# Patient Record
Sex: Male | Born: 1970 | Race: Black or African American | Hispanic: No | Marital: Single | Smoking: Current every day smoker
Health system: Southern US, Community
[De-identification: ages and names within clinical notes are randomized; demographics above are authoritative.]

## PROBLEM LIST (undated history)

## (undated) DIAGNOSIS — R569 Unspecified convulsions: Secondary | ICD-10-CM

## (undated) DIAGNOSIS — F1010 Alcohol abuse, uncomplicated: Secondary | ICD-10-CM

## (undated) DIAGNOSIS — F10939 Alcohol use, unspecified with withdrawal, unspecified: Secondary | ICD-10-CM

## (undated) DIAGNOSIS — F10239 Alcohol dependence with withdrawal, unspecified: Secondary | ICD-10-CM

## (undated) HISTORY — PX: TONSILLECTOMY: SUR1361

---

## 2001-03-16 ENCOUNTER — Emergency Department (HOSPITAL_COMMUNITY): Admission: EM | Admit: 2001-03-16 | Discharge: 2001-03-16 | Payer: Self-pay | Admitting: Emergency Medicine

## 2001-03-16 ENCOUNTER — Encounter: Payer: Self-pay | Admitting: Emergency Medicine

## 2011-11-29 ENCOUNTER — Encounter (HOSPITAL_COMMUNITY): Payer: Self-pay | Admitting: *Deleted

## 2011-11-29 ENCOUNTER — Emergency Department (HOSPITAL_COMMUNITY)

## 2011-11-29 ENCOUNTER — Emergency Department (HOSPITAL_COMMUNITY)
Admission: EM | Admit: 2011-11-29 | Discharge: 2011-11-29 | Disposition: A | Discharge status: Home / Self Care | Attending: Emergency Medicine | Admitting: Emergency Medicine

## 2011-11-29 DIAGNOSIS — F10239 Alcohol dependence with withdrawal, unspecified: Secondary | ICD-10-CM

## 2011-11-29 DIAGNOSIS — F10939 Alcohol use, unspecified with withdrawal, unspecified: Secondary | ICD-10-CM | POA: Insufficient documentation

## 2011-11-29 DIAGNOSIS — F17200 Nicotine dependence, unspecified, uncomplicated: Secondary | ICD-10-CM | POA: Insufficient documentation

## 2011-11-29 DIAGNOSIS — R569 Unspecified convulsions: Secondary | ICD-10-CM | POA: Insufficient documentation

## 2011-11-29 LAB — BASIC METABOLIC PANEL
BUN: 13 mg/dL (ref 6–23)
CO2: 28 mEq/L (ref 19–32)
Calcium: 9.2 mg/dL (ref 8.4–10.5)
Chloride: 98 mEq/L (ref 96–112)
Creatinine, Ser: 0.93 mg/dL (ref 0.50–1.35)
GFR calc Af Amer: >90 mL/min (ref >90)
GFR calc non Af Amer: >90 mL/min (ref >90)
Glucose, Bld: 106 mg/dL — ABNORMAL HIGH (ref 70–99)
Potassium: 3.6 mEq/L (ref 3.5–5.1)
Sodium: 134 mEq/L — ABNORMAL LOW (ref 135–145)

## 2011-11-29 MED ORDER — LORAZEPAM 1 MG PO TABS: ORAL_TABLET | ORAL | Status: DC

## 2011-11-29 MED ORDER — LORAZEPAM 1 MG PO TABS
ORAL_TABLET | ORAL | Status: AC
  Filled 2011-11-29: qty 1

## 2011-11-29 MED ORDER — LORAZEPAM 1 MG PO TABS
1.0000 mg | ORAL_TABLET | Freq: Once | ORAL | Status: AC
  Administered 2011-11-29: 1 mg via ORAL

## 2011-11-29 MED ORDER — LORAZEPAM 1 MG PO TABS
1.0000 mg | ORAL_TABLET | Freq: Once | ORAL | Status: AC
  Administered 2011-11-29: 1 mg via ORAL
  Filled 2011-11-29: qty 1

## 2011-11-29 NOTE — ED Notes (Signed)
Left in c/o officer for return transport to jail.  Alert, in no distress; verbalizes understanding of instructions given.

## 2011-11-29 NOTE — ED Notes (Signed)
Pt to department via EMS.  Sheriff's deputy accompanied patient.  Pt reports he had a seizure.   Per deputy, seizure was witnessed, and lasted approximately 60- 90 seconds.   Per pt the last time he had a seizure it was due to not having anything to drink.  Pt reports this was in December.  Reports drinking "about a 12 pack" per day.  Last time he drank was 2 days ago.

## 2011-11-29 NOTE — Discharge Instructions (Signed)
Please take the medications exactly as prescribed, return to the hospital for recurrent seizures or any change in mental status including confusion or hallucinations.  You should take the Ativan tablet every 12 hours for 2 days, then half of a tablet every 12 hours for another 2 days, then half of a tablet as needed for anxiety, tremor, sweating or alcohol withdrawal

## 2011-11-29 NOTE — ED Provider Notes (Signed)
History     CSN: [id number]  Arrival date & time 11/29/11  0018   First MD Initiated Contact with Patient 11/29/11 0055      Chief Complaint  Patient presents with  . Seizures    (Consider location/radiation/quality/duration/timing/severity/associated sxs/prior treatment) HPI Comments: Currently the patient is in jail, there was a reported seizure this evening. The patient does not remember having seizure activity but was witnessed by the [facility]. According to the [facility] this was tonic clonic, resolve spontaneously after approximately 60-90 seconds and the patient had some post seizure confusion. He states that he is a heavy drinker, approximately 12+ alcoholic beverages per day usually beers. He has not had anything to drink in the last 2 days because he has been incarcerated. He states that yesterday he was having the shakes and felt like he was in withdrawal, this has improved today but he had a spontaneous seizure this evening. There was no tongue biting, no urinary incontinence, seizure resolved spontaneously. He does admit to having a seizure in the past which was thought to be alcohol withdrawal as well.  Prior to the seizure the patient denies any headache, numbness, weakness, slurred speech, chest pain, abdominal pain, palpitations, swelling, rashes, diarrhea or dysuria.  Patient is a 41 y.o. male presenting with seizures. The history is provided by the patient and the police.  Seizures     Past Medical History  Diagnosis Date  . Seizures     History reviewed. No pertinent past surgical history.  History reviewed. No pertinent family history.  History  Substance Use Topics  . Smoking status: Current Everyday Smoker -- 1.0 packs/day  . Smokeless tobacco: Not on file  . Alcohol Use: 7.2 oz/week    12 Cans of beer per week     reprots 12 pack per day      Review of Systems  Neurological: Positive for seizures.  All other systems reviewed and are  negative.    Allergies  Review of patient's allergies indicates no known allergies.  Home Medications   Current Outpatient Rx  Name Route Sig Dispense Refill  . LORAZEPAM 1 MG PO TABS  Take 1 tablet by mouth every 12 hours for 2 days, take half a tablet by mouth every 12 hours for 2 days, then take a half a tablet as needed for anxiety, tremor or alcohol withdrawal 10 tablet 0    BP 145/87  Pulse 68  Temp(Src) 98.2 F (36.8 C) (Oral)  Resp 16  Ht 5\' 6"  (1.676 m)  Wt 147 lb (66.679 kg)  BMI 23.73 kg/m2  SpO2 98%  Physical Exam  Nursing note and vitals reviewed. Constitutional: He appears well-developed and well-nourished. No distress.  HENT:  Head: Normocephalic and atraumatic.  Mouth/Throat: Oropharynx is clear and moist. No oropharyngeal exudate.  Eyes: Conjunctivae and EOM are normal. Pupils are equal, round, and reactive to light. Right eye exhibits no discharge. Left eye exhibits no discharge. No scleral icterus.  Neck: Normal range of motion. Neck supple. No JVD present. No thyromegaly present.  Cardiovascular: Normal rate, regular rhythm, normal heart sounds and intact distal pulses.  Exam reveals no gallop and no friction rub.   No murmur heard. Pulmonary/Chest: Effort normal and breath sounds normal. No respiratory distress. He has no wheezes. He has no rales.  Abdominal: Soft. Bowel sounds are normal. He exhibits no distension and no mass. There is no tenderness.  Musculoskeletal: Normal range of motion. He exhibits no edema and no tenderness.  Lymphadenopathy:  He has no cervical adenopathy.  Neurological: He is alert. Coordination normal.       Normal speech, sensation, motor, limb movements, no ataxia. Normal pupillary exam, normal extraocular movements.  Skin: Skin is warm and dry. No rash noted. No erythema.  Psychiatric: He has a normal mood and affect. His behavior is normal.    ED Course  Procedures (including critical care time)  Labs Reviewed   BASIC METABOLIC PANEL - Abnormal; Notable for the following:    Sodium 134 (*)    Glucose, Bld 106 (*)    All other components within normal limits   No results found.   1. Alcohol withdrawal seizure       MDM  No signs of trauma, evidently the patient had rolled off of his mattress which is only several inches off the floor to the ground where he had a shaking spell. His mental status is normal, neurologic exam is normal, vital signs are normal. Proceed with CT scan as there is no documented history of the CT scan of the medical record in the last 11 years. Basic metabolic panel pending, Ativan given by mouth. I suspect this was alcohol withdrawal in nature.  CT scan reviewed by myself, no acute findings to suggest another source for his seizure. Basic metabolic panel is normal, medications have been given and no further seizures have been seen. There is no signs of delirium, altered mental status confusion or disorientation to suggest delirium tremens. He no longer has any tremor, sweating or anxiety. Will treat with short course of benzodiazepine taper, followup as needed.  Well-appearing, no further seizures, vital signs normal, prescription for Ativan given.  Discharged in the care of the [facility], MD 11/29/11 [phone]

## 2011-11-29 NOTE — ED Notes (Signed)
Hx of ETOH abuse-states drinks approx 12 beers daily; has been incarcerated x 2 days; reports hx of seizures, last one in December.  States cannot remember if that seizure was r/t ETOH withdrawal. Alert, oriented x 4; answers questions appropriately.

## 2013-09-06 ENCOUNTER — Encounter (HOSPITAL_COMMUNITY): Payer: Self-pay | Admitting: Emergency Medicine

## 2013-09-06 ENCOUNTER — Emergency Department (HOSPITAL_COMMUNITY)

## 2013-09-06 ENCOUNTER — Other Ambulatory Visit: Payer: Self-pay

## 2013-09-06 ENCOUNTER — Emergency Department (HOSPITAL_COMMUNITY)
Admission: EM | Admit: 2013-09-06 | Discharge: 2013-09-06 | Disposition: A | Discharge status: Home / Self Care | Attending: Emergency Medicine | Admitting: Emergency Medicine

## 2013-09-06 DIAGNOSIS — G40909 Epilepsy, unspecified, not intractable, without status epilepticus: Secondary | ICD-10-CM | POA: Insufficient documentation

## 2013-09-06 DIAGNOSIS — Z79899 Other long term (current) drug therapy: Secondary | ICD-10-CM | POA: Insufficient documentation

## 2013-09-06 DIAGNOSIS — R55 Syncope and collapse: Secondary | ICD-10-CM | POA: Insufficient documentation

## 2013-09-06 DIAGNOSIS — R61 Generalized hyperhidrosis: Secondary | ICD-10-CM | POA: Insufficient documentation

## 2013-09-06 DIAGNOSIS — F17200 Nicotine dependence, unspecified, uncomplicated: Secondary | ICD-10-CM | POA: Insufficient documentation

## 2013-09-06 DIAGNOSIS — R079 Chest pain, unspecified: Secondary | ICD-10-CM

## 2013-09-06 DIAGNOSIS — R0789 Other chest pain: Secondary | ICD-10-CM | POA: Insufficient documentation

## 2013-09-06 LAB — CBC WITH DIFFERENTIAL/PLATELET
Eosinophils Relative: 3 % (ref 0–5)
HCT: 41.5 % (ref 39.0–52.0)
Lymphocytes Relative: 37 % (ref 12–46)
Lymphs Abs: 1 10*3/uL (ref 0.7–4.0)
MCH: 32 pg (ref 26.0–34.0)
MCHC: 34 g/dL (ref 30.0–36.0)
MCV: 94.1 fL (ref 78.0–100.0)
Monocytes Absolute: 0.6 10*3/uL (ref 0.1–1.0)
Platelets: 169 10*3/uL (ref 150–400)
RBC: 4.41 MIL/uL (ref 4.22–5.81)
WBC: 2.7 10*3/uL — ABNORMAL LOW (ref 4.0–10.5)

## 2013-09-06 LAB — COMPREHENSIVE METABOLIC PANEL
ALT: 81 U/L — ABNORMAL HIGH (ref 0–53)
AST: 112 U/L — ABNORMAL HIGH (ref 0–37)
Alkaline Phosphatase: 91 U/L (ref 39–117)
CO2: 26 mEq/L (ref 19–32)
Calcium: 9.5 mg/dL (ref 8.4–10.5)
Creatinine, Ser: 0.77 mg/dL (ref 0.50–1.35)
GFR calc Af Amer: >90 mL/min (ref >90)
GFR calc non Af Amer: >90 mL/min (ref >90)
Glucose, Bld: 88 mg/dL (ref 70–99)
Potassium: 3.8 mEq/L (ref 3.5–5.1)
Total Bilirubin: 0.9 mg/dL (ref 0.3–1.2)

## 2013-09-06 NOTE — ED Notes (Signed)
Patient on librium and dilantin in correctional facility. Patient states last thing he remembers is going to cell door and asking for help due to chest pain. Officer states seizure like activity. Patient states is a heavy drinker (daily consumption- liquor and beer) and has not had any ETOH since Christmas Day.

## 2013-09-06 NOTE — ED Notes (Signed)
Patient brought in via EMS. Alert and oriented. EMS called out for seizure. Patient unaware of seizure. Per EMS no signs of seizures and no one from prison gave account of seizure. Patient detox from alcohol-per patient last drink Christmas day. Patient c/o left sided chest pain that started yesterday, per patient occasional radiates into left leg. Denies any cardiac hx.

## 2013-09-06 NOTE — ED Notes (Signed)
Report given to office with patient due to nurse not being at work until Monday. Officer and patient verbalizes understanding. Patient in nad

## 2013-09-06 NOTE — ED Provider Notes (Signed)
CSN: [id number]     Arrival date & time 09/06/13  1512 History  This chart was scribed for Benny Lennert, MD by Clydene Laming, ED Scribe. This patient was seen in room APA07/APA07 and the patient's care was started at 4:10 PM.   Chief Complaint  Patient presents with  . Seizures  . Chest Pain    Patient is a 42 y.o. male presenting with chest pain. The history is provided by the patient. No language interpreter was used.  Chest Pain Pain location:  L chest Pain quality: tightness   Pain radiates to:  Does not radiate Pain radiates to the back: no   Pain severity:  Mild Onset quality:  Sudden Duration:  2 days Timing:  Intermittent Progression:  Worsening Chronicity:  New Associated symptoms: no abdominal pain, no back pain, no cough, no fatigue and no headache    HPI Comments: Terry Clark is a 42 y.o. male brought in by ambulance, who presents to the Emergency Department complaining of intermittent, worsening left sided chest pain onset yesterday. Pt states the episodes last 10-15 seconds occur every 5-10 minutes. The chest tightnesss led to an episode of syncope which pt can net specify the duration of. Pt states he has been "sweating all day". EMS were called out for patients seizure episode. Patient was unaware of seizure. Per EMS no signs of seizures and no one from prison gave account of seizure. Patient detox from alcohol-per patient last drink Christmas day. Pt has medical hx of seizures.     Past Medical History  Diagnosis Date  . Seizures    Past Surgical History  Procedure Laterality Date  . Tonsillectomy     No family history on file. History  Substance Use Topics  . Smoking status: Current Every Day Smoker -- 1.00 packs/day  . Smokeless tobacco: Not on file  . Alcohol Use: 7.2 oz/week    12 Cans of beer per week     Comment: reprots 12 pack per day    Review of Systems  Constitutional: Negative for appetite change and fatigue.  HENT: Negative for  congestion, ear discharge and sinus pressure.   Eyes: Negative for discharge.  Respiratory: Negative for cough.   Cardiovascular: Positive for chest pain.  Gastrointestinal: Negative for abdominal pain and diarrhea.  Genitourinary: Negative for frequency and hematuria.  Musculoskeletal: Negative for back pain.  Skin: Negative for rash.  Neurological: Positive for seizures and syncope. Negative for headaches.  Psychiatric/Behavioral: Negative for hallucinations.    Allergies  Review of patient's allergies indicates no known allergies.  Home Medications   Current Outpatient Rx  Name  Route  Sig  Dispense  Refill  . chlordiazePOXIDE (LIBRIUM) 25 MG capsule   Oral   Take 50 mg by mouth 2 (two) times daily.         . Multiple Vitamin (MULTIVITAMIN WITH MINERALS) TABS tablet   Oral   Take 1 tablet by mouth daily.         . phenytoin (DILANTIN) 300 MG ER capsule   Oral   Take 300 mg by mouth at bedtime.         . thiamine 100 MG tablet   Oral   Take 100 mg by mouth daily.          BP 123/75  Pulse 62  Temp(Src) 98.6 F (37 C) (Oral)  Resp 18  SpO2 99% Physical Exam  Constitutional: He is oriented to person, place, and time. He appears well-developed.  HENT:  Head: Normocephalic.  Eyes: Conjunctivae and EOM are normal. No scleral icterus.  Neck: Neck supple. No thyromegaly present.  Cardiovascular: Normal rate and regular rhythm.  Exam reveals no gallop and no friction rub.   No murmur heard. Pulmonary/Chest: No stridor. He has no wheezes. He has no rales. He exhibits no tenderness.  Abdominal: He exhibits no distension. There is no tenderness. There is no rebound.  Musculoskeletal: Normal range of motion. He exhibits no edema.  Lymphadenopathy:    He has no cervical adenopathy.  Neurological: He is oriented to person, place, and time. He exhibits normal muscle tone. Coordination normal.  Skin: No rash noted. No erythema.  Psychiatric: He has a normal mood and  affect. His behavior is normal.    ED Course  Procedures (including critical care time) DIAGNOSTIC STUDIES: Oxygen Saturation is 99% on RA, normal by my interpretation.    COORDINATION OF CARE: 4:15 PM- Discussed treatment plan with pt at bedside. Pt verbalized understanding and agreement with plan.   Labs Review Labs Reviewed - No data to display Imaging Review No results found.  EKG Interpretation   None       MDM  No diagnosis found. I personally performed the services described in this documentation, which was scribed in my presence. The recorded information has been reviewed and is accurate.     Benny Lennert, MD 09/06/13 [id number]

## 2014-06-13 ENCOUNTER — Encounter (HOSPITAL_COMMUNITY): Payer: Self-pay | Admitting: Emergency Medicine

## 2014-06-13 ENCOUNTER — Inpatient Hospital Stay (HOSPITAL_COMMUNITY)
Admission: EM | Admit: 2014-06-13 | Discharge: 2014-06-14 | DRG: 101 | Disposition: A | Discharge status: Home / Self Care | Attending: Internal Medicine | Admitting: Internal Medicine

## 2014-06-13 DIAGNOSIS — R569 Unspecified convulsions: Principal | ICD-10-CM | POA: Diagnosis present

## 2014-06-13 DIAGNOSIS — K7010 Alcoholic hepatitis without ascites: Secondary | ICD-10-CM | POA: Diagnosis not present

## 2014-06-13 DIAGNOSIS — R112 Nausea with vomiting, unspecified: Secondary | ICD-10-CM | POA: Diagnosis present

## 2014-06-13 DIAGNOSIS — R51 Headache: Secondary | ICD-10-CM | POA: Diagnosis present

## 2014-06-13 DIAGNOSIS — F10230 Alcohol dependence with withdrawal, uncomplicated: Secondary | ICD-10-CM

## 2014-06-13 DIAGNOSIS — F17210 Nicotine dependence, cigarettes, uncomplicated: Secondary | ICD-10-CM | POA: Diagnosis present

## 2014-06-13 DIAGNOSIS — K5900 Constipation, unspecified: Secondary | ICD-10-CM | POA: Diagnosis present

## 2014-06-13 DIAGNOSIS — F1290 Cannabis use, unspecified, uncomplicated: Secondary | ICD-10-CM | POA: Diagnosis present

## 2014-06-13 DIAGNOSIS — F10930 Alcohol use, unspecified with withdrawal, uncomplicated: Secondary | ICD-10-CM

## 2014-06-13 DIAGNOSIS — F10231 Alcohol dependence with withdrawal delirium: Secondary | ICD-10-CM | POA: Diagnosis present

## 2014-06-13 DIAGNOSIS — F10939 Alcohol use, unspecified with withdrawal, unspecified: Secondary | ICD-10-CM | POA: Diagnosis present

## 2014-06-13 DIAGNOSIS — F10239 Alcohol dependence with withdrawal, unspecified: Secondary | ICD-10-CM | POA: Diagnosis present

## 2014-06-13 LAB — CBC WITH DIFFERENTIAL/PLATELET
BASOS ABS: 0 10*3/uL (ref 0.0–0.1)
Basophils Relative: 0 % (ref 0–1)
EOS ABS: 0 10*3/uL (ref 0.0–0.7)
EOS PCT: 0 % (ref 0–5)
HCT: 37.2 % — ABNORMAL LOW (ref 39.0–52.0)
Hemoglobin: 12.9 g/dL — ABNORMAL LOW (ref 13.0–17.0)
Lymphocytes Relative: 9 % — ABNORMAL LOW (ref 12–46)
Lymphs Abs: 0.4 10*3/uL — ABNORMAL LOW (ref 0.7–4.0)
MCH: 30.9 pg (ref 26.0–34.0)
MCHC: 34.7 g/dL (ref 30.0–36.0)
MCV: 89 fL (ref 78.0–100.0)
Monocytes Absolute: 1 10*3/uL (ref 0.1–1.0)
Monocytes Relative: 24 % — ABNORMAL HIGH (ref 3–12)
NEUTROS PCT: 67 % (ref 43–77)
Neutro Abs: 2.7 10*3/uL (ref 1.7–7.7)
PLATELETS: 123 10*3/uL — AB (ref 150–400)
RBC: 4.18 MIL/uL — ABNORMAL LOW (ref 4.22–5.81)
RDW: 14.2 % (ref 11.5–15.5)
WBC: 4.1 10*3/uL (ref 4.0–10.5)

## 2014-06-13 LAB — COMPREHENSIVE METABOLIC PANEL
ALT: 156 U/L — AB (ref 0–53)
AST: 305 U/L — AB (ref 0–37)
Albumin: 3.7 g/dL (ref 3.5–5.2)
Alkaline Phosphatase: 205 U/L — ABNORMAL HIGH (ref 39–117)
Anion gap: 19 — ABNORMAL HIGH (ref 5–15)
BILIRUBIN TOTAL: 0.5 mg/dL (ref 0.3–1.2)
BUN: 12 mg/dL (ref 6–23)
CHLORIDE: 92 meq/L — AB (ref 96–112)
CO2: 25 mEq/L (ref 19–32)
CREATININE: 0.62 mg/dL (ref 0.50–1.35)
Calcium: 9.1 mg/dL (ref 8.4–10.5)
GFR calc non Af Amer: >90 mL/min (ref >90)
GLUCOSE: 128 mg/dL — AB (ref 70–99)
Potassium: 3.7 mEq/L (ref 3.7–5.3)
Sodium: 136 mEq/L — ABNORMAL LOW (ref 137–147)
Total Protein: 8.1 g/dL (ref 6.0–8.3)

## 2014-06-13 MED ORDER — LORAZEPAM 1 MG PO TABS: 0.0000 mg | ORAL_TABLET | Freq: Two times a day (BID) | ORAL | Status: DC

## 2014-06-13 MED ORDER — DOCUSATE SODIUM 100 MG PO CAPS: 100.0000 mg | ORAL_CAPSULE | Freq: Every day | ORAL | Status: DC | PRN

## 2014-06-13 MED ORDER — ONDANSETRON HCL 4 MG/2ML IJ SOLN: 4.0000 mg | Freq: Four times a day (QID) | INTRAMUSCULAR | Status: DC | PRN

## 2014-06-13 MED ORDER — SODIUM CHLORIDE 0.9 % IV SOLN
Freq: Once | INTRAVENOUS | Status: AC
Start: 2014-06-13 — End: 2014-06-13
  Administered 2014-06-13: 14:00:00 via INTRAVENOUS

## 2014-06-13 MED ORDER — LORAZEPAM 2 MG/ML IJ SOLN
1.0000 mg | Freq: Four times a day (QID) | INTRAMUSCULAR | Status: DC | PRN
  Administered 2014-06-13: 1 mg via INTRAVENOUS
  Filled 2014-06-13: qty 1

## 2014-06-13 MED ORDER — ONDANSETRON HCL 4 MG/2ML IJ SOLN
4.0000 mg | Freq: Once | INTRAMUSCULAR | Status: AC
  Administered 2014-06-13: 4 mg via INTRAVENOUS
  Filled 2014-06-13: qty 2

## 2014-06-13 MED ORDER — SODIUM CHLORIDE 0.9 % IJ SOLN
3.0000 mL | Freq: Two times a day (BID) | INTRAMUSCULAR | Status: DC
  Administered 2014-06-13 – 2014-06-14 (×2): 3 mL via INTRAVENOUS

## 2014-06-13 MED ORDER — LORAZEPAM 1 MG PO TABS: 1.0000 mg | ORAL_TABLET | Freq: Four times a day (QID) | ORAL | Status: DC | PRN

## 2014-06-13 MED ORDER — ACETAMINOPHEN 325 MG PO TABS: 650.0000 mg | ORAL_TABLET | Freq: Four times a day (QID) | ORAL | Status: DC | PRN

## 2014-06-13 MED ORDER — ONDANSETRON HCL 4 MG PO TABS: 4.0000 mg | ORAL_TABLET | Freq: Four times a day (QID) | ORAL | Status: DC | PRN

## 2014-06-13 MED ORDER — FOLIC ACID 1 MG PO TABS
1.0000 mg | ORAL_TABLET | Freq: Every day | ORAL | Status: DC
  Administered 2014-06-14: 1 mg via ORAL
  Filled 2014-06-13: qty 1

## 2014-06-13 MED ORDER — ALUM & MAG HYDROXIDE-SIMETH 200-200-20 MG/5ML PO SUSP: 30.0000 mL | Freq: Four times a day (QID) | ORAL | Status: DC | PRN

## 2014-06-13 MED ORDER — ENOXAPARIN SODIUM 40 MG/0.4ML ~~LOC~~ SOLN
40.0000 mg | SUBCUTANEOUS | Status: DC
  Administered 2014-06-13: 40 mg via SUBCUTANEOUS
  Filled 2014-06-13: qty 0.4

## 2014-06-13 MED ORDER — ACETAMINOPHEN 650 MG RE SUPP: 650.0000 mg | Freq: Four times a day (QID) | RECTAL | Status: DC | PRN

## 2014-06-13 MED ORDER — THIAMINE HCL 100 MG/ML IJ SOLN: 100.0000 mg | Freq: Every day | INTRAMUSCULAR | Status: DC

## 2014-06-13 MED ORDER — LORAZEPAM 1 MG PO TABS
0.0000 mg | ORAL_TABLET | Freq: Four times a day (QID) | ORAL | Status: DC
  Administered 2014-06-13 – 2014-06-14 (×3): 1 mg via ORAL
  Filled 2014-06-13 (×3): qty 1

## 2014-06-13 MED ORDER — ADULT MULTIVITAMIN W/MINERALS CH
1.0000 | ORAL_TABLET | Freq: Every day | ORAL | Status: DC
  Administered 2014-06-14: 1 via ORAL
  Filled 2014-06-13: qty 1

## 2014-06-13 MED ORDER — VITAMIN B-1 100 MG PO TABS
100.0000 mg | ORAL_TABLET | Freq: Every day | ORAL | Status: DC
  Administered 2014-06-14: 100 mg via ORAL
  Filled 2014-06-13: qty 1

## 2014-06-13 MED ORDER — LORAZEPAM 1 MG PO TABS
1.0000 mg | ORAL_TABLET | Freq: Once | ORAL | Status: AC
  Administered 2014-06-13: 1 mg via ORAL
  Filled 2014-06-13: qty 1

## 2014-06-13 NOTE — ED Provider Notes (Signed)
CSN: [id number]     Arrival date & time 06/13/14  1332 History  This chart was scribed for Mirian Mo, MD by Leone Payor, ED Scribe. This patient was seen in room APA07/APA07 and the patient's care was started 1:58 PM.    Chief Complaint  Patient presents with  . Seizures  . Delirium Tremens (DTS)    The history is provided by the patient. No language interpreter was used.    HPI Comments: Terry Clark is a 43 y.o. male who presents from jail to the Emergency Department complaining of a seizure that occurred earlier today. Patient states he was laying down on a bed when he began feeling unwell. He got up to call someone and the next thing he recalls is waking up on the floor. He reports having a HA, abdominal pain, chin pain upon waking. He reports current nausea and an episode of vomiting in the ED. Patient states he usually drinks three 12-pack of beer daily with his last drinking being yesterday afternoon. He denies having a history of seizures aside from those related to alcohol consumption. He denies diarrhea.    Past Medical History  Diagnosis Date  . Seizures    Past Surgical History  Procedure Laterality Date  . Tonsillectomy     History reviewed. No pertinent family history. History  Substance Use Topics  . Smoking status: Current Every Day Smoker -- 1.00 packs/day  . Smokeless tobacco: Not on file  . Alcohol Use: 7.2 oz/week    12 Cans of beer per week     Comment: reprots 12 pack per day    Review of Systems  Constitutional: Negative for fever.  HENT:       +chin pain  Gastrointestinal: Positive for nausea, vomiting and constipation. Negative for diarrhea.  Neurological: Positive for headaches.  All other systems reviewed and are negative.     Allergies  Review of patient's allergies indicates no known allergies.  Home Medications   Prior to Admission medications   Not on File   BP 137/86  Pulse 84  Temp(Src) 98.2 F (36.8 C) (Oral)  Resp 19   SpO2 96% Physical Exam  Nursing note and vitals reviewed. Constitutional: He is oriented to person, place, and time. He appears well-developed and well-nourished.  HENT:  Head: Normocephalic and atraumatic.  Very superficial lacerations to the bilateral edges of tongue  Cardiovascular: Normal rate, regular rhythm and normal heart sounds.  Exam reveals no gallop and no friction rub.   No murmur heard. Pulmonary/Chest: Effort normal and breath sounds normal. No respiratory distress. He has no wheezes. He has no rales.  Abdominal: Soft. Bowel sounds are normal. He exhibits no distension and no mass. There is no tenderness. There is no rebound and no guarding.  Neurological: He is alert and oriented to person, place, and time. He has normal reflexes. He displays normal reflexes. No cranial nerve deficit. He exhibits normal muscle tone. Coordination normal.  Skin: Skin is warm and dry.  Psychiatric: He has a normal mood and affect.    ED Course  Procedures (including critical care time)  DIAGNOSTIC STUDIES: Oxygen Saturation is 100% on RA, normal by my interpretation.    COORDINATION OF CARE: 2:03 PM Discussed treatment plan with pt at bedside and pt agreed to plan.   Labs Review Labs Reviewed  COMPREHENSIVE METABOLIC PANEL - Abnormal; Notable for the following:    Sodium 136 (*)    Chloride 92 (*)    Glucose, Bld 128 (*)  AST 305 (*)    ALT 156 (*)    Alkaline Phosphatase 205 (*)    Anion gap 19 (*)    All other components within normal limits  CBC WITH DIFFERENTIAL - Abnormal; Notable for the following:    RBC 4.18 (*)    Hemoglobin 12.9 (*)    HCT 37.2 (*)    Platelets 123 (*)    Lymphocytes Relative 9 (*)    Lymphs Abs 0.4 (*)    Monocytes Relative 24 (*)    All other components within normal limits    Imaging Review No results found.   EKG Interpretation None      MDM   Final diagnoses:  Alcohol withdrawal seizure, uncomplicated    43 y.o. male with  pertinent PMH of prior withdrawal seizures presents with recurrent EtOH withdrawal seizure.  Patient drinks approximately 36 beers per day, has not had any alcohol in 24 hours. He had a witnessed seizure lasting 5-10 minutes per police staff.  This was described as generalized with loss of consciousness and followed by a postictal period.  On arrival today vitals signs and physical exam as above. Patient has mild fasciculations and tremor. No tachycardia. He was given Ativan. I spoke with jail staff who state that they're unable to administer medications until Monday.  Consult hospitalist for admission.  1. Alcohol withdrawal seizure, uncomplicated           Mirian MoMatthew Ilze Roselli, MD 06/13/14 1557

## 2014-06-13 NOTE — ED Notes (Signed)
Drinks 3 12 pack of beer per day.  Seizure this am witness for 15 minutes.  [facility]   Having vomiting at this time.

## 2014-06-13 NOTE — H&P (Signed)
History and Physical  Terry Clark:[id number] DOB: [birth year] DOA: 06/13/2014  Referring physician: Dr Littie Deeds, ED physician PCP: Default, Provider, MD   Chief Complaint: Seizure  HPI: Terry Clark is a 43 y.o. male  With a history of alcohol abuse, reportedly drinking and 36 beers a night with his last drink sometime around noon yesterday. The patient was taken into custody at the jail yesterday afternoon due to violation of parole and has been without alcohol since that time. The patient states that he was starting to feel sick and was trying to get some of the tension when he had a loss of consciousness. She was found by jail staff with generalized seizure activity. He was transported to the hospital for evaluation. The ER conversations with the jail staff related to inability to treat him as an outpatient. The jail status related that they did not have a nurse to administer medications for weekend.   Review of Systems:   Pt complains of nausea, vomiting, tremors, anxiousness.  Pt denies any fevers, chills, shortness of breath, palpitations, chest pain, abdominal pain.  Review of systems are otherwise negative  Past Medical History  Diagnosis Date  . Seizures    Past Surgical History  Procedure Laterality Date  . Tonsillectomy     Social History:  reports that he has been smoking.  He does not have any smokeless tobacco history on file. He reports that he drinks about 151.2 ounces of alcohol per week. He reports that he uses illicit drugs (Marijuana). Patient lives at home & is able to participate in activities of daily living  No Known Allergies  History reviewed. No pertinent family history.    Prior to Admission medications   Not on File    Physical Exam: BP 137/86  Pulse 84  Temp(Src) 98.2 F (36.8 C) (Oral)  Resp 19  SpO2 96%  General: Middle-aged white male. Awake and alert and oriented x3. No acute cardiopulmonary distress.  Eyes: Pupils equal,  round, reactive to light. Extraocular muscles are intact. Sclerae anicteric and noninjected.  ENT:  Moist mucosal membranes. Hemostatic lacerations noted on the sides of tongue.  No mucosal lesions. Teeth in moderate repair  Neck: Neck supple without lymphadenopathy. No carotid bruits. No masses palpated.  Cardiovascular: Regular rate with normal S1-S2 sounds. No murmurs, rubs, gallops auscultated. No JVD.  Respiratory: Good respiratory effort with no wheezes, rales, rhonchi. Lungs clear to auscultation bilaterally.  Abdomen: Soft, nontender, nondistended. Active bowel sounds. No masses or hepatosplenomegaly  Skin: Dry, warm to touch. 2+ dorsalis pedis and radial pulses. Musculoskeletal: No calf or leg pain. All major joints not erythematous nontender.  Psychiatric: Intact judgment and insight.  Neurologic: Intention tremor, as well as resting tremor. Some fasciculations noted in the arms. Cranial nerves II through XII are grossly intact.           Labs on Admission:  Basic Metabolic Panel:  Recent Labs Lab 06/13/14 1340  NA 136*  K 3.7  CL 92*  CO2 25  GLUCOSE 128*  BUN 12  CREATININE 0.62  CALCIUM 9.1   Liver Function Tests:  Recent Labs Lab 06/13/14 1340  AST 305*  ALT 156*  ALKPHOS 205*  BILITOT 0.5  PROT 8.1  ALBUMIN 3.7   No results found for this basename: LIPASE, AMYLASE,  in the last 168 hours No results found for this basename: AMMONIA,  in the last 168 hours CBC:  Recent Labs Lab 06/13/14 1340  WBC 4.1  NEUTROABS 2.7  HGB 12.9*  HCT 37.2*  MCV 89.0  PLT 123*   Cardiac Enzymes: No results found for this basename: CKTOTAL, CKMB, CKMBINDEX, TROPONINI,  in the last 168 hours  BNP (last 3 results) No results found for this basename: PROBNP,  in the last 8760 hours CBG: No results found for this basename: GLUCAP,  in the last 168 hours  Radiological Exams on Admission: No results found.    Assessment/Plan Present on Admission:  . Alcohol  withdrawal seizure . Alcoholic hepatitis  #1 alcohol withdrawal seizure Admit with telemetry. We'll give the patient scheduled Ativan due to acute withdrawal, as well as when necessary medications. We'll continue to give the patient symptomatic relief with Zofran. We'll give the patient thiamine and folic acid. I did discuss with the patient's regarding the need to quit, particularly as the patient has alcoholic hepatitis.  #2 alcohol hepatitis  DVT prophylaxis: Lovenox  Consultants: None  Code Status: Full  Family Communication: None   Disposition Plan: Return to jail after treatment  Time spent: 50 minutes  Candelaria CelesteJacob Stinson, DO [facility] Pager [phone]  **Disclaimer: This note may have been dictated with voice recognition software. Similar sounding words can inadvertently be transcribed and this note may contain transcription errors which may not have been corrected upon publication of note.**

## 2014-06-13 NOTE — ED Notes (Signed)
MD at bedside. 

## 2014-06-14 DIAGNOSIS — F10230 Alcohol dependence with withdrawal, uncomplicated: Secondary | ICD-10-CM | POA: Diagnosis not present

## 2014-06-14 DIAGNOSIS — K7010 Alcoholic hepatitis without ascites: Secondary | ICD-10-CM | POA: Diagnosis not present

## 2014-06-14 LAB — HEPATIC FUNCTION PANEL
ALBUMIN: 3.1 g/dL — AB (ref 3.5–5.2)
ALK PHOS: 192 U/L — AB (ref 39–117)
ALT: 127 U/L — ABNORMAL HIGH (ref 0–53)
AST: 221 U/L — AB (ref 0–37)
BILIRUBIN TOTAL: 0.5 mg/dL (ref 0.3–1.2)
Total Protein: 7.3 g/dL (ref 6.0–8.3)

## 2014-06-14 LAB — CBC
HCT: 37.3 % — ABNORMAL LOW (ref 39.0–52.0)
HEMOGLOBIN: 12.8 g/dL — AB (ref 13.0–17.0)
MCH: 30.9 pg (ref 26.0–34.0)
MCHC: 34.3 g/dL (ref 30.0–36.0)
MCV: 90.1 fL (ref 78.0–100.0)
Platelets: 103 10*3/uL — ABNORMAL LOW (ref 150–400)
RBC: 4.14 MIL/uL — AB (ref 4.22–5.81)
RDW: 14.1 % (ref 11.5–15.5)
WBC: 2.2 10*3/uL — ABNORMAL LOW (ref 4.0–10.5)

## 2014-06-14 LAB — BASIC METABOLIC PANEL
Anion gap: 14 (ref 5–15)
BUN: 9 mg/dL (ref 6–23)
CHLORIDE: 97 meq/L (ref 96–112)
CO2: 29 mEq/L (ref 19–32)
Calcium: 8.9 mg/dL (ref 8.4–10.5)
Creatinine, Ser: 0.73 mg/dL (ref 0.50–1.35)
GFR calc Af Amer: >90 mL/min (ref >90)
GLUCOSE: 83 mg/dL (ref 70–99)
Potassium: 3.3 mEq/L — ABNORMAL LOW (ref 3.7–5.3)
Sodium: 140 mEq/L (ref 137–147)

## 2014-06-14 LAB — PROTIME-INR
INR: 0.86 (ref 0.00–1.49)
Prothrombin Time: 11.7 seconds (ref 11.6–15.2)

## 2014-06-14 MED ORDER — POTASSIUM CHLORIDE CRYS ER 20 MEQ PO TBCR
40.0000 meq | EXTENDED_RELEASE_TABLET | Freq: Two times a day (BID) | ORAL | Status: DC
  Administered 2014-06-14: 40 meq via ORAL
  Filled 2014-06-14: qty 2

## 2014-06-14 NOTE — Progress Notes (Signed)
Patient discharge teaching given, including activity, diet, follow-up appoints, and medications. Patient verbalized understanding of all discharge instructions. IV access was d/c'd. Vitals are stable. Skin is intact except as charted in most recent assessments. Pt to be escorted out by NT, to be driven home by family. Bathsheba Durrett D, RN 

## 2014-06-14 NOTE — Discharge Summary (Signed)
Physician Discharge Summary  Gardiner RhymeJonathan XXXSwann WGN:[id number]:[id number] DOB: 02-25-1971 DOA: 06/13/2014  PCP: Default, Provider, MD  Admit date: 06/13/2014 Discharge date: 06/14/2014  Time spent: 35 minutes  Recommendations for Outpatient Follow-up:  1. Follow up with PCP in 1-2 weeks  Discharge Diagnoses:  Active Problems:   Alcohol withdrawal seizure   Alcoholic hepatitis  Discharge Condition: Stable  Diet recommendation: Regular  Filed Weights   06/13/14 1750  Weight: 62.823 kg (138 lb 8 oz)    History of present illness:  See admit h and p from 10/3 for details. Briefly, pt with a heavy ETOH abuse history who presents with seizures after being taken into custody for violation of parole. The patient was brought to the ED and ultimately admitted for continued observation.  Hospital Course:  The patient was admitted to the floor. Given strong hx of ETOH abuse, pt's seizures are strongly believed to be ETOH withdrawal related. He remained seizure free and stable and not in DT's. The patient was noted to have elevated LFT's on admit, thought secondary to ETOH abuse. LFT's trended down. The patient will be discharged in stable condition.  Discharge Exam: Filed Vitals:   06/13/14 1750 06/13/14 2258 06/14/14 0547 06/14/14 1314  BP:  131/74 131/94 147/91  Pulse:  87 77 74  Temp:  99.6 F (37.6 C) 98.7 F (37.1 C) 98.8 F (37.1 C)  TempSrc:  Oral Oral Oral  Resp:  19 19 20   Height: 5\' 7"  (1.702 m)     Weight: 62.823 kg (138 lb 8 oz)     SpO2:  100% 98% 99%    General: Awake, in nad Cardiovascular: regular, s1, s2 Respiratory: normal resp effort, no wheezing  Discharge Instructions     Medication List    Notice   You have not been prescribed any medications.     No Known Allergies Follow-up Information   Follow up with Follow up with your PCP in 1-2 weeks.      Schedule an appointment as soon as possible for a visit in 1 week to follow up.       The results of  significant diagnostics from this hospitalization (including imaging, microbiology, ancillary and laboratory) are listed below for reference.    Significant Diagnostic Studies: No results found.  Microbiology: No results found for this or any previous visit (from the past 240 hour(s)).   Labs: Basic Metabolic Panel:  Recent Labs Lab 06/13/14 1340 06/14/14 0539  NA 136* 140  K 3.7 3.3*  CL 92* 97  CO2 25 29  GLUCOSE 128* 83  BUN 12 9  CREATININE 0.62 0.73  CALCIUM 9.1 8.9   Liver Function Tests:  Recent Labs Lab 06/13/14 1340 06/14/14 0539  AST 305* 221*  ALT 156* 127*  ALKPHOS 205* 192*  BILITOT 0.5 0.5  PROT 8.1 7.3  ALBUMIN 3.7 3.1*   No results found for this basename: LIPASE, AMYLASE,  in the last 168 hours No results found for this basename: AMMONIA,  in the last 168 hours CBC:  Recent Labs Lab 06/13/14 1340 06/14/14 0539  WBC 4.1 2.2*  NEUTROABS 2.7  --   HGB 12.9* 12.8*  HCT 37.2* 37.3*  MCV 89.0 90.1  PLT 123* 103*   Cardiac Enzymes: No results found for this basename: CKTOTAL, CKMB, CKMBINDEX, TROPONINI,  in the last 168 hours BNP: BNP (last 3 results) No results found for this basename: PROBNP,  in the last 8760 hours CBG: No results found for this basename: GLUCAP,  in the last 168 hours  Signed:  CHIU, STEPHEN K  [facility] 06/14/2014, 2:13 PM

## 2014-06-14 NOTE — Discharge Instructions (Signed)
Finding Treatment for Alcohol and Drug Addiction It can be hard to find the right place to get professional treatment. Here are some important things to consider:  There are different types of treatment to choose from.  Some programs are live-in (residential) while others are not (outpatient). Sometimes a combination is offered.  No single type of program is right for everyone.  Most treatment programs involve a combination of education, counseling, and a 12-step, spiritually-based approach.  There are non-spiritually based programs (not 12-step).  Some treatment programs are government sponsored. They are geared for patients without private insurance.  Treatment programs can vary in many respects such as:  Cost and types of insurance accepted.  Types of on-site medical services offered.  Length of stay, setting, and size.  Overall philosophy of treatment. A person may need specialized treatment or have needs not addressed by all programs. For example, adolescents need treatment appropriate for their age. Other people have secondary disorders that must be managed as well. Secondary conditions can include mental illness, such as depression or diabetes. Often, a period of detoxification from alcohol or drugs is needed. This requires medical supervision and not all programs offer this. THINGS TO CONSIDER WHEN SELECTING A TREATMENT PROGRAM   Is the program certified by the appropriate government agency? Even private programs must be certified and employ certified professionals.  Does the program accept your insurance? If not, can a payment plan be set up?  Is the facility clean, organized, and well run? Do they allow you to speak with graduates who can share their treatment experience with you? Can you tour the facility? Can you meet with staff?  Does the program meet the full range of individual needs?  Does the treatment program address sexual orientation and physical disabilities?  Do they provide age, gender, and culturally appropriate treatment services?  Is treatment available in languages other than English?  Is long-term aftercare support or guidance encouraged and provided?  Is assessment of an individual's treatment plan ongoing to ensure it meets changing needs?  Does the program use strategies to encourage reluctant patients to remain in treatment long enough to increase the likelihood of success?  Does the program offer counseling (individual or group) and other behavioral therapies?  Does the program offer medicine as part of the treatment regimen, if needed?  Is there ongoing monitoring of possible relapse? Is there a defined relapse prevention program? Are services or referrals offered to family members to ensure they understand addiction and the recovery process? This would help them support the recovering individual.  Are 12-step meetings held at the center or is transport available for patients to attend outside meetings? In countries outside of the [place], see local directories for contact information for services in your area. Document Released: 07/27/2005 Document Revised: 11/20/2011 Document Reviewed: 02/06/2008 Exit[employer] Patient Information 2015 [employer]. This information is not intended to replace advice given to you by your health care provider. Make sure you discuss any questions you have with your health care provider.  

## 2014-06-17 NOTE — Progress Notes (Signed)
Utilization review completed.  

## 2014-10-10 ENCOUNTER — Emergency Department: Payer: Self-pay | Admitting: Emergency Medicine

## 2014-10-10 LAB — ETHANOL: Ethanol: 402 mg/dL

## 2014-10-11 ENCOUNTER — Emergency Department (HOSPITAL_COMMUNITY)

## 2014-10-11 ENCOUNTER — Emergency Department (HOSPITAL_COMMUNITY)
Admission: EM | Admit: 2014-10-11 | Discharge: 2014-10-11 | Disposition: A | Discharge status: Home / Self Care | Attending: Emergency Medicine | Admitting: Emergency Medicine

## 2014-10-11 ENCOUNTER — Encounter (HOSPITAL_COMMUNITY): Payer: Self-pay | Admitting: Emergency Medicine

## 2014-10-11 DIAGNOSIS — R079 Chest pain, unspecified: Secondary | ICD-10-CM | POA: Diagnosis present

## 2014-10-11 DIAGNOSIS — R0789 Other chest pain: Secondary | ICD-10-CM | POA: Diagnosis not present

## 2014-10-11 DIAGNOSIS — Z720 Tobacco use: Secondary | ICD-10-CM | POA: Insufficient documentation

## 2014-10-11 HISTORY — DX: Alcohol use, unspecified with withdrawal, unspecified: F10.939

## 2014-10-11 HISTORY — DX: Alcohol dependence with withdrawal, unspecified: F10.239

## 2014-10-11 HISTORY — DX: Unspecified convulsions: R56.9

## 2014-10-11 HISTORY — DX: Alcohol abuse, uncomplicated: F10.10

## 2014-10-11 LAB — CBC WITH DIFFERENTIAL/PLATELET
Basophils Absolute: 0.1 10*3/uL (ref 0.0–0.1)
Basophils Relative: 3 % — ABNORMAL HIGH (ref 0–1)
EOS PCT: 1 % (ref 0–5)
Eosinophils Absolute: 0.1 10*3/uL (ref 0.0–0.7)
HCT: 38.3 % — ABNORMAL LOW (ref 39.0–52.0)
Hemoglobin: 12.6 g/dL — ABNORMAL LOW (ref 13.0–17.0)
LYMPHS PCT: 32 % (ref 12–46)
Lymphs Abs: 1.2 10*3/uL (ref 0.7–4.0)
MCH: 30.3 pg (ref 26.0–34.0)
MCHC: 32.9 g/dL (ref 30.0–36.0)
MCV: 92.1 fL (ref 78.0–100.0)
MONO ABS: 0.6 10*3/uL (ref 0.1–1.0)
MONOS PCT: 17 % — AB (ref 3–12)
Neutro Abs: 1.7 10*3/uL (ref 1.7–7.7)
Neutrophils Relative %: 47 % (ref 43–77)
Platelets: 351 10*3/uL (ref 150–400)
RBC: 4.16 MIL/uL — ABNORMAL LOW (ref 4.22–5.81)
RDW: 15.3 % (ref 11.5–15.5)
WBC: 3.7 10*3/uL — ABNORMAL LOW (ref 4.0–10.5)

## 2014-10-11 LAB — BASIC METABOLIC PANEL
ANION GAP: 8 (ref 5–15)
BUN: 9 mg/dL (ref 6–23)
CALCIUM: 9.2 mg/dL (ref 8.4–10.5)
CO2: 27 mmol/L (ref 19–32)
Chloride: 103 mmol/L (ref 96–112)
Creatinine, Ser: 0.75 mg/dL (ref 0.50–1.35)
Glucose, Bld: 72 mg/dL (ref 70–99)
Potassium: 4.3 mmol/L (ref 3.5–5.1)
Sodium: 138 mmol/L (ref 135–145)

## 2014-10-11 LAB — ETHANOL: ETHANOL LVL: 285 mg/dL

## 2014-10-11 LAB — TROPONIN I: Troponin I: 0.03 ng/mL (ref <0.031)

## 2014-10-11 MED ORDER — LORAZEPAM 1 MG PO TABS
1.0000 mg | ORAL_TABLET | Freq: Once | ORAL | Status: AC
  Administered 2014-10-11: 1 mg via ORAL
  Filled 2014-10-11: qty 1

## 2014-10-11 NOTE — ED Provider Notes (Signed)
CSN: [id number]     Arrival date & time 10/11/14  1436 History   First MD Initiated Contact with Patient 10/11/14 1518     Chief Complaint  Patient presents with  . Chest Pain     HPI Pt was seen at 1530. Per pt, c/o gradual onset and persistence of constant chest "pain" since he "had 2 seizures last night" approximately 2200. Pt states he was evaluated at [facility] ED for same last night and discharged. Pt states the pain continued today, so he came to this ED for re-evaluation. Denies palpitations, no SOB/cough, no back pain, no abd pain, no N/V/D, no fevers, no rash, no reported injury.    Past Medical History  Diagnosis Date  . Alcohol withdrawal seizure   . Alcohol abuse    Past Surgical History  Procedure Laterality Date  . Tonsillectomy     Family History  Problem Relation Age of Onset  . Kidney failure Mother   . Cancer Other    History  Substance Use Topics  . Smoking status: Current Every Day Smoker -- 1.00 packs/day  . Smokeless tobacco: Never Used  . Alcohol Use: 151.2 oz/week    252 Cans of beer per week     Comment:  12 pack per day    Review of Systems ROS: Statement: All systems negative except as marked or noted in the HPI; Constitutional: Negative for fever and chills. ; ; Eyes: Negative for eye pain, redness and discharge. ; ; ENMT: Negative for ear pain, hoarseness, nasal congestion, sinus pressure and sore throat. ; ; Cardiovascular: Negative for palpitations, diaphoresis, dyspnea and peripheral edema. ; ; Respiratory: Negative for cough, wheezing and stridor. ; ; Gastrointestinal: Negative for nausea, vomiting, diarrhea, abdominal pain, blood in stool, hematemesis, jaundice and rectal bleeding. . ; ; Genitourinary: Negative for dysuria, flank pain and hematuria. ; ; Musculoskeletal: +CP. Negative for back pain and neck pain. Negative for swelling and trauma.; ; Skin: Negative for pruritus, rash, abrasions, blisters, bruising and skin lesion.; ; Neuro:  Negative for headache, lightheadedness and neck stiffness. Negative for weakness, altered level of consciousness , altered mental status, extremity weakness, paresthesias, involuntary movement, seizure and syncope.      Allergies  Review of patient's allergies indicates no known allergies.  Home Medications   Prior to Admission medications   Not on File   BP 136/88 mmHg  Pulse 62  Temp(Src) 98.4 F (36.9 C) (Oral)  Resp 13  Ht 5\' 6"  (1.676 m)  Wt 145 lb (65.772 kg)  BMI 23.41 kg/m2  SpO2 97% Physical Exam 1535: Physical examination:  Nursing notes reviewed; Vital signs and O2 SAT reviewed;  Constitutional: Well developed, Well nourished, Well hydrated, In no acute distress; Head:  Normocephalic, atraumatic; Eyes: EOMI, PERRL, No scleral icterus; ENMT: Mouth and pharynx normal, Mucous membranes moist; Neck: Supple, Full range of motion, No lymphadenopathy; Cardiovascular: Regular rate and rhythm, No murmur, rub, or gallop; Respiratory: Breath sounds clear & equal bilaterally, No rales, rhonchi, wheezes.  Speaking full sentences with ease, Normal respiratory effort/excursion; Chest: Nontender, Movement normal; Abdomen: Soft, Nontender, Nondistended, Normal bowel sounds; Genitourinary: No CVA tenderness; Extremities: Pulses normal, No tenderness, No edema, No calf edema or asymmetry.; Neuro: AA&Ox3, Major CN grossly intact.  Speech clear. No gross focal motor or sensory deficits in extremities.; Skin: Color normal, Warm, Dry.; Psych:  Affect flat, poor eye contact.   ED Course  Procedures     EKG Interpretation   Date/Time:  Sunday October 11 2014 14:47:51 EST Ventricular Rate:  59 PR Interval:  143 QRS Duration: 91 QT Interval:  424 QTC Calculation: 420 R Axis:   87 Text Interpretation:  Sinus rhythm Consider left ventricular hypertrophy T  wave abnormality V2 and aVL Early repolarization pattern When compared  with ECG of 09/06/2013 No significant change was found Confirmed  by  [facility]  MD, Nicholos Johns [phone]) on 10/11/2014 3:40:55 PM      MDM  MDM Reviewed: previous chart, nursing note and vitals Reviewed previous: labs and ECG Interpretation: labs, ECG and x-ray     Results for orders placed or performed during the hospital encounter of 10/11/14  Basic metabolic panel  Result Value Ref Range   Sodium 138 135 - 145 mmol/L   Potassium 4.3 3.5 - 5.1 mmol/L   Chloride 103 96 - 112 mmol/L   CO2 27 19 - 32 mmol/L   Glucose, Bld 72 70 - 99 mg/dL   BUN 9 6 - 23 mg/dL   Creatinine, Ser 6.04 0.50 - 1.35 mg/dL   Calcium 9.2 8.4 - 54.0 mg/dL   GFR calc non Af Amer >90 >90 mL/min   GFR calc Af Amer >90 >90 mL/min   Anion gap 8 5 - 15  CBC with Differential  Result Value Ref Range   WBC 3.7 (L) 4.0 - 10.5 K/uL   RBC 4.16 (L) 4.22 - 5.81 MIL/uL   Hemoglobin 12.6 (L) 13.0 - 17.0 g/dL   HCT 98.1 (L) 19.1 - 47.8 %   MCV 92.1 78.0 - 100.0 fL   MCH 30.3 26.0 - 34.0 pg   MCHC 32.9 30.0 - 36.0 g/dL   RDW 29.5 62.1 - 30.8 %   Platelets 351 150 - 400 K/uL   Neutrophils Relative % 47 43 - 77 %   Neutro Abs 1.7 1.7 - 7.7 K/uL   Lymphocytes Relative 32 12 - 46 %   Lymphs Abs 1.2 0.7 - 4.0 K/uL   Monocytes Relative 17 (H) 3 - 12 %   Monocytes Absolute 0.6 0.1 - 1.0 K/uL   Eosinophils Relative 1 0 - 5 %   Eosinophils Absolute 0.1 0.0 - 0.7 K/uL   Basophils Relative 3 (H) 0 - 1 %   Basophils Absolute 0.1 0.0 - 0.1 K/uL  Troponin I  Result Value Ref Range   Troponin I 0.03 <0.031 ng/mL   Dg Chest 2 View 10/11/2014   CLINICAL DATA:  44 year old male with chest pain cough. Headache. Seizures yesterday evening.  EXAM: CHEST  2 VIEW  COMPARISON:  Chest x-ray 09/06/2013.  FINDINGS: Lung volumes are normal. No consolidative airspace disease. No pleural effusions. No pneumothorax. No pulmonary nodule or mass noted. Pulmonary vasculature and the cardiomediastinal silhouette are within normal limits.  IMPRESSION: No radiographic evidence of acute cardiopulmonary disease.    Electronically Signed   By: Trudie Reed M.D.   On: 10/11/2014 15:38    1820:  [facility] ED records received: pt was brought to their ED intoxicated and unable to go to jail until [facility] was under 290; ED note states pt c/o "seizure" but when staff walked in the pt's room he was "twitching his feet with his eyes closed," staff then told him to stop shaking and he did. No "seizure" while in the ED today. VS remain stable.  Doubt PE as cause for symptoms with low risk [place].  Doubt ACS as cause for symptoms with normal troponin and unchanged EKG from previous after 1.5 days of constant symptoms.  Dx and testing d/w pt.  Questions answered.  Verb understanding, agreeable to d/c back to jail with their medical staff f/u.   Samuel Jester, DO 10/13/14 1650

## 2014-10-11 NOTE — ED Notes (Signed)
[facility] reported has received consent fax. Still have not received pt records from [facility]. Pt and EDP aware of delay.

## 2014-10-11 NOTE — Discharge Instructions (Signed)
°Emergency Department Resource Guide °1) Find a Doctor and Pay Out of Pocket °Although you won't have to find out who is covered by your insurance plan, it is a good idea to ask around and get recommendations. You will then need to call the office and see if the doctor you have chosen will accept you as a new patient and what types of options they offer for patients who are self-pay. Some doctors offer discounts or will set up payment plans for their patients who do not have insurance, but you will need to ask so you aren't surprised when you get to your appointment. ° °2) Contact Your Local Health Department °Not all health departments have doctors that can see patients for sick visits, but many do, so it is worth a call to see if yours does. If you don't know where your local health department is, you can check in your phone book. The CDC also has a tool to help you locate your state's health department, and many state websites also have listings of all of their local health departments. ° °3) Find a Walk-in Clinic °If your illness is not likely to be very severe or complicated, you may want to try a walk in clinic. These are popping up all over the country in pharmacies, drugstores, and shopping centers. They're usually staffed by nurse practitioners or physician assistants that have been trained to treat common illnesses and complaints. They're usually fairly quick and inexpensive. However, if you have serious medical issues or chronic medical problems, these are probably not your best option. ° °No Primary Care Doctor: °- Call [facility] at  832-8000 - they can help you locate a primary care doctor that  accepts your insurance, provides certain services, etc. °- Physician Referral Service- 1-800-533-3463 ° °Chronic Pain Problems: °Organization         Address  Phone   Notes  °[facility]  (336) 297-2271 Patients need to be referred by their primary care doctor.  ° °Medication  Assistance: °Organization         Address  Phone   Notes  °[place] [facility] [address] °[place], [place] [address] (336) 641-8030 --Must be a resident of [place] °-- Must have NO insurance coverage whatsoever (no Medicaid/ Medicare, etc.) °-- The pt. MUST have a primary care doctor that directs their care regularly and follows them in the community °  °[facility]  (866) 331-1348   °[employer]  (888) 892-1162   ° °Agencies that provide inexpensive medical care: °Organization         Address  Phone   Notes  °[facility]  (336) 832-8035   °[facility]    (336) 832-7272   °[facility] [address] °[place], [place] [address] (336) 832-4777   °[facility] [address], °[place] (336) 271-4999   °[facility]    (336) 373-0678   °[facility]    (336) 272-1050   °[facility] ° [address], [place] Phone:  (336) 832-4444, Fax:  (336) 832-4440 Hours of Operation:  9 am - 6 pm, M-F.  Also accepts Medicaid/Medicare and self-pay.  °[facility] ° [address], [place] Phone: (336) 832-3150, Fax: (336) 832-3151. Hours of Operation:  8:30 am - 5:30 pm, M-F.  Also accepts Medicaid and self-pay.  °[facility] [place] [address], [place] Phone: (336) 878-6027   °[facility] [address], [place], [place] (336)723-1848, Ext. 123 Mondays & Thursdays: 7-9 AM.  First 15 patients are seen on a first come, first serve basis. °  ° °Medicaid-accepting [place] Providers: ° °Organization         Address  Phone   Notes  °[facility] [address], [place] (336) 641-2100 Also accepts self-pay patients.  °[facility] [address], [place] ° (336) 856-9996   °[facility] [address], [place]  (336) 288-8857   °[facility] [address] [place] [address], [place] (336) 299-7000   °Veita Bland [address], [place]  ° (336) 373-1557 Only accepts [place] Access Medicaid patients after they have their name applied to their card.  ° °Self-Pay (no insurance) in [place]: ° °Organization         Address  Phone   Notes  °Sickle Cell Patients, [facility] [address], [place] (336) 832-1970   °[facility] [address], [place] (336) 832-4400   °[facility] ° [address], [place] (336) 992-4800   °[facility]/Dr. Osei-Bonsu ° [address] [place] [address], [place] or [address], [place] (336) 841-8500 Phone number for both [place] and [place] locations is the same.  °[facility] [address], [place] (336) 299-0000   °[facility] [address] [place] [address], [place] or [address] (336) 852-7530 °(336) 878-2260   °[facility] [address], [place] (336) 350-1642, phone; (336) 294-5005, fax Sees patients 1st and 3rd Saturday of every month.  Must not qualify for public or private insurance (i.e. Medicaid, Medicare, [place] Health Choice, Veterans' Benefits) • Household income should be no more than 200% of the poverty level •The clinic cannot treat you if you are pregnant or think you are pregnant • Sexually transmitted diseases are not treated at the clinic.  ° ° °Dental Care: °Organization         Address  Phone  Notes  °[place] [facility] [address], [place] (336) 641-6152 Accepts children up to age 21 who are enrolled in Medicaid or [place] Health Choice; pregnant women with a Medicaid card; and children who have applied for Medicaid or [place] Health Choice, but were declined, whose parents can pay a reduced fee at time of service.  °[place]  [facility] [place]  [address], [place] (336) 641-7733 Accepts children up to age 21 who are enrolled in Medicaid or [place] Health Choice; pregnant women with a Medicaid card; and children who have applied for Medicaid or [place] Health Choice, but were declined, whose parents can pay a reduced fee at time of service.  °[facility] ° [address], [place] (336) 641-4533 Patients are seen by appointment only. Walk-ins are not accepted. [facility] will see patients 18 years of age and older. °Monday - Tuesday (8am-5pm) °Most Wednesdays (8:30-5pm) °$30 per visit, cash only  °[facility] ° [address], [place] (336) 641-4533 Patients are seen by appointment only. Walk-ins are not accepted. [facility] will see patients 18 years of age and older. °One   Wednesday Evening (Monthly: Volunteer Based).  $30 per visit, cash only  °[facility]  (919) 537-3737 for adults; Children under age 4, call Graduate Pediatric Dentistry at ([phone]) [phone]. Children aged 4-14, please call (919) [phone] to request a pediatric application. ° Dental services are provided in all areas of dental care including fillings, crowns and bridges, complete and partial dentures, implants, gum treatment, root canals, and extractions. Preventive care is also provided. Treatment is provided to both adults and children. °Patients are selected via a lottery and there is often a waiting list. °  °[facility] [address], °[place] ° (336) 763-8833 [email or web address] °  °[facility] [address], [place], [place] (336)723-1848, Ext. 123 Second and Fourth Thursday of each month, opens at 6:30 AM; Clinic ends at 9 AM.  Patients are seen on a first-come first-served basis, and a limited number are seen during each clinic.  ° °[facility] ° [address], [place], [place] (336) 723-7904    Eligibility Requirements °You must have lived in [place], [place], or [place] counties for at least the last three months. °  You cannot be eligible for state or federal sponsored healthcare insurance, including Veterans Administration, Medicaid, or Medicare. °  You generally cannot be eligible for healthcare insurance through your employer.  °  How to apply: °Eligibility screenings are held every Tuesday and Wednesday afternoon from 1:00 pm until 4:00 pm. You do not need an appointment for the interview!  °[facility] [address], [place], [place] 336-631-2330   °[facility]  336-342-8273   °[facility]  336-703-3100   °[facility]  336-570-6415   ° °Behavioral Health Resources in the Community: °Intensive Outpatient Programs °Organization         Address  Phone  Notes  °[facility] [address], [place], [place] 336-878-6098   °[facility] [address], [place], [place] 336-832-9800   °[facility] [address], [place], [place] ° 336-882-2125   °[facility] [address],  °[place], [place] 1-800-853-5163 or 336-641-4981   °Substance Abuse Resources °Organization         Address  Phone  Notes  °[facility]  336-882-2125   °[facility]  336-784-9470   °[facility]  336-285-9073   °[facility]  336-845-3988   °Residential & Outpatient Substance Abuse Program  1-800-659-3381   °Psychological Services °Organization         Address  Phone  Notes  °[facility]  336- 832-9600   °[facility]  336- 378-7881   °[facility] [address], [place] 1-800-853-5163 or 336-641-4981   ° °Mobile Crisis Teams °Organization         Address  Phone  Notes  °[facility], Mobile Crisis Care Unit  1-877-626-1772   °Assertive °[facility] ° [address]  [place], [place] 336-834-9664   °Sharon DeEsch [address] °[place] 336-554-5454   ° °Self-Help/Support Groups °Organization         Address  Phone             Notes  °[facility]  - variety of support groups  336- 373-1402 Call for more information  °Narcotics Anonymous (NA), [facility] [address], °[place]  2 meetings at this location  ° °  Residential Treatment Programs Organization         Address  Phone  Notes  [facility] [address],    [place]  [phone]   [facility]  [address], [place] [phone], [place], [place] [phone]   [facility] [address], [place] [phone] Admissions: 8am-3pm M-F  [facility] [address]    [place], [place] [phone]   [facility] [address], [place], [place] 329-518-8416(929)[phone]   [facility] [address]  [place], [place] [phone]([phone]   [facility] Intensive Outpatient [address], [place], [place] [phone]   [facility] ([facility] [address]  [place], [place] [phone] or [phone]   [facility] ([facility]) [address] [place] [address] [place], [place] [phone] Accepts Medicaid  [facility] [address]  [place] [phone] Substance Abuse/Addiction Treatment   [facility]         Address  Phone  Notes  [facility]  (281)650-2354(888) [phone]   Angie FavaJulie Brannon, PhD [address], [address] [place], [place]   [phone](336) [phone] or (780) [phone](336) [phone]   [facility]   [address] [place], [place] [phone](336) ([phone]   [facility] [address], [place], [place] [phone](336) [phone] Insurance/Medicaid/sponsorship through [facility] [address]                                    [place], [place] [phone]) [phone] Therapy/tele-psych/case    [facility] [address]   [place], [place] [phone](336) [phone]    Dr. Lolly MustacheArfeen  [phone](336) [phone]   [facility] [place]  [employer] [facility] 1) [address], [place] 2) [address], [place] 3)  [address], [place] ([phone](336) ([phone]) [phone](336) [phone]  [phone](336) [phone]   [facility] Child [facility] Hot[facility] 650-471-8107(336) 314-679-9163 or [phone](336) [phone] (After Hours)      Apply moist heat or ice to the area(s) of discomfort, for 15 minutes at a time, several times per day for the next few days.  Do not fall asleep on a heating or ice pack.  Call your regular medical doctor tomorrow to schedule a follow up appointment in the next 2 days.  Return to the Emergency Department immediately if worsening.

## 2014-10-11 NOTE — ED Notes (Addendum)
Pt signed consent for information release. [facility] by ED secretary.
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
## 2018-07-26 ENCOUNTER — Emergency Department
Admission: EM | Admit: 2018-07-26 | Discharge: 2018-07-27 | Disposition: A | Discharge status: Home / Self Care | Payer: No Typology Code available for payment source | Attending: Emergency Medicine | Admitting: Emergency Medicine

## 2018-07-26 ENCOUNTER — Other Ambulatory Visit: Payer: Self-pay

## 2018-07-26 ENCOUNTER — Encounter: Payer: Self-pay | Admitting: Emergency Medicine

## 2018-07-26 DIAGNOSIS — Y999 Unspecified external cause status: Secondary | ICD-10-CM | POA: Diagnosis not present

## 2018-07-26 DIAGNOSIS — F10920 Alcohol use, unspecified with intoxication, uncomplicated: Secondary | ICD-10-CM | POA: Diagnosis not present

## 2018-07-26 DIAGNOSIS — S0990XA Unspecified injury of head, initial encounter: Secondary | ICD-10-CM | POA: Diagnosis present

## 2018-07-26 DIAGNOSIS — F17200 Nicotine dependence, unspecified, uncomplicated: Secondary | ICD-10-CM | POA: Insufficient documentation

## 2018-07-26 DIAGNOSIS — S0181XA Laceration without foreign body of other part of head, initial encounter: Secondary | ICD-10-CM | POA: Insufficient documentation

## 2018-07-26 DIAGNOSIS — Y9389 Activity, other specified: Secondary | ICD-10-CM | POA: Insufficient documentation

## 2018-07-26 DIAGNOSIS — Y92410 Unspecified street and highway as the place of occurrence of the external cause: Secondary | ICD-10-CM | POA: Insufficient documentation

## 2018-07-26 DIAGNOSIS — S27321A Contusion of lung, unilateral, initial encounter: Secondary | ICD-10-CM | POA: Insufficient documentation

## 2018-07-26 DIAGNOSIS — R109 Unspecified abdominal pain: Secondary | ICD-10-CM | POA: Insufficient documentation

## 2018-07-26 MED ORDER — SODIUM CHLORIDE 0.9 % IV BOLUS
1000.0000 mL | Freq: Once | INTRAVENOUS | Status: AC
  Administered 2018-07-27: 1000 mL via INTRAVENOUS
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
## 2018-07-27 ENCOUNTER — Emergency Department: Payer: No Typology Code available for payment source

## 2018-07-27 ENCOUNTER — Encounter: Payer: Self-pay | Admitting: Radiology

## 2018-07-27 LAB — CBC WITH DIFFERENTIAL/PLATELET
Abs Immature Granulocytes: 0.01 10*3/uL (ref 0.00–0.07)
Basophils Absolute: 0 10*3/uL (ref 0.0–0.1)
Basophils Relative: 0 %
EOS PCT: 4 %
Eosinophils Absolute: 0.2 10*3/uL (ref 0.0–0.5)
HEMATOCRIT: 40.6 % (ref 39.0–52.0)
Hemoglobin: 13.7 g/dL (ref 13.0–17.0)
Immature Granulocytes: 0 %
LYMPHS ABS: 1.4 10*3/uL (ref 0.7–4.0)
Lymphocytes Relative: 24 %
MCH: 32.2 pg (ref 26.0–34.0)
MCHC: 33.7 g/dL (ref 30.0–36.0)
MCV: 95.5 fL (ref 80.0–100.0)
MONO ABS: 0.7 10*3/uL (ref 0.1–1.0)
Monocytes Relative: 12 %
Neutro Abs: 3.4 10*3/uL (ref 1.7–7.7)
Neutrophils Relative %: 60 %
Platelets: 101 10*3/uL — ABNORMAL LOW (ref 150–400)
RBC: 4.25 MIL/uL (ref 4.22–5.81)
RDW: 14.4 % (ref 11.5–15.5)
WBC: 5.6 10*3/uL (ref 4.0–10.5)
nRBC: 0 % (ref 0.0–0.2)

## 2018-07-27 LAB — COMPREHENSIVE METABOLIC PANEL
ALT: 52 U/L — ABNORMAL HIGH (ref 0–44)
AST: 111 U/L — ABNORMAL HIGH (ref 15–41)
Albumin: 4.3 g/dL (ref 3.5–5.0)
Alkaline Phosphatase: 59 U/L (ref 38–126)
Anion gap: 14 (ref 5–15)
BUN: 9 mg/dL (ref 6–20)
CALCIUM: 9 mg/dL (ref 8.9–10.3)
CO2: 26 mmol/L (ref 22–32)
Chloride: 100 mmol/L (ref 98–111)
Creatinine, Ser: 0.72 mg/dL (ref 0.61–1.24)
GFR calc non Af Amer: >60 mL/min (ref >60)
Glucose, Bld: 129 mg/dL — ABNORMAL HIGH (ref 70–99)
Potassium: 4.1 mmol/L (ref 3.5–5.1)
Sodium: 140 mmol/L (ref 135–145)
Total Bilirubin: 0.5 mg/dL (ref 0.3–1.2)
Total Protein: 8.5 g/dL — ABNORMAL HIGH (ref 6.5–8.1)

## 2018-07-27 LAB — URINE DRUG SCREEN, QUALITATIVE (ARMC ONLY)
Amphetamines, Ur Screen: NOT DETECTED
BARBITURATES, UR SCREEN: NOT DETECTED
Benzodiazepine, Ur Scrn: NOT DETECTED
CANNABINOID 50 NG, UR ~~LOC~~: NOT DETECTED
COCAINE METABOLITE, UR ~~LOC~~: NOT DETECTED
MDMA (Ecstasy)Ur Screen: NOT DETECTED
Methadone Scn, Ur: NOT DETECTED
Opiate, Ur Screen: NOT DETECTED
Phencyclidine (PCP) Ur S: NOT DETECTED
Tricyclic, Ur Screen: NOT DETECTED

## 2018-07-27 LAB — LIPASE, BLOOD: Lipase: 36 U/L (ref 11–51)

## 2018-07-27 LAB — ETHANOL: ALCOHOL ETHYL (B): 502 mg/dL — AB (ref <10)

## 2018-07-27 MED ORDER — THIAMINE HCL 100 MG/ML IJ SOLN
Freq: Once | INTRAVENOUS | Status: AC
  Administered 2018-07-27: 02:00:00 via INTRAVENOUS
  Filled 2018-07-27: qty 1000

## 2018-07-27 MED ORDER — IBUPROFEN 600 MG PO TABS
ORAL_TABLET | ORAL | Status: AC
  Administered 2018-07-27: 600 mg via ORAL
  Filled 2018-07-27: qty 1

## 2018-07-27 MED ORDER — VITAMIN B-1 100 MG PO TABS
100.0000 mg | ORAL_TABLET | Freq: Every day | ORAL | Status: DC
  Administered 2018-07-27: 100 mg via ORAL
  Filled 2018-07-27: qty 1

## 2018-07-27 MED ORDER — ONDANSETRON HCL 4 MG/2ML IJ SOLN
4.0000 mg | Freq: Once | INTRAMUSCULAR | Status: AC
  Administered 2018-07-27: 4 mg via INTRAVENOUS
  Filled 2018-07-27: qty 2

## 2018-07-27 MED ORDER — FENTANYL CITRATE (PF) 100 MCG/2ML IJ SOLN
50.0000 ug | Freq: Once | INTRAMUSCULAR | Status: AC
  Administered 2018-07-27: 50 ug via INTRAVENOUS
  Filled 2018-07-27: qty 2

## 2018-07-27 MED ORDER — IOPAMIDOL (ISOVUE-300) INJECTION 61%
100.0000 mL | Freq: Once | INTRAVENOUS | Status: AC | PRN
  Administered 2018-07-27: 100 mL via INTRAVENOUS

## 2018-07-27 MED ORDER — IBUPROFEN 600 MG PO TABS
600.0000 mg | ORAL_TABLET | Freq: Once | ORAL | Status: AC
  Administered 2018-07-27: 600 mg via ORAL

## 2018-07-27 MED ORDER — LORAZEPAM 2 MG PO TABS
2.0000 mg | ORAL_TABLET | Freq: Once | ORAL | Status: AC
  Administered 2018-07-27: 2 mg via ORAL
  Filled 2018-07-27: qty 1

## 2018-07-27 MED ORDER — LORAZEPAM 2 MG PO TABS
0.0000 mg | ORAL_TABLET | Freq: Four times a day (QID) | ORAL | Status: DC
  Administered 2018-07-27: 2 mg via ORAL
  Filled 2018-07-27: qty 1

## 2018-07-27 NOTE — ED Notes (Signed)
Phone provided for pt to call family per request.

## 2018-07-27 NOTE — Discharge Instructions (Addendum)
Drink alcohol only in moderation.  Return to the ER for worsening symptoms, persistent vomiting, difficulty breathing or other concerns. 

## 2018-07-27 NOTE — ED Notes (Signed)
Pt picked up by Warren LacyStephanie Sanders (fiance)

## 2018-07-27 NOTE — ED Notes (Signed)
Mauri PoleFiance Stephanie called to inquire about pt and asked that pt give her a call at [phone] when he wakes up.

## 2018-07-27 NOTE — ED Notes (Signed)
Pt is sleeping at this time. Side rails up. Snoring noted, rise and fall of chest noted.

## 2018-07-27 NOTE — ED Notes (Signed)
Pt talking to fiance on the phone. Informed pt to let her know that he is being DC and to come pick him up. She stated she would get a car to come pick him up. Pt stated "I can just walk." pt informed by this RN and fiance that he can't walk home. Awaiting for fiance before DCing pt.

## 2018-07-27 NOTE — ED Notes (Signed)
Pt given meal tray and ginger ale at this time 

## 2018-07-27 NOTE — ED Notes (Signed)
MD Dolores FrameSung made aware of pt's critical ETOH result.
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
## 2018-07-30 ENCOUNTER — Emergency Department
Admission: EM | Admit: 2018-07-30 | Discharge: 2018-07-31 | Disposition: A | Discharge status: Home / Self Care | Payer: Self-pay | Attending: Student in an Organized Health Care Education/Training Program | Admitting: Student in an Organized Health Care Education/Training Program

## 2018-07-30 ENCOUNTER — Emergency Department: Payer: Self-pay

## 2018-07-30 ENCOUNTER — Other Ambulatory Visit: Payer: Self-pay

## 2018-07-30 DIAGNOSIS — F10929 Alcohol use, unspecified with intoxication, unspecified: Secondary | ICD-10-CM

## 2018-07-30 DIAGNOSIS — F17200 Nicotine dependence, unspecified, uncomplicated: Secondary | ICD-10-CM | POA: Insufficient documentation

## 2018-07-30 DIAGNOSIS — F10129 Alcohol abuse with intoxication, unspecified: Secondary | ICD-10-CM | POA: Insufficient documentation

## 2018-07-30 DIAGNOSIS — R4182 Altered mental status, unspecified: Secondary | ICD-10-CM | POA: Insufficient documentation

## 2018-07-30 LAB — CBC WITH DIFFERENTIAL/PLATELET
ABS IMMATURE GRANULOCYTES: 0 10*3/uL (ref 0.00–0.07)
BASOS PCT: 1 %
Basophils Absolute: 0 10*3/uL (ref 0.0–0.1)
Eosinophils Absolute: 0.1 10*3/uL (ref 0.0–0.5)
Eosinophils Relative: 2 %
HEMATOCRIT: 38.7 % — AB (ref 39.0–52.0)
Hemoglobin: 12.8 g/dL — ABNORMAL LOW (ref 13.0–17.0)
Immature Granulocytes: 0 %
LYMPHS ABS: 1.6 10*3/uL (ref 0.7–4.0)
Lymphocytes Relative: 39 %
MCH: 32.5 pg (ref 26.0–34.0)
MCHC: 33.1 g/dL (ref 30.0–36.0)
MCV: 98.2 fL (ref 80.0–100.0)
MONO ABS: 0.7 10*3/uL (ref 0.1–1.0)
MONOS PCT: 17 %
NEUTROS ABS: 1.7 10*3/uL (ref 1.7–7.7)
Neutrophils Relative %: 41 %
PLATELETS: 127 10*3/uL — AB (ref 150–400)
RBC: 3.94 MIL/uL — ABNORMAL LOW (ref 4.22–5.81)
RDW: 14.5 % (ref 11.5–15.5)
WBC: 4.1 10*3/uL (ref 4.0–10.5)
nRBC: 0 % (ref 0.0–0.2)

## 2018-07-30 LAB — COMPREHENSIVE METABOLIC PANEL
ALT: 38 U/L (ref 0–44)
AST: 81 U/L — ABNORMAL HIGH (ref 15–41)
Albumin: 4.1 g/dL (ref 3.5–5.0)
Alkaline Phosphatase: 59 U/L (ref 38–126)
Anion gap: 9 (ref 5–15)
BILIRUBIN TOTAL: 0.5 mg/dL (ref 0.3–1.2)
BUN: 10 mg/dL (ref 6–20)
CALCIUM: 8.8 mg/dL — AB (ref 8.9–10.3)
CHLORIDE: 103 mmol/L (ref 98–111)
CO2: 27 mmol/L (ref 22–32)
CREATININE: 0.68 mg/dL (ref 0.61–1.24)
Glucose, Bld: 97 mg/dL (ref 70–99)
Potassium: 4 mmol/L (ref 3.5–5.1)
Sodium: 139 mmol/L (ref 135–145)
TOTAL PROTEIN: 8.3 g/dL — AB (ref 6.5–8.1)

## 2018-07-30 LAB — TROPONIN I

## 2018-07-30 LAB — ETHANOL: ALCOHOL ETHYL (B): 537 mg/dL — AB (ref <10)

## 2018-07-30 MED ORDER — SODIUM CHLORIDE 0.9 % IV BOLUS
500.0000 mL | Freq: Once | INTRAVENOUS | Status: AC
  Administered 2018-07-30: 500 mL via INTRAVENOUS

## 2018-07-30 NOTE — ED Notes (Signed)
Pt noted holding penis in hand and urinating on stretcher and floor again. Reinforced use of urinal with pt. Pt refused to allow staff to remove wet pants and underwear. Notified charge who will find a Recruitment consultantsafety sitter for pt d/t high fall risk.

## 2018-07-30 NOTE — ED Notes (Signed)
Date and time results received: 07/30/18  9:15 pm  Test: etoh Critical Value: 537  Name of Provider Notified: robinson   Orders Received? Or Actions Taken? No new orders at this time

## 2018-07-30 NOTE — ED Notes (Signed)
PT becoming more aggressive. Yelling at RN about his baby and using profanity. Calmed after [employer] entered room

## 2018-07-30 NOTE — ED Notes (Signed)
Pt in room yelling and swearing. No one else is in room with patient. Entered room, pt educated on need for quiet in ED. Pt remains quiet for a few minutes at a time and then returns to yelling and swearing.

## 2018-07-30 NOTE — ED Notes (Addendum)
Pt attempted to climb out of bed. Urinated all over himself and the floor. PT refused to get out of dirty pants.   No CIWAA implemented at this time-RN believes that due to the level of intoxication, it would not be a clear indicator of withdrawal at this time-pt is very agitated, yelling, and hallucinating but d/t intoxication and not withdrawal
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
## 2018-07-30 NOTE — ED Provider Notes (Signed)
[facility] Emergency Department Provider Note    First MD Initiated Contact with Patient 07/30/18 2039     (approximate)  I have reviewed the triage vital signs and the nursing notes.   HISTORY  Chief Complaint Alcohol Intoxication and Fall  Level V Caveat:  AMS  HPI Terry Clark is a 47 y.o. male presents the ER for evaluation of intoxication.  Patient reportedly found altered laying on the ground at [employer].  Does not member being jumped or any assault.  Patient smells heavily of alcohol.  Denies any other substance abuse.  Patient was just seen here in the ER I will days ago with alcohol intoxication.  Unable to provide any additional history 2/2 profound intoxication.    Past Medical History:  Diagnosis Date  . Alcohol abuse   . Alcohol withdrawal seizure (HCC)    Family History  Problem Relation Age of Onset  . Kidney failure Mother   . Cancer Other    Past Surgical History:  Procedure Laterality Date  . TONSILLECTOMY     Patient Active Problem List   Diagnosis Date Noted  . Alcohol withdrawal seizure (HCC) 06/13/2014  . Alcoholic hepatitis 06/13/2014      Prior to Admission medications   Not on File    Allergies Patient has no known allergies.    Social History Social History   Tobacco Use  . Smoking status: Current Every Day Smoker    Packs/day: 1.00  . Smokeless tobacco: Never Used  Substance Use Topics  . Alcohol use: Yes    Alcohol/week: 252.0 standard drinks    Types: 252 Cans of beer per week    Comment:  12 pack per day  . Drug use: Yes    Types: Marijuana    Review of Systems Patient denies headaches, rhinorrhea, blurry vision, numbness, shortness of breath, chest pain, edema, cough, abdominal pain, nausea, vomiting, diarrhea, dysuria, fevers, rashes or hallucinations unless otherwise stated above in HPI. ____________________________________________   PHYSICAL EXAM:  VITAL SIGNS: Vitals:   07/30/18 2137 07/30/18 2200  BP: (!) 162/114 (!) 167/106  Pulse:  76  Resp: (!) 25 (!) 21  Temp:    SpO2:  100%    Constitutional: Alert, heavily intoxicated, protecting  Eyes: Conjunctivae are normal.  Head: Atraumatic. Nose: No congestion/rhinnorhea. Mouth/Throat: Mucous membranes are moist.   Neck: No stridor. Painless ROM.  Cardiovascular: Normal rate, regular rhythm. Grossly normal heart sounds.  Good peripheral circulation. Respiratory: Normal respiratory effort.  No retractions. Lungs CTAB. Gastrointestinal: Soft and nontender. No distention. No abdominal bruits. No CVA tenderness. Genitourinary:  Musculoskeletal: No lower extremity tenderness nor edema.  No joint effusions. Neurologic:  Normal speech and language. No gross focal neurologic deficits are appreciated. No facial droop Skin:  Skin is warm, dry and intact. No rash noted. Psychiatric: intoxicated, agitated ____________________________________________   LABS (all labs ordered are listed, but only abnormal results are displayed)  Results for orders placed or performed during the hospital encounter of 07/30/18 (from the past 24 hour(s))  Ethanol     Status: Abnormal   Collection Time: 07/30/18  8:43 PM  Result Value Ref Range   Alcohol, Ethyl (B) 537 (HH) <10 mg/dL  CBC with Differential/Platelet     Status: Abnormal   Collection Time: 07/30/18  8:43 PM  Result Value Ref Range   WBC 4.1 4.0 - 10.5 K/uL   RBC 3.94 (L) 4.22 - 5.81 MIL/uL   Hemoglobin 12.8 (L) 13.0 -  17.0 g/dL   HCT 40.138.7 (L) 02.739.0 - 25.352.0 %   MCV 98.2 80.0 - 100.0 fL   MCH 32.5 26.0 - 34.0 pg   MCHC 33.1 30.0 - 36.0 g/dL   RDW 66.414.5 40.311.5 - 47.415.5 %   Platelets 127 (L) 150 - 400 K/uL   nRBC 0.0 0.0 - 0.2 %   Neutrophils Relative % 41 %   Neutro Abs 1.7 1.7 - 7.7 K/uL   Lymphocytes Relative 39 %   Lymphs Abs 1.6 0.7 - 4.0 K/uL   Monocytes Relative 17 %   Monocytes Absolute 0.7 0.1 - 1.0 K/uL   Eosinophils Relative 2 %   Eosinophils Absolute  0.1 0.0 - 0.5 K/uL   Basophils Relative 1 %   Basophils Absolute 0.0 0.0 - 0.1 K/uL   Immature Granulocytes 0 %   Abs Immature Granulocytes 0.00 0.00 - 0.07 K/uL  Comprehensive metabolic panel     Status: Abnormal   Collection Time: 07/30/18  8:43 PM  Result Value Ref Range   Sodium 139 135 - 145 mmol/L   Potassium 4.0 3.5 - 5.1 mmol/L   Chloride 103 98 - 111 mmol/L   CO2 27 22 - 32 mmol/L   Glucose, Bld 97 70 - 99 mg/dL   BUN 10 6 - 20 mg/dL   Creatinine, Ser 2.590.68 0.61 - 1.24 mg/dL   Calcium 8.8 (L) 8.9 - 10.3 mg/dL   Total Protein 8.3 (H) 6.5 - 8.1 g/dL   Albumin 4.1 3.5 - 5.0 g/dL   AST 81 (H) 15 - 41 U/L   ALT 38 0 - 44 U/L   Alkaline Phosphatase 59 38 - 126 U/L   Total Bilirubin 0.5 0.3 - 1.2 mg/dL   GFR calc non Af Amer >60 >60 mL/min   GFR calc Af Amer >60 >60 mL/min   Anion gap 9 5 - 15  Troponin I - ONCE - STAT     Status: None   Collection Time: 07/30/18  8:43 PM  Result Value Ref Range   Troponin I <0.03 <0.03 ng/mL   ____________________________________________  EKG My review and personal interpretation at Time: 20;41   Indication: intoxication  Rate: 70  Rhythm: sinus Axis: normal Other: normal intervals, concave upwars st elevations in inferior leads without reciprocal changes, similar morphology to 07/27/18 and 10/12/14.  likely BER or LVH____________________________________________  RADIOLOGY  I personally reviewed all radiographic images ordered to evaluate for the above acute complaints and reviewed radiology reports and findings.  These findings were personally discussed with the patient.  Please see medical record for radiology report.  ____________________________________________   PROCEDURES  Procedure(s) performed:  Procedures    Critical Care performed: no ____________________________________________   INITIAL IMPRESSION / ASSESSMENT AND PLAN / ED COURSE  Pertinent labs & imaging results that were available during my care of the patient  were reviewed by me and considered in my medical decision making (see chart for details).   DDX: Dehydration, sepsis, pna, uti, hypoglycemia, sdh, sah, drug effect  Terry Clark is a 47 y.o. who presents to the ED with symptoms as described above.  Patient clinically quite intoxicated.  Due to abrasion contusion left forehead and unable to obtain reliable history or exam will order CT imaging of the head and neck to exclude traumatic injury.  The patient will be placed on continuous pulse oximetry and telemetry for monitoring.  Laboratory evaluation will be sent to evaluate for the above complaints.     Clinical Course  as of Jul 30 2233  Tue Jul 30, 2018  2156 CT imaging is reassuring.  Patient with profound alcohol intoxication.  Still protecting his airway.  Patient will be observed in the ER until achieving clinical sobriety.  Will be signed out to oncoming physician.   [PR]  2233 Remains hemodynamically stable.  Intermittently yelling but will quickly doze off.  Will remain on monitor while he metabolizes his ingestion.   [PR]    Clinical Course User Index [PR] Willy Eddy, MD     As part of my medical decision making, I reviewed the following data within the electronic MEDICAL RECORD NUMBER Nursing notes reviewed and incorporated, Labs reviewed, notes from prior ED visits. ____________________________________________   FINAL CLINICAL IMPRESSION(S) / ED DIAGNOSES  Final diagnoses:  Alcoholic intoxication with complication (HCC)      NEW MEDICATIONS STARTED DURING THIS VISIT:  New Prescriptions   No medications on file     Note:  This document was prepared using Dragon voice recognition software and may include unintentional dictation errors.    Willy Eddy, MD 07/30/18 2234

## 2018-07-31 MED ORDER — ACETAMINOPHEN 500 MG PO TABS
1000.0000 mg | ORAL_TABLET | Freq: Once | ORAL | Status: AC
  Administered 2018-07-31: 1000 mg via ORAL
  Filled 2018-07-31: qty 2

## 2018-07-31 NOTE — ED Notes (Signed)
Pt resting on stretcher with eyes closed and even respirations. No distress noted. Sitter present for pt safety. No distress noted at this time.

## 2018-07-31 NOTE — Discharge Instructions (Signed)
Please cut down on your drinking.  Your alcohol level was approaching the legal limit.  If you drink much more at one time you could make your breathing stop and die.  If you need help cutting down on your alcohol or stopping to drink please call Alcoholics Anonymous and begin attending meetings.  We can also get you into [facility] for detox i if you want that.

## 2018-07-31 NOTE — ED Notes (Signed)
Pt resting on stretcher with lights out to enhance rest. Eyes closed and even unlabored respirations noted at this time. Pt easily awoken with verbal stimuli. Provided for safety and comfort and will continue to assess.

## 2018-07-31 NOTE — ED Notes (Signed)
Pt calling ride for discharge at this time.

## 2018-07-31 NOTE — ED Notes (Signed)
Pt loudly calling out telling staff he wants to have food and a telephone. Appears argumentative. Pt informed of policies and rules for phone use and meal times. Verbalized understanding. Pt alert and talkative at this time. MD aware.

## 2018-07-31 NOTE — ED Notes (Signed)
This RN to bedside due to patient calling out. Pt requesting water at this time. Pt sates, "I'm in pain, I need something for pain". Pt resting comfortably in bed at this time. Will continue to monitor for further patient needs.

## 2018-07-31 NOTE — ED Notes (Signed)
Pt alert and oriented x 4 at this time, ambulatory to the lobby with steady gait. Pt denies comments/concerns regarding D/C instructions at this time. Pt ambulatory to the lobby.

## 2018-08-20 ENCOUNTER — Emergency Department
Admission: EM | Admit: 2018-08-20 | Discharge: 2018-08-21 | Disposition: A | Discharge status: Home / Self Care | Payer: Self-pay | Attending: Emergency Medicine | Admitting: Emergency Medicine

## 2018-08-20 ENCOUNTER — Emergency Department: Payer: Self-pay

## 2018-08-20 ENCOUNTER — Encounter: Payer: Self-pay | Admitting: Emergency Medicine

## 2018-08-20 ENCOUNTER — Other Ambulatory Visit: Payer: Self-pay

## 2018-08-20 DIAGNOSIS — F10920 Alcohol use, unspecified with intoxication, uncomplicated: Secondary | ICD-10-CM | POA: Insufficient documentation

## 2018-08-20 DIAGNOSIS — K7010 Alcoholic hepatitis without ascites: Secondary | ICD-10-CM | POA: Insufficient documentation

## 2018-08-20 DIAGNOSIS — R4182 Altered mental status, unspecified: Secondary | ICD-10-CM | POA: Insufficient documentation

## 2018-08-20 DIAGNOSIS — K2920 Alcoholic gastritis without bleeding: Secondary | ICD-10-CM | POA: Insufficient documentation

## 2018-08-20 DIAGNOSIS — F17210 Nicotine dependence, cigarettes, uncomplicated: Secondary | ICD-10-CM | POA: Insufficient documentation

## 2018-08-20 LAB — URINE DRUG SCREEN, QUALITATIVE (ARMC ONLY)
Amphetamines, Ur Screen: NOT DETECTED
BARBITURATES, UR SCREEN: NOT DETECTED
BENZODIAZEPINE, UR SCRN: NOT DETECTED
CANNABINOID 50 NG, UR ~~LOC~~: NOT DETECTED
Cocaine Metabolite,Ur ~~LOC~~: NOT DETECTED
MDMA (Ecstasy)Ur Screen: NOT DETECTED
Methadone Scn, Ur: NOT DETECTED
Opiate, Ur Screen: NOT DETECTED
Phencyclidine (PCP) Ur S: NOT DETECTED
TRICYCLIC, UR SCREEN: NOT DETECTED

## 2018-08-20 LAB — COMPREHENSIVE METABOLIC PANEL
ALBUMIN: 4.4 g/dL (ref 3.5–5.0)
ALT: 152 U/L — ABNORMAL HIGH (ref 0–44)
ALT: 147 U/L — ABNORMAL HIGH (ref 0–44)
AST: 233 U/L — AB (ref 15–41)
AST: 217 U/L — ABNORMAL HIGH (ref 15–41)
Albumin: 4.4 g/dL (ref 3.5–5.0)
Alkaline Phosphatase: 77 U/L (ref 38–126)
Alkaline Phosphatase: 77 U/L (ref 38–126)
Anion gap: 10 (ref 5–15)
Anion gap: 9 (ref 5–15)
BILIRUBIN TOTAL: 1.1 mg/dL (ref 0.3–1.2)
BUN: 12 mg/dL (ref 6–20)
BUN: 11 mg/dL (ref 6–20)
CALCIUM: 8.2 mg/dL — AB (ref 8.9–10.3)
CHLORIDE: 107 mmol/L (ref 98–111)
CO2: 24 mmol/L (ref 22–32)
CO2: 28 mmol/L (ref 22–32)
CREATININE: 0.63 mg/dL (ref 0.61–1.24)
Calcium: 8.2 mg/dL — ABNORMAL LOW (ref 8.9–10.3)
Chloride: 105 mmol/L (ref 98–111)
Creatinine, Ser: 0.81 mg/dL (ref 0.61–1.24)
GFR calc Af Amer: >60 mL/min (ref >60)
GFR calc Af Amer: >60 mL/min (ref >60)
GFR calc non Af Amer: >60 mL/min (ref >60)
GFR calc non Af Amer: >60 mL/min (ref >60)
GLUCOSE: 105 mg/dL — AB (ref 70–99)
Glucose, Bld: 110 mg/dL — ABNORMAL HIGH (ref 70–99)
POTASSIUM: 5 mmol/L (ref 3.5–5.1)
POTASSIUM: 3.6 mmol/L (ref 3.5–5.1)
SODIUM: 142 mmol/L (ref 135–145)
Sodium: 141 mmol/L (ref 135–145)
TOTAL PROTEIN: 8.4 g/dL — AB (ref 6.5–8.1)
Total Bilirubin: 0.5 mg/dL (ref 0.3–1.2)
Total Protein: 8.4 g/dL — ABNORMAL HIGH (ref 6.5–8.1)

## 2018-08-20 LAB — CBC WITH DIFFERENTIAL/PLATELET
ABS IMMATURE GRANULOCYTES: 0.01 10*3/uL (ref 0.00–0.07)
BASOS PCT: 0 %
Basophils Absolute: 0 10*3/uL (ref 0.0–0.1)
EOS ABS: 0 10*3/uL (ref 0.0–0.5)
EOS PCT: 1 %
HCT: 40.6 % (ref 39.0–52.0)
Hemoglobin: 13.6 g/dL (ref 13.0–17.0)
Immature Granulocytes: 0 %
Lymphocytes Relative: 44 %
Lymphs Abs: 1.1 10*3/uL (ref 0.7–4.0)
MCH: 32.8 pg (ref 26.0–34.0)
MCHC: 33.5 g/dL (ref 30.0–36.0)
MCV: 97.8 fL (ref 80.0–100.0)
MONOS PCT: 15 %
Monocytes Absolute: 0.4 10*3/uL (ref 0.1–1.0)
Neutro Abs: 1 10*3/uL — ABNORMAL LOW (ref 1.7–7.7)
Neutrophils Relative %: 40 %
PLATELETS: 126 10*3/uL — AB (ref 150–400)
RBC: 4.15 MIL/uL — ABNORMAL LOW (ref 4.22–5.81)
RDW: 15.2 % (ref 11.5–15.5)
WBC: 2.6 10*3/uL — ABNORMAL LOW (ref 4.0–10.5)
nRBC: 0 % (ref 0.0–0.2)

## 2018-08-20 LAB — URINALYSIS, COMPLETE (UACMP) WITH MICROSCOPIC
BILIRUBIN URINE: NEGATIVE
Glucose, UA: NEGATIVE mg/dL
Ketones, ur: NEGATIVE mg/dL
LEUKOCYTES UA: NEGATIVE
Nitrite: NEGATIVE
PROTEIN: 30 mg/dL — AB
Specific Gravity, Urine: 1.012 (ref 1.005–1.030)
pH: 5 (ref 5.0–8.0)

## 2018-08-20 LAB — ETHANOL: ALCOHOL ETHYL (B): 558 mg/dL — AB (ref <10)

## 2018-08-20 LAB — ACETAMINOPHEN LEVEL

## 2018-08-20 LAB — LIPASE, BLOOD
Lipase: 37 U/L (ref 11–51)
Lipase: 36 U/L (ref 11–51)

## 2018-08-20 LAB — SALICYLATE LEVEL: Salicylate Lvl: <7 mg/dL (ref 2.8–30.0)

## 2018-08-20 MED ORDER — LORAZEPAM 2 MG/ML IJ SOLN
0.0000 mg | Freq: Four times a day (QID) | INTRAMUSCULAR | Status: DC
  Administered 2018-08-20: 1 mg via INTRAVENOUS
  Filled 2018-08-20: qty 1

## 2018-08-20 MED ORDER — LORAZEPAM 2 MG/ML IJ SOLN: 0.0000 mg | Freq: Two times a day (BID) | INTRAMUSCULAR | Status: DC

## 2018-08-20 MED ORDER — SODIUM CHLORIDE 0.9 % IV BOLUS
1000.0000 mL | Freq: Once | INTRAVENOUS | Status: AC
  Administered 2018-08-20: 1000 mL via INTRAVENOUS

## 2018-08-20 MED ORDER — THIAMINE HCL 100 MG/ML IJ SOLN: 100.0000 mg | Freq: Every day | INTRAMUSCULAR | Status: DC

## 2018-08-20 MED ORDER — LORAZEPAM 2 MG PO TABS: 0.0000 mg | ORAL_TABLET | Freq: Two times a day (BID) | ORAL | Status: DC

## 2018-08-20 MED ORDER — HALOPERIDOL LACTATE 5 MG/ML IJ SOLN
INTRAMUSCULAR | Status: AC
  Administered 2018-08-20: 5 mg via INTRAVENOUS
  Filled 2018-08-20: qty 1

## 2018-08-20 MED ORDER — LORAZEPAM 2 MG PO TABS: 0.0000 mg | ORAL_TABLET | Freq: Four times a day (QID) | ORAL | Status: DC

## 2018-08-20 MED ORDER — HALOPERIDOL LACTATE 5 MG/ML IJ SOLN
5.0000 mg | Freq: Once | INTRAMUSCULAR | Status: AC
  Administered 2018-08-20: 5 mg via INTRAVENOUS

## 2018-08-20 MED ORDER — VITAMIN B-1 100 MG PO TABS
100.0000 mg | ORAL_TABLET | Freq: Every day | ORAL | Status: DC
  Administered 2018-08-21: 100 mg via ORAL
  Filled 2018-08-20: qty 1

## 2018-08-20 NOTE — ED Triage Notes (Signed)
Pt in via [facility], pt found passed out, lying on left side in his own emesis and feces, found with two empty liquor bottles.  Pt alert upon arrival, disoriented, and non cooperative.  EDP called to bedside.

## 2018-08-20 NOTE — ED Provider Notes (Signed)
[facility] Emergency Department Provider Note  ____________________________________________  Time seen: Approximately 9:56 PM  I have reviewed the triage vital signs and the nursing notes.   HISTORY  Chief Complaint Alcohol Intoxication  Level 5 Caveat: Portions of the History and Physical including HPI and review of systems are unable to be completely obtained due to patient being a poor historian due to intoxication   HPI Terry Clark is a 47 y.o. male with a history of alcohol abuse and alcohol withdrawal seizures who is brought to the ED today  by EMS after being found passed out on the ground lying in a pool of his own vomit.  Patient reports drinking today.  Denies drug use.  Denies any pain or shortness of breath.  He drinks at least a 12 pack a day.  Today he had to empty liquor bottles with him.  Past Medical History:  Diagnosis Date  . Alcohol abuse   . Alcohol withdrawal seizure [facility])      Patient Active Problem List   Diagnosis Date Noted  . Alcohol withdrawal seizure (HCC) 06/13/2014  . Alcoholic hepatitis 06/13/2014     Past Surgical History:  Procedure Laterality Date  . TONSILLECTOMY       Prior to Admission medications   Not on File     Allergies Patient has no known allergies.   Family History  Problem Relation Age of Onset  . Kidney failure Mother   . Cancer Other     Social History Social History   Tobacco Use  . Smoking status: Current Every Day Smoker    Packs/day: 1.00  . Smokeless tobacco: Never Used  Substance Use Topics  . Alcohol use: Yes    Alcohol/week: 252.0 standard drinks    Types: 252 Cans of beer per week    Comment:  12 pack per day  . Drug use: Yes    Types: Marijuana, Cocaine    Review of Systems  Constitutional:   No fever or chills.   Cardiovascular:   No chest pain or syncope. Respiratory:   No dyspnea or cough. Gastrointestinal:   Negative for abdominal pain, positive  vomiting Musculoskeletal:   Negative for focal pain or swelling All other systems reviewed and are negative except as documented above in ROS and HPI.  ____________________________________________   PHYSICAL EXAM:  VITAL SIGNS: ED Triage Vitals  Enc Vitals Group     BP 08/20/18 1900 (!) 161/109     Pulse Rate 08/20/18 1902 86     Resp 08/20/18 1900 11     Temp --      Temp src --      SpO2 08/20/18 1902 97 %     Weight 08/20/18 1906 154 lb 5.2 oz (70 kg)     Height 08/20/18 1906 5\' 6"  (1.676 m)     Head Circumference --      Peak Flow --      Pain Score 08/20/18 1906 0     Pain Loc --      Pain Edu? --      Excl. in GC? --     Vital signs reviewed, nursing assessments reviewed.   Constitutional:   Awake and alert.  Not oriented.  Nontoxic. Eyes:   Conjunctivae are normal. EOMI. PERRL. ENT      Head:   Normocephalic and atraumatic.      Nose:   No congestion/rhinnorhea.       Mouth/Throat:   Dry mucous  membranes, no pharyngeal erythema. No peritonsillar mass.       Neck:   No meningismus. Full ROM. Hematological/Lymphatic/Immunilogical:   No cervical lymphadenopathy. Cardiovascular:   RRR. Symmetric bilateral radial and DP pulses.  No murmurs. Cap refill less than 2 seconds. Respiratory:   Normal respiratory effort without tachypnea/retractions. Breath sounds are clear and equal bilaterally. No wheezes/rales/rhonchi. Gastrointestinal:   Soft with epigastric and left upper quadrant tenderness. Non distended. There is no CVA tenderness.  No rebound, rigidity, or guarding. Genitourinary:   Normal Musculoskeletal:   Normal range of motion in all extremities. No joint effusions.  No lower extremity tenderness.  No edema. Neurologic:   Slurred speech, limited language.  Motor grossly intact. Skin:    Skin is warm, dry and intact. No rash noted.  No petechiae, purpura, or bullae.  ____________________________________________    LABS (pertinent positives/negatives) (all  labs ordered are listed, but only abnormal results are displayed) Labs Reviewed  COMPREHENSIVE METABOLIC PANEL - Abnormal; Notable for the following components:      Result Value   Glucose, Bld 105 (*)    Calcium 8.2 (*)    Total Protein 8.4 (*)    AST 233 (*)    ALT 152 (*)    All other components within normal limits  ACETAMINOPHEN LEVEL - Abnormal; Notable for the following components:   Acetaminophen (Tylenol), Serum <10 (*)    All other components within normal limits  ETHANOL - Abnormal; Notable for the following components:   Alcohol, Ethyl (B) 558 (*)    All other components within normal limits  CBC WITH DIFFERENTIAL/PLATELET - Abnormal; Notable for the following components:   WBC 2.6 (*)    RBC 4.15 (*)    Platelets 126 (*)    Neutro Abs 1.0 (*)    All other components within normal limits  COMPREHENSIVE METABOLIC PANEL - Abnormal; Notable for the following components:   Glucose, Bld 110 (*)    Calcium 8.2 (*)    Total Protein 8.4 (*)    AST 217 (*)    ALT 147 (*)    All other components within normal limits  LIPASE, BLOOD  LIPASE, BLOOD  SALICYLATE LEVEL  CBC WITH DIFFERENTIAL/PLATELET  URINALYSIS, COMPLETE (UACMP) WITH MICROSCOPIC  URINE DRUG SCREEN, QUALITATIVE ([facility] ONLY)   ____________________________________________   EKG  Interpreted by me Sinus rhythm rate of 80.  Normal axis and intervals.  Normal QRS ST segments and T waves.  No acute ischemic changes.  ____________________________________________    RADIOLOGY  Dg Chest Portable 1 View  Result Date: 08/20/2018 CLINICAL DATA:  Found down, unresponsive.  History of alcohol abuse. EXAM: PORTABLE CHEST 1 VIEW COMPARISON:  CT chest, abdomen and pelvis July 27, 2018 FINDINGS: Cardiomediastinal silhouette is normal. No pleural effusions or focal consolidations. Trachea projects midline and there is no pneumothorax. Soft tissue planes and included osseous structures are non-suspicious. Multiple EKG  lines overlie the patient and may obscure subtle underlying pathology. Linear radiopaque foreign body projecting RIGHT axilla. IMPRESSION: 1. No acute cardiopulmonary process. 2. Linear radiopaque foreign body projecting RIGHT axilla may be external to the patient, recommend direct inspection. Electronically Signed   By: Awilda Metroourtnay  Bloomer M.D.   On: 08/20/2018 19:23    ____________________________________________   PROCEDURES .Critical Care Performed by: Sharman CheekStafford, Edwen Mclester, MD Authorized by: Sharman CheekStafford, Loron Weimer, MD   Critical care provider statement:    Critical care time (minutes):  35   Critical care time was exclusive of:  Separately billable procedures and  treating other patients   Critical care was necessary to treat or prevent imminent or life-threatening deterioration of the following conditions:  Toxidrome   Critical care was time spent personally by me on the following activities:  Development of treatment plan with patient or surrogate, discussions with consultants, evaluation of patient's response to treatment, examination of patient, obtaining history from patient or surrogate, ordering and performing treatments and interventions, ordering and review of laboratory studies, ordering and review of radiographic studies, pulse oximetry, re-evaluation of patient's condition and review of old charts    ____________________________________________    CLINICAL IMPRESSION / ASSESSMENT AND PLAN / ED COURSE  Pertinent labs & imaging results that were available during my care of the patient were reviewed by me and considered in my medical decision making (see chart for details).      Clinical Course as of Aug 21 2155  Tue Aug 20, 2018  4098 Patient clearly intoxicated and agitated.  Somewhat combative and resistant to evaluation.  Patient does not have capacity at this time.  He was given 5 mg of IV Haldol to help calm him and facilitate a safer environment for the patient to proceed  with work-up.  I am worried that he may have aspirated and have pneumonia or pneumonitis and at risk for respiratory failure.  Exam also is worrisome for possible pancreatitis related to alcohol use.   [PS]  2154 Ethanol severely elevated.  Chest x-ray image viewed by me, there is a radiopaque linear density projecting over the right axilla as mentioned by radiology report.  There is a matching object on the left side, most likely due to clothing.  Alcohol, Ethyl (B)(!!): 558 [PS]    Clinical Course User Index [PS] Sharman Cheek, MD    ----------------------------------------- 10:02 PM on 08/20/2018 -----------------------------------------  Patient will need to continue being observed in the emergency department until he is clinically sober.  Will need to watch for signs of withdrawal.  Labs show alcoholic gastritis and alcoholic hepatitis, but no imminent life threats.  IV fluids for hydration.   ____________________________________________   FINAL CLINICAL IMPRESSION(S) / ED DIAGNOSES    Final diagnoses:  Alcoholic intoxication without complication (HCC)  Acute alcoholic gastritis without hemorrhage  Alcoholic hepatitis without ascites     ED Discharge Orders    None      Portions of this note were generated with dragon dictation software. Dictation errors may occur despite best attempts at proofreading.    Sharman Cheek, MD 08/20/18 2203

## 2018-08-20 NOTE — ED Notes (Signed)
pts clothing removed due to soiled material. Pt cleaned and linen changed. Pt resting in bed with head elevated.

## 2018-08-21 NOTE — ED Provider Notes (Signed)
Patient is awake and alert, no distress.  He will be discharged as planned.   Emily FilbertWilliams, Jonathan E, MD 08/21/18 [id number]

## 2018-08-21 NOTE — ED Notes (Signed)
NAD noted at time of D/C. Pt is alert and oriented x 4. Pt denies any questions/comments/concerns regarding D/C instructions. Pt ambulatory with steady gait at this time.

## 2018-08-21 NOTE — ED Notes (Signed)
This RN to bedside, pt resting in bed with eyes closed, respirations even and unlabored. VSS and WNL.

## 2018-08-21 NOTE — ED Notes (Signed)
Pt removed all leads from himself, pt also removed condom cath from himself prior to this RN coming to room.

## 2018-09-17 ENCOUNTER — Other Ambulatory Visit: Payer: Self-pay

## 2018-09-17 ENCOUNTER — Inpatient Hospital Stay
Admission: EM | Admit: 2018-09-17 | Discharge: 2018-09-18 | DRG: 378 | Disposition: A | Discharge status: Home / Self Care | Payer: Self-pay | Attending: Internal Medicine | Admitting: Internal Medicine

## 2018-09-17 ENCOUNTER — Encounter: Payer: Self-pay | Admitting: Emergency Medicine

## 2018-09-17 ENCOUNTER — Emergency Department: Payer: Self-pay

## 2018-09-17 DIAGNOSIS — K922 Gastrointestinal hemorrhage, unspecified: Secondary | ICD-10-CM | POA: Diagnosis present

## 2018-09-17 DIAGNOSIS — Z841 Family history of disorders of kidney and ureter: Secondary | ICD-10-CM

## 2018-09-17 DIAGNOSIS — F10929 Alcohol use, unspecified with intoxication, unspecified: Secondary | ICD-10-CM | POA: Diagnosis present

## 2018-09-17 DIAGNOSIS — E871 Hypo-osmolality and hyponatremia: Secondary | ICD-10-CM | POA: Diagnosis present

## 2018-09-17 DIAGNOSIS — E860 Dehydration: Secondary | ICD-10-CM | POA: Diagnosis present

## 2018-09-17 DIAGNOSIS — F10129 Alcohol abuse with intoxication, unspecified: Secondary | ICD-10-CM | POA: Diagnosis present

## 2018-09-17 DIAGNOSIS — R454 Irritability and anger: Secondary | ICD-10-CM | POA: Insufficient documentation

## 2018-09-17 DIAGNOSIS — H1133 Conjunctival hemorrhage, bilateral: Secondary | ICD-10-CM

## 2018-09-17 DIAGNOSIS — F17200 Nicotine dependence, unspecified, uncomplicated: Secondary | ICD-10-CM | POA: Diagnosis present

## 2018-09-17 DIAGNOSIS — E876 Hypokalemia: Secondary | ICD-10-CM | POA: Diagnosis present

## 2018-09-17 DIAGNOSIS — Y908 Blood alcohol level of 240 mg/100 ml or more: Secondary | ICD-10-CM | POA: Diagnosis present

## 2018-09-17 DIAGNOSIS — K7010 Alcoholic hepatitis without ascites: Secondary | ICD-10-CM | POA: Diagnosis present

## 2018-09-17 DIAGNOSIS — K920 Hematemesis: Principal | ICD-10-CM | POA: Diagnosis present

## 2018-09-17 LAB — COMPREHENSIVE METABOLIC PANEL
ALT: 119 U/L — ABNORMAL HIGH (ref 0–44)
AST: 145 U/L — ABNORMAL HIGH (ref 15–41)
Albumin: 4.2 g/dL (ref 3.5–5.0)
Alkaline Phosphatase: 91 U/L (ref 38–126)
Anion gap: 11 (ref 5–15)
BUN: 10 mg/dL (ref 6–20)
CO2: 20 mmol/L — AB (ref 22–32)
Calcium: 9.1 mg/dL (ref 8.9–10.3)
Chloride: 102 mmol/L (ref 98–111)
Creatinine, Ser: 0.8 mg/dL (ref 0.61–1.24)
GFR calc Af Amer: >60 mL/min (ref >60)
GFR calc non Af Amer: >60 mL/min (ref >60)
Glucose, Bld: 85 mg/dL (ref 70–99)
Potassium: 3.6 mmol/L (ref 3.5–5.1)
Sodium: 133 mmol/L — ABNORMAL LOW (ref 135–145)
Total Bilirubin: 1 mg/dL (ref 0.3–1.2)
Total Protein: 8.7 g/dL — ABNORMAL HIGH (ref 6.5–8.1)

## 2018-09-17 LAB — CBC
HCT: 45.9 % (ref 39.0–52.0)
HCT: 42.9 % (ref 39.0–52.0)
Hemoglobin: 15.3 g/dL (ref 13.0–17.0)
Hemoglobin: 14.4 g/dL (ref 13.0–17.0)
MCH: 32.8 pg (ref 26.0–34.0)
MCH: 32.6 pg (ref 26.0–34.0)
MCHC: 33.3 g/dL (ref 30.0–36.0)
MCHC: 33.6 g/dL (ref 30.0–36.0)
MCV: 98.3 fL (ref 80.0–100.0)
MCV: 97.1 fL (ref 80.0–100.0)
NRBC: 0 % (ref 0.0–0.2)
Platelets: 198 10*3/uL (ref 150–400)
Platelets: 179 10*3/uL (ref 150–400)
RBC: 4.67 MIL/uL (ref 4.22–5.81)
RBC: 4.42 MIL/uL (ref 4.22–5.81)
RDW: 13.8 % (ref 11.5–15.5)
RDW: 13.7 % (ref 11.5–15.5)
WBC: 5.3 10*3/uL (ref 4.0–10.5)
WBC: 3.5 10*3/uL — ABNORMAL LOW (ref 4.0–10.5)
nRBC: 0 % (ref 0.0–0.2)

## 2018-09-17 LAB — HEMOGLOBIN AND HEMATOCRIT, BLOOD
HCT: 39.7 % (ref 39.0–52.0)
Hemoglobin: 13.1 g/dL (ref 13.0–17.0)

## 2018-09-17 LAB — ETHANOL: Alcohol, Ethyl (B): 388 mg/dL (ref <10)

## 2018-09-17 LAB — TYPE AND SCREEN
ABO/RH(D): O POS
Antibody Screen: NEGATIVE

## 2018-09-17 MED ORDER — LORAZEPAM 2 MG/ML IJ SOLN
1.0000 mg | Freq: Four times a day (QID) | INTRAMUSCULAR | Status: DC | PRN
  Administered 2018-09-18: 1 mg via INTRAVENOUS
  Filled 2018-09-17: qty 1

## 2018-09-17 MED ORDER — THIAMINE HCL 100 MG/ML IJ SOLN: 100.0000 mg | Freq: Every day | INTRAMUSCULAR | Status: DC

## 2018-09-17 MED ORDER — LORAZEPAM 1 MG PO TABS: 1.0000 mg | ORAL_TABLET | Freq: Four times a day (QID) | ORAL | Status: DC | PRN

## 2018-09-17 MED ORDER — ONDANSETRON HCL 4 MG/2ML IJ SOLN: 4.0000 mg | Freq: Four times a day (QID) | INTRAMUSCULAR | Status: DC | PRN

## 2018-09-17 MED ORDER — LORAZEPAM 2 MG/ML IJ SOLN
0.0000 mg | Freq: Four times a day (QID) | INTRAMUSCULAR | Status: DC
  Administered 2018-09-18: 2 mg via INTRAVENOUS
  Filled 2018-09-17: qty 1

## 2018-09-17 MED ORDER — THIAMINE HCL 100 MG/ML IJ SOLN
Freq: Once | INTRAVENOUS | Status: AC
  Administered 2018-09-17: 19:00:00 via INTRAVENOUS
  Filled 2018-09-17: qty 1000

## 2018-09-17 MED ORDER — FOLIC ACID 1 MG PO TABS
1.0000 mg | ORAL_TABLET | Freq: Every day | ORAL | Status: DC
  Administered 2018-09-18: 1 mg via ORAL
  Filled 2018-09-17: qty 1

## 2018-09-17 MED ORDER — ONDANSETRON HCL 4 MG PO TABS: 4.0000 mg | ORAL_TABLET | Freq: Four times a day (QID) | ORAL | Status: DC | PRN

## 2018-09-17 MED ORDER — LORAZEPAM 2 MG/ML IJ SOLN: 0.0000 mg | Freq: Two times a day (BID) | INTRAMUSCULAR | Status: DC

## 2018-09-17 MED ORDER — VITAMIN B-1 100 MG PO TABS
100.0000 mg | ORAL_TABLET | Freq: Every day | ORAL | Status: DC
  Administered 2018-09-18: 100 mg via ORAL
  Filled 2018-09-17: qty 1

## 2018-09-17 MED ORDER — NICOTINE 21 MG/24HR TD PT24
21.0000 mg | MEDICATED_PATCH | Freq: Every day | TRANSDERMAL | Status: DC
  Administered 2018-09-17 – 2018-09-18 (×2): 21 mg via TRANSDERMAL
  Filled 2018-09-17 (×2): qty 1

## 2018-09-17 MED ORDER — ACETAMINOPHEN 325 MG PO TABS: 650.0000 mg | ORAL_TABLET | Freq: Four times a day (QID) | ORAL | Status: DC | PRN

## 2018-09-17 MED ORDER — SODIUM CHLORIDE 0.9 % IV BOLUS
1000.0000 mL | Freq: Once | INTRAVENOUS | Status: AC
  Administered 2018-09-17: 1000 mL via INTRAVENOUS

## 2018-09-17 MED ORDER — ACETAMINOPHEN 650 MG RE SUPP: 650.0000 mg | Freq: Four times a day (QID) | RECTAL | Status: DC | PRN

## 2018-09-17 MED ORDER — ADULT MULTIVITAMIN W/MINERALS CH: 1.0000 | ORAL_TABLET | Freq: Every day | ORAL | Status: DC

## 2018-09-17 MED ORDER — PANTOPRAZOLE SODIUM 40 MG IV SOLR
40.0000 mg | Freq: Once | INTRAVENOUS | Status: DC
  Filled 2018-09-17: qty 40

## 2018-09-17 MED ORDER — SODIUM CHLORIDE 0.9 % IV SOLN
INTRAVENOUS | Status: DC
  Administered 2018-09-18 (×2): via INTRAVENOUS

## 2018-09-17 MED ORDER — ONDANSETRON HCL 4 MG/2ML IJ SOLN
4.0000 mg | Freq: Once | INTRAMUSCULAR | Status: AC
  Administered 2018-09-17: 4 mg via INTRAVENOUS
  Filled 2018-09-17: qty 2

## 2018-09-17 MED ORDER — LORAZEPAM 1 MG PO TABS
1.0000 mg | ORAL_TABLET | Freq: Once | ORAL | Status: AC
  Administered 2018-09-17: 1 mg via ORAL
  Filled 2018-09-17: qty 1

## 2018-09-17 MED ORDER — SODIUM CHLORIDE 0.9 % IV SOLN
8.0000 mg/h | INTRAVENOUS | Status: DC
  Administered 2018-09-17 – 2018-09-18 (×2): 8 mg/h via INTRAVENOUS
  Filled 2018-09-17 (×3): qty 80

## 2018-09-17 NOTE — H&P (Addendum)
[facility] [place] [facility]   PATIENT NAME: Terry Clark    MR#:  [id number]  DATE OF BIRTH:  [birth year]  DATE OF ADMISSION:  09/17/2018  PRIMARY CARE PHYSICIAN: Patient, [name]   REQUESTING/REFERRING PHYSICIAN:   CHIEF COMPLAINT:   Chief Complaint  Patient presents with  . Altered Mental Status    HISTORY OF PRESENT ILLNESS: Terry Clark  is a 48 y.o. male with a known history of alcohol abuse [place] highway with confusion.  Patient was brought to the emergency room.  He has a history of alcohol abuse and drinks alcohol on a regular basis.  Initially was involuntarily committed.  Psychiatry evaluated the patient in the emergency room and involuntary commitment was rescinded.  Patient vomited blood in the emergency room.  Alcohol level was very high.  He is agitated.  Patient's was given IV Protonix bolus.  PAST MEDICAL HISTORY:   Past Medical History:  Diagnosis Date  . Alcohol abuse   . Alcohol withdrawal seizure (HCC)     PAST SURGICAL HISTORY:  Past Surgical History:  Procedure Laterality Date  . TONSILLECTOMY      SOCIAL HISTORY:  Social History   Tobacco Use  . Smoking status: Current Every Day Smoker    Packs/day: 1.00  . Smokeless tobacco: Never Used  Substance Use Topics  . Alcohol use: Yes    Alcohol/week: 252.0 standard drinks    Types: 252 Cans of beer per week    Comment:  12 pack per day    FAMILY HISTORY:  Family History  Problem Relation Age of Onset  . Kidney failure Mother   . Cancer Other     DRUG ALLERGIES: No Known Allergies  REVIEW OF SYSTEMS:   CONSTITUTIONAL: No fever, fatigue or weakness.  EYES: No blurred or double vision.  EARS, NOSE, AND THROAT: No tinnitus or ear pain.  RESPIRATORY: No cough, shortness of breath, wheezing or hemoptysis.  CARDIOVASCULAR: No chest pain, orthopnea, edema.  GASTROINTESTINAL: No nausea, diarrhea Has mild abdominal pain  Vomited  blood GENITOURINARY: No dysuria, hematuria.  ENDOCRINE: No polyuria, nocturia,  HEMATOLOGY: No anemia, easy bruising or bleeding SKIN: No rash or lesion. MUSCULOSKELETAL: No joint pain or arthritis.   NEUROLOGIC: No tingling, numbness, weakness.  PSYCHIATRY: No anxiety or depression.   MEDICATIONS AT HOME:  Prior to Admission medications   Not on File      PHYSICAL EXAMINATION:   VITAL SIGNS: Blood pressure (!) 150/104, pulse 79, temperature (!) 97.5 F (36.4 C), temperature source Oral, resp. rate 18, weight 59 kg, SpO2 97 %.  GENERAL:  48 y.o.-year-old patient lying in the bed with no acute distress.  EYES: Pupils equal, round, reactive to light and accommodation. No scleral icterus. Extraocular muscles intact.  HEENT: Head atraumatic, normocephalic. Oropharynx and nasopharynx clear.  NECK:  Supple, no jugular venous distention. No thyroid enlargement, no tenderness.  LUNGS: Normal breath sounds bilaterally, no wheezing, rales,rhonchi or crepitation. No use of accessory muscles of respiration.  CARDIOVASCULAR: S1, S2 normal. No murmurs, rubs, or gallops.  ABDOMEN: Soft, mild tenderness in epigastrium, nondistended. Bowel sounds present. No organomegaly or mass.  EXTREMITIES: No pedal edema, cyanosis, or clubbing.  NEUROLOGIC: Cranial nerves II through XII are intact. Muscle strength 5/5 in all extremities. Sensation intact. Gait not checked.  PSYCHIATRIC: The patient is alert and oriented x 3.  SKIN: No obvious rash, lesion, or ulcer.   LABORATORY PANEL:   CBC Recent Labs  Lab 09/17/18 1420  WBC 5.3  HGB 15.3  HCT 45.9  PLT 198  MCV 98.3  MCH 32.8  MCHC 33.3  RDW 13.8   ------------------------------------------------------------------------------------------------------------------  Chemistries  Recent Labs  Lab 09/17/18 1420  NA 133*  K 3.6  CL 102  CO2 20*  GLUCOSE 85  BUN 10  CREATININE 0.80  CALCIUM 9.1  AST 145*  ALT 119*  ALKPHOS 91  BILITOT  1.0   ------------------------------------------------------------------------------------------------------------------ estimated creatinine clearance is 95.3 mL/min (by C-G formula based on SCr of 0.8 mg/dL). ------------------------------------------------------------------------------------------------------------------ No results for input(s): TSH, T4TOTAL, T3FREE, THYROIDAB in the last 72 hours.  Invalid input(s): FREET3   Coagulation profile No results for input(s): INR, PROTIME in the last 168 hours. ------------------------------------------------------------------------------------------------------------------- No results for input(s): DDIMER in the last 72 hours. -------------------------------------------------------------------------------------------------------------------  Cardiac Enzymes No results for input(s): CKMB, TROPONINI, MYOGLOBIN in the last 168 hours.  Invalid input(s): CK ------------------------------------------------------------------------------------------------------------------ Invalid input(s): POCBNP  ---------------------------------------------------------------------------------------------------------------  Urinalysis    Component Value Date/Time   COLORURINE YELLOW (A) 08/20/2018 2026   APPEARANCEUR CLEAR (A) 08/20/2018 2026   LABSPEC 1.012 08/20/2018 2026   PHURINE 5.0 08/20/2018 2026   GLUCOSEU NEGATIVE 08/20/2018 2026   HGBUR SMALL (A) 08/20/2018 2026   BILIRUBINUR NEGATIVE 08/20/2018 2026   KETONESUR NEGATIVE 08/20/2018 2026   PROTEINUR 30 (A) 08/20/2018 2026   NITRITE NEGATIVE 08/20/2018 2026   LEUKOCYTESUR NEGATIVE 08/20/2018 2026     RADIOLOGY: Ct Head Wo Contrast  Result Date: 09/17/2018 CLINICAL DATA:  Altered mental status, ETOH EXAM: CT HEAD WITHOUT CONTRAST TECHNIQUE: Contiguous axial images were obtained from the base of the skull through the vertex without intravenous contrast. COMPARISON:  07/30/2018 FINDINGS:  Brain: No evidence of acute infarction, hemorrhage, hydrocephalus, extra-axial collection or mass lesion/mass effect. Mild subcortical white matter and periventricular small vessel ischemic changes. Vascular: No hyperdense vessel or unexpected calcification. Skull: Normal. Negative for fracture or focal lesion. Sinuses/Orbits: The visualized paranasal sinuses are essentially clear. The mastoid air cells are unopacified. Other: None. IMPRESSION: No evidence of acute intracranial abnormality. Mild small vessel ischemic changes. Electronically Signed   By: Charline BillsSriyesh  Krishnan M.D.   On: 09/17/2018 15:05    EKG: Orders placed or performed during the hospital encounter of 07/30/18  . ED EKG  . ED EKG  . EKG 12-Lead  . EKG 12-Lead    IMPRESSION AND PLAN:  48 year old male patient with history of alcohol abuse presented to the emergency room for confusion and alcohol intoxication  -Acute gastrointestinal bleeding Admit patient to medical floor IV fluid hydration Start patient on Protonix drip intravenously Hemoglobin hematocrit monitoring closely Gastroenterology evaluation Monitor electrolytes Gastroenterology consultation placed via epic haiku  -Acute alcohol intoxication CIWA protocol Thiamine folic acid supplementation Banana bag IV  -DVT prophylaxis sequential compression device to lower extremities  -Tobacco abuse Tobacco cessation counseled to the patient for 6 minutes Nicotine patch offered  -Acute hyponatremia IV fluids All the records are reviewed and case discussed with ED provider. Management plans discussed with the patient, family and they are in agreement.  CODE STATUS:Full code Code Status History    Date Active Date Inactive Code Status Order ID Comments User Context   06/13/2014 1643 06/14/2014 1839 Full Code [id number]  Levie HeritageStinson, Jacob J, DO Inpatient       TOTAL TIME TAKING CARE OF THIS PATIENT: 52 minutes.    Ihor AustinPavan Lubna Stegeman M.D on 09/17/2018 at 5:43  PM  Between 7am to 6pm - Pager - [phone]  After 6pm go to  [email or web address] - password [email or web address]  [facility]  Office  [phone]  CC: Primary care physician; Patient, [name]

## 2018-09-17 NOTE — ED Notes (Signed)
Pt dressed out and occupying 19H in front of nurses station.

## 2018-09-17 NOTE — ED Notes (Signed)
Patient transported to CT with security 

## 2018-09-17 NOTE — Consult Note (Signed)
[facility] Face-to-Face Psychiatry Consult   Reason for Consult: Alcohol intoxication Referring Physician: Dr. Darnelle Catalan  Patient Identification: Terry Clark MRN:  [id number] Principal Diagnosis: Alcoholic intoxication with complication [facility]) Diagnosis:  Principal Problem:   Alcoholic intoxication with complication (HCC) Active Problems:   GI bleed   Total Time spent with patient: 45 minutes  Subjective: "I drink a lot just to have fun"  HPI: Terry Clark is a 48 y.o. male patient admitted with acute alcohol intoxication.  [place] and brought to the emergency department under involuntary commitment.  Blood alcohol level was elevated at 388.  Patient was sedated majority of shift.  Upon arousal psychiatry consult was requested to determine need for ongoing involuntary commitment.  Upon evaluation, patient is sitting awake and alert.  He is oriented to person place time and situation.  Patient endorses that he drinks up to a case of beer a day, and if he drinks less beer he adds 1/5 of liquor or 2.  Patient states that the last time he did a rehabilitation program was at [facility] in 2017 for 14 days.  He discharged to [facility] where he remained sober for another 22 days.  Once left to his own accord, he began drinking alcohol again.  Patient states he lives with his significant other and his 38 year old son.  He denies any problems with his living situation.  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  Patient is interested in resources for outpatient rehabilitation.  He does not feel like he is ready to quit drinking at this time.  Patient does have capacity and is aware of risks to his health from chronic alcohol use.  Patient denies suicidal ideation, he denies homicidal ideation, he denies auditory and visual hallucinations.  Past Psychiatric History: Alcohol use disorder, severe  Risk to Self:  No intent Risk to Others:  No intent Prior  Inpatient Therapy:  Denies psychiatric admission Prior Outpatient Therapy:  None  Past Medical History:  Past Medical History:  Diagnosis Date  . Alcohol abuse   . Alcohol withdrawal seizure [facility])     Past Surgical History:  Procedure Laterality Date  . TONSILLECTOMY     Family History:  Family History  Problem Relation Age of Onset  . Kidney failure Mother   . Cancer Other    Family Psychiatric  History: Denies  Social History:  Social History   Substance and Sexual Activity  Alcohol Use Yes  . Alcohol/week: 252.0 standard drinks  . Types: 252 Cans of beer per week   Comment:  12 pack per day     Social History   Substance and Sexual Activity  Drug Use Yes  . Types: Marijuana, Cocaine    Social History   Socioeconomic History  . Marital status: Divorced    Spouse name: Not on file  . Number of children: Not on file  . Years of education: Not on file  . Highest education level: Not on file  Occupational History  . Not on file  Social Needs  . Financial resource strain: Not on file  . Food insecurity:    Worry: Not on file    Inability: Not on file  . Transportation needs:    Medical: Not on file    Non-medical: Not on file  Tobacco Use  . Smoking status: Current Every Day Smoker    Packs/day: 1.00  . Smokeless tobacco: Never Used  Substance and  Sexual Activity  . Alcohol use: Yes    Alcohol/week: 252.0 standard drinks    Types: 252 Cans of beer per week    Comment:  12 pack per day  . Drug use: Yes    Types: Marijuana, Cocaine  . Sexual activity: Not on file  Lifestyle  . Physical activity:    Days per week: Not on file    Minutes per session: Not on file  . Stress: Not on file  Relationships  . Social connections:    Talks on phone: Not on file    Gets together: Not on file    Attends religious service: Not on file    Active member of club or organization: Not on file    Attends meetings of clubs or organizations: Not on file     Relationship status: Not on file  Other Topics Concern  . Not on file  Social History Narrative  . Not on file   Additional Social History:  Lives with significant other who drinks sparingly, and 76 year old son who abstains.  Allergies:  No Known Allergies  Labs:  Results for orders placed or performed during the hospital encounter of 09/17/18 (from the past 48 hour(s))  CBC     Status: None   Collection Time: 09/17/18  2:20 PM  Result Value Ref Range   WBC 5.3 4.0 - 10.5 K/uL   RBC 4.67 4.22 - 5.81 MIL/uL   Hemoglobin 15.3 13.0 - 17.0 g/dL   HCT 02.1 11.5 - 52.0 %   MCV 98.3 80.0 - 100.0 fL   MCH 32.8 26.0 - 34.0 pg   MCHC 33.3 30.0 - 36.0 g/dL   RDW 80.2 23.3 - 61.2 %   Platelets 198 150 - 400 K/uL   nRBC 0.0 0.0 - 0.2 %    Comment: Performed at [facility], [address] [place], [place] [address]  Comprehensive metabolic panel     Status: Abnormal   Collection Time: 09/17/18  2:20 PM  Result Value Ref Range   Sodium 133 (L) 135 - 145 mmol/L   Potassium 3.6 3.5 - 5.1 mmol/L   Chloride 102 98 - 111 mmol/L   CO2 20 (L) 22 - 32 mmol/L   Glucose, Bld 85 70 - 99 mg/dL   BUN 10 6 - 20 mg/dL   Creatinine, Ser 5.30 0.61 - 1.24 mg/dL   Calcium 9.1 8.9 - 05.1 mg/dL   Total Protein 8.7 (H) 6.5 - 8.1 g/dL   Albumin 4.2 3.5 - 5.0 g/dL   AST 102 (H) 15 - 41 U/L   ALT 119 (H) 0 - 44 U/L   Alkaline Phosphatase 91 38 - 126 U/L   Total Bilirubin 1.0 0.3 - 1.2 mg/dL   GFR calc non Af Amer >60 >60 mL/min   GFR calc Af Amer >60 >60 mL/min   Anion gap 11 5 - 15    Comment: Performed at [facility], [address], [place], [place] [address]  Ethanol     Status: Abnormal   Collection Time: 09/17/18  2:20 PM  Result Value Ref Range   Alcohol, Ethyl (B) 388 (HH) <10 mg/dL    Comment: CRITICAL RESULT CALLED TO, READ BACK BY AND VERIFIED WITH EMMA HUNTER 09/17/18  @ 1449  MLK (NOTE) Lowest detectable limit for serum alcohol is 10 mg/dL. For medical purposes  only. Performed at [facility], [address], [place], [place] [address]     No current facility-administered medications  for this encounter.    No current outpatient medications on file.    Musculoskeletal: Strength & Muscle Tone: within normal limits Gait & Station: unsteady Patient leans: N/A  Psychiatric Specialty Exam: Physical Exam  Nursing note and vitals reviewed. Constitutional: He is oriented to person, place, and time. He appears well-developed. No distress.  HENT:  Head: Normocephalic and atraumatic.  Eyes: Pupils are equal, round, and reactive to light. Right eye exhibits no discharge. Left eye exhibits no discharge.  Bilateral conjunctival injection   Neck: Normal range of motion.  Cardiovascular: Normal rate.  Respiratory: Effort normal.  Musculoskeletal: Normal range of motion.  Neurological: He is alert and oriented to person, place, and time.  Skin: Skin is warm and dry.    Review of Systems  Constitutional: Negative.   HENT: Negative.   Respiratory: Negative.   Cardiovascular: Negative.   Gastrointestinal: Negative.   Musculoskeletal: Negative.   Neurological: Negative.   Psychiatric/Behavioral: Positive for substance abuse (alcohol). Negative for depression, hallucinations, memory loss and suicidal ideas. The patient is not nervous/anxious and does not have insomnia.     Blood pressure (!) 150/104, pulse 79, temperature (!) 97.5 F (36.4 C), temperature source Oral, resp. rate 18, weight 59 kg, SpO2 97 %.Body mass index is 20.98 kg/m.  General Appearance: Fairly Groomed  Eye Contact:  Good  Speech:  Garbled and Normal Rate  Volume:  Normal  Mood:  Euthymic  Affect:  Appropriate  Thought Process:  Linear  Orientation:  Full (Time, Place, and Person)  Thought Content:  Logical and Hallucinations: None  Suicidal Thoughts:  No  Homicidal Thoughts:  No  Memory:  Immediate;   Limited Recent;   Fair Remote;   Fair  Judgement:   Impaired  Insight:  Shallow  Psychomotor Activity:  Normal  Concentration:  Concentration: Fair and Attention Span: Fair  Recall:  [employer]Fair  Fund of Knowledge:  Fair  Language:  Good  Akathisia:  No  Handed:  Right  AIMS (if indicated):   Not indicated  Assets:  Housing Intimacy Social Support  ADL's:  Intact  Cognition:  WNL  Sleep:   Adequate     Treatment Plan Summary: Plan Provide resources for outpatient alcohol rehabilitation programs  Disposition: No evidence of imminent risk to self or others at present.   Patient does not meet criteria for psychiatric inpatient admission. Supportive therapy provided about ongoing stressors. Discussed crisis plan, support from social network, calling 911, coming to the Emergency Department, and calling Suicide Hotline.  Mariel CraftSHEILA M Jilliam Bellmore, MD 09/17/2018 5:20 PM

## 2018-09-17 NOTE — ED Notes (Signed)
Vol going to be admitted medically  Rescinded by  Dr. Rebecca EatonMauer

## 2018-09-17 NOTE — Discharge Instructions (Signed)
Please follow-up with [facility] if you want help with getting off alcohol or you can follow-up with AA.  That is probably the best long-term help we have to get off alcohol.  Please return here for any further problems.  Try to refrain from drinking as much as possible.  [facility] and [facility] contact information is on the other sheets of paper that we included with your discharge instructions

## 2018-09-17 NOTE — ED Notes (Signed)
Pt started vomiting in floor of hallway.  Pt began vomiting blood. Dr Darnelle Catalan aware and at bedside. New orders received.

## 2018-09-17 NOTE — ED Notes (Signed)
During initial assessment, Pt states that he drinks every day and all he wants is to stop drinking and get well. He wants help to get started with his recovery.

## 2018-09-17 NOTE — ED Provider Notes (Signed)
Patient begins vomiting some material he ate earlier.  This rapidly becomes bloody vomitus.  We will get a type and cross start him on some Protonix get him in the hospital.  We had planned on discharging him with follow-up with [facility] and [facility] but this will not be possible now.   Arnaldo Natal, MD 09/17/18 1729

## 2018-09-17 NOTE — ED Provider Notes (Addendum)
[facility] Emergency Department Provider Note  ____________________________________________  Time seen: Approximately 2:32 PM  I have reviewed the triage vital signs and the nursing notes.   HISTORY  Chief Complaint Altered Mental Status    HPI Terry Clark is a 48 y.o. male with a history of alcohol dependence and prior alcohol withdrawal seizures brought by EMS after having been found on the highway with nonsensical thinking and anger issues.  EMS was unable to perform any vital signs or get a blood sugar due to the patient inability to comply.  The patient states that he is angry but will not give more for details.  He does state that he has been drinking alcohol.  He denies any SI, HI or hallucinations.  Past Medical History:  Diagnosis Date  . Alcohol abuse   . Alcohol withdrawal seizure [facility])     Patient Active Problem List   Diagnosis Date Noted  . Alcohol withdrawal seizure (HCC) 06/13/2014  . 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 TONSILLECTOMY        Allergies Patient has no known allergies.  Family History  Problem Relation Age of Onset  . Kidney failure Mother   . Cancer Other     Social History Social History   Tobacco Use  . Smoking status: Current Every Day Smoker    Packs/day: 1.00  . Smokeless tobacco: Never Used  Substance Use Topics  . Alcohol use: Yes    Alcohol/week: 252.0 standard drinks    Types: 252 Cans of beer per week    Comment:  12 pack per day  . Drug use: Yes    Types: Marijuana, Cocaine    Review of Systems Review of systems is limited as the patient is not able to or not willing to answer most of my questions.  He denies any medical complaints.  He reports alcohol use.   ____________________________________________   PHYSICAL EXAM:  VITAL SIGNS: ED Triage Vitals  Enc Vitals Group     BP 09/17/18 1414 (!) 150/104     Pulse Rate  09/17/18 1414 79     Resp 09/17/18 1414 18     Temp 09/17/18 1414 (!) 97.5 F (36.4 C)     Temp Source 09/17/18 1414 Oral     SpO2 09/17/18 1414 97 %     Weight 09/17/18 1413 130 lb (59 kg)     Height --      Head Circumference --      Peak Flow --      Pain Score 09/17/18 1413 10     Pain Loc --      Pain Edu? --      Excl. in GC? --     Constitutional: The patient is alert, able to answer questions appropriately.  He is stating that he is angry but is able to follow directions. Eyes: Bilateral subconjunctival erythema.  EOMI. No scleral icterus.  No raccoon eyes. Head: Atraumatic.  No battle sign. Nose: No congestion/rhinnorhea.  No swelling of the nose or septal hematoma. Mouth/Throat: Mucous membranes are moist.  No malocclusion or dental injury. Neck: No stridor.  Supple.  No midline C-spine tenderness to palpation, step-offs or deformities. Cardiovascular: Normal rate, regular rhythm. No murmurs, rubs or gallops.  Respiratory: Normal respiratory effort.  No accessory muscle use or retractions. Lungs CTAB.  No wheezes, rales or ronchi. Musculoskeletal: Moves all extremities well.. Neurologic:  A&Ox3.  Speech is  clear.  Face and smile are symmetric.  EOMI.  Moves all extremities well. Skin:  Skin is warm, dry and intact. No rash noted. Psychiatric: The patient does appear angry but denies any SI, HI or hallucinations.  He does endorse alcohol intoxication.  ____________________________________________   LABS (all labs ordered are listed, but only abnormal results are displayed)  Labs Reviewed  COMPREHENSIVE METABOLIC PANEL - Abnormal; Notable for the following components:      Result Value   Sodium 133 (*)    CO2 20 (*)    Total Protein 8.7 (*)    AST 145 (*)    ALT 119 (*)    All other components within normal limits  CBC  DRUG SCREEN 10 W/CONF, SERUM  ETHANOL  CBG MONITORING, ED   ____________________________________________  EKG  Not  indicated ____________________________________________  RADIOLOGY  No results found.  ____________________________________________   PROCEDURES  Procedure(s) performed: None  Procedures  Critical Care performed: No ____________________________________________   INITIAL IMPRESSION / ASSESSMENT AND PLAN / ED COURSE  Pertinent labs & imaging results that were available during my care of the patient were reviewed by me and considered in my medical decision making (see chart for details).  48 y.o. male with a history of alcohol abuse and prior alcohol seizures presenting after he was found on the highway, intoxicated.  Overall, the patient is angry and has a blood pressure of 150/104; this will be rechecked once he is feeling more calm.  We are waiting drug screens.  I do not see evidence of any injury today, except for bilateral subconjunctival hemorrhages.  We will get a CT of the head as he is an unreliable patient.  CBG is pending.  Plan reevaluation for final disposition.  The patient has been placed under involuntary commitment and a psychiatry consultation has been placed.  ----------------------------------------- 3:08 PM on 09/17/2018 -----------------------------------------  The patient CT head does not show any acute intracranial process.  His alcohol level is 388 today, which is lower than his usual blood alcohol level when he presents to the emergency department.  I will place him on CIWA protocol.  The patient did have a brief fall, which I did not witness without any loss of consciousness.  He has a minimal abrasion to the elbow but otherwise has no evidence of injury and was able to stand back up immediately and has ambulated safely since then.  At this time, the patient is medically cleared for psychiatric disposition.  ____________________________________________  FINAL CLINICAL IMPRESSION(S) / ED DIAGNOSES  Final diagnoses:  Alcoholic intoxication with  complication (HCC)  Anger         NEW MEDICATIONS STARTED DURING THIS VISIT:  New Prescriptions   No medications on file      Rockne Menghini, MD 09/17/18 1447    Rockne Menghini, MD 09/17/18 [phone]

## 2018-09-17 NOTE — BH Assessment (Addendum)
1st TTS Attempt Unsuccessful: TTS attempted to assess pt; pt would not open his eyes nor would he respond to his name being called 5x.

## 2018-09-17 NOTE — ED Notes (Signed)
Psychiatry at bedside.

## 2018-09-17 NOTE — ED Notes (Addendum)
Pt attempting to get out of bed to use bathroom.  Assisted to bathroom with Engineer, materialssecurity officer.  Per officer colozzi pt fell on way to bathroom. He reports pt did not hit head and landed on buttocks. Pt able to get up without issue and use restroom.  Consulting civil engineerCharge RN and doctor Sharma Covertorman notified. This Rn did not witness fall.

## 2018-09-17 NOTE — ED Notes (Signed)
Pt ate lunch tray  

## 2018-09-17 NOTE — ED Triage Notes (Signed)
Pt found on side ramp to high way and someone called 911.  Pt admits to heavy ETOH. Denies drug use. Denies SI. Unlabored. NAD. VSS.

## 2018-09-17 NOTE — ED Notes (Signed)
ED TO INPATIENT HANDOFF REPORT  Name/Age/Gender Terry Clark 48 y.o. male  Code Status Code Status History    Date Active Date Inactive Code Status Order ID Comments User Context   06/13/2014 1643 06/14/2014 1839 Full Code [id number]  Levie Heritage, DO Inpatient      Home/SNF/Other Home  Chief Complaint altered mental status ems  Level of Care/Admitting Diagnosis ED Disposition    ED Disposition Condition Comment   Admit  Hospital Area: [facility] [[id number]]  Level of Care: Med-Surg [16]  Diagnosis: GI bleed [767341]  Admitting Physician: Ihor Austin [[id number]]  Attending Physician: Ihor Austin [[id number]]  Estimated length of stay: past midnight tomorrow  Certification:: I certify this patient will need inpatient services for at least 2 midnights  PT Class (Do Not Modify): Inpatient [101]  PT Acc Code (Do Not Modify): Private [1]       Medical History Past Medical History:  Diagnosis Date  . Alcohol abuse   . Alcohol withdrawal seizure (HCC)     Allergies No Known Allergies  IV Location/Drains/Wounds Patient Lines/Drains/Airways Status   Active Line/Drains/Airways    Name:   Placement date:   Placement time:   Site:   Days:   Peripheral IV 08/20/18 Left Forearm   08/20/18    1900    Forearm   28   Peripheral IV 09/17/18 Right Forearm   09/17/18    1640    Forearm   less than 1   Peripheral IV 09/17/18 Left Forearm   09/17/18    1804    Forearm   less than 1          Labs/Imaging Results for orders placed or performed during the hospital encounter of 09/17/18 (from the past 48 hour(s))  CBC     Status: None   Collection Time: 09/17/18  2:20 PM  Result Value Ref Range   WBC 5.3 4.0 - 10.5 K/uL   RBC 4.67 4.22 - 5.81 MIL/uL   Hemoglobin 15.3 13.0 - 17.0 g/dL   HCT 32.9 92.4 - 26.8 %   MCV 98.3 80.0 - 100.0 fL   MCH 32.8 26.0 - 34.0 pg   MCHC 33.3 30.0 - 36.0 g/dL   RDW 34.1 96.2 - 22.9 %   Platelets 198 150 - 400 K/uL   nRBC 0.0 0.0 - 0.2 %    Comment: Performed at [facility], [address] [place], [place] [address]  Comprehensive metabolic panel     Status: Abnormal   Collection Time: 09/17/18  2:20 PM  Result Value Ref Range   Sodium 133 (L) 135 - 145 mmol/L   Potassium 3.6 3.5 - 5.1 mmol/L   Chloride 102 98 - 111 mmol/L   CO2 20 (L) 22 - 32 mmol/L   Glucose, Bld 85 70 - 99 mg/dL   BUN 10 6 - 20 mg/dL   Creatinine, Ser 1.19 0.61 - 1.24 mg/dL   Calcium 9.1 8.9 - 41.7 mg/dL   Total Protein 8.7 (H) 6.5 - 8.1 g/dL   Albumin 4.2 3.5 - 5.0 g/dL   AST 408 (H) 15 - 41 U/L   ALT 119 (H) 0 - 44 U/L   Alkaline Phosphatase 91 38 - 126 U/L   Total Bilirubin 1.0 0.3 - 1.2 mg/dL   GFR calc non Af Amer >60 >60 mL/min   GFR calc Af Amer >60 >60 mL/min   Anion gap 11 5 - 15    Comment: Performed at  [facility], [address] [place], [place] [address]  Ethanol     Status: Abnormal   Collection Time: 09/17/18  2:20 PM  Result Value Ref Range   Alcohol, Ethyl (B) 388 (HH) <10 mg/dL    Comment: CRITICAL RESULT CALLED TO, READ BACK BY AND VERIFIED WITH EMMA HUNTER 09/17/18  @ 1449  MLK (NOTE) Lowest detectable limit for serum alcohol is 10 mg/dL. For medical purposes only. Performed at [facility], [address] [place], [place] [address]   Type and screen [facility]     Status: None   Collection Time: 09/17/18  5:57 PM  Result Value Ref Range   ABO/RH(D) O POS    Antibody Screen NEG    Sample Expiration      09/20/2018 Performed at [facility], [address] [place], [place] [address]   CBC     Status: Abnormal   Collection Time: 09/17/18  5:57 PM  Result Value Ref Range   WBC 3.5 (L) 4.0 - 10.5 K/uL   RBC 4.42 4.22 - 5.81 MIL/uL   Hemoglobin 14.4 13.0 - 17.0 g/dL   HCT 21.342.9 08.639.0 - 57.852.0 %   MCV 97.1 80.0 - 100.0 fL   MCH 32.6 26.0 - 34.0 pg   MCHC 33.6 30.0 - 36.0 g/dL   RDW 46.913.7 62.911.5 - 52.815.5 %   Platelets 179 150 - 400 K/uL    nRBC 0.0 0.0 - 0.2 %    Comment: Performed at [facility], [address] [place], [place] [address]   Ct Head Wo Contrast  Result Date: 09/17/2018 CLINICAL DATA:  Altered mental status, ETOH EXAM: CT HEAD WITHOUT CONTRAST TECHNIQUE: Contiguous axial images were obtained from the base of the skull through the vertex without intravenous contrast. COMPARISON:  07/30/2018 FINDINGS: Brain: No evidence of acute infarction, hemorrhage, hydrocephalus, extra-axial collection or mass lesion/mass effect. Mild subcortical white matter and periventricular small vessel ischemic changes. Vascular: No hyperdense vessel or unexpected calcification. Skull: Normal. Negative for fracture or focal lesion. Sinuses/Orbits: The visualized paranasal sinuses are essentially clear. The mastoid air cells are unopacified. Other: None. IMPRESSION: No evidence of acute intracranial abnormality. Mild small vessel ischemic changes. Electronically Signed   By: Charline BillsSriyesh  Krishnan M.D.   On: 09/17/2018 15:05    Pending Labs Unresulted Labs (From admission, onward)    Start     Ordered   09/17/18 2100  Hemoglobin and hematocrit, blood  Now then every 6 hours,   STAT     09/17/18 1732   09/17/18 1413  Drug Screen 10 W/Conf, Serum  Once,   STAT     09/17/18 1412   Signed and Held  HIV antibody (Routine Testing)  Once,   R     Signed and Held   Signed and Held  Basic metabolic panel  Tomorrow morning,   R     Signed and Held          Vitals/Pain Today's Vitals   09/17/18 1413 09/17/18 1414 09/17/18 1800 09/17/18 1942  BP:  (!) 150/104 104/67 106/69  Pulse:  79 79 75  Resp:  18 15 18   Temp:  (!) 97.5 F (36.4 C)  97.8 F (36.6 C)  TempSrc:  Oral  Oral  SpO2:  97% 97% 96%  Weight: 59 kg     PainSc: 10-Worst pain ever       Isolation Precautions No active isolations  Medications Medications  pantoprazole (PROTONIX) injection 40  mg (40 mg Intravenous Not Given 09/17/18 1735)  pantoprazole (PROTONIX) 80  mg in sodium chloride 0.9 % 250 mL (0.32 mg/mL) infusion (8 mg/hr Intravenous New Bag/Given 09/17/18 1854)  LORazepam (ATIVAN) tablet 1 mg (has no administration in time range)    Or  LORazepam (ATIVAN) injection 1 mg (has no administration in time range)  thiamine (VITAMIN B-1) tablet 100 mg ( Oral See Alternative 09/17/18 1821)    Or  thiamine (B-1) injection 100 mg (100 mg Intravenous Not Given 09/17/18 1821)  folic acid (FOLVITE) tablet 1 mg (1 mg Oral Not Given 09/17/18 1823)  multivitamin with minerals tablet 1 tablet (1 tablet Oral Not Given 09/17/18 1823)  LORazepam (ATIVAN) injection 0-4 mg (0 mg Intravenous Not Given 09/17/18 1827)    Followed by  LORazepam (ATIVAN) injection 0-4 mg (has no administration in time range)  nicotine (NICODERM CQ - dosed in mg/24 hours) patch 21 mg (21 mg Transdermal Patch Applied 09/17/18 1837)  LORazepam (ATIVAN) tablet 1 mg (1 mg Oral Given 09/17/18 1447)  ondansetron (ZOFRAN) injection 4 mg (4 mg Intravenous Given 09/17/18 1740)  sodium chloride 0.9 % 1,000 mL with thiamine 100 mg, folic acid 1 mg, multivitamins adult 10 mL infusion ( Intravenous New Bag/Given 09/17/18 1847)  sodium chloride 0.9 % bolus 1,000 mL (0 mLs Intravenous Stopped 09/17/18 1845)    Mobility unstable

## 2018-09-17 NOTE — ED Notes (Signed)
Called pharmacy x2 about 80mg  protonix bolus and it has not yet arrived.

## 2018-09-18 ENCOUNTER — Inpatient Hospital Stay: Payer: Self-pay | Admitting: Anesthesiology

## 2018-09-18 ENCOUNTER — Encounter
Admission: EM | Disposition: A | Discharge status: Home / Self Care | Payer: Self-pay | Source: Home / Self Care | Attending: Internal Medicine

## 2018-09-18 DIAGNOSIS — K208 Other esophagitis: Secondary | ICD-10-CM

## 2018-09-18 DIAGNOSIS — F10929 Alcohol use, unspecified with intoxication, unspecified: Secondary | ICD-10-CM

## 2018-09-18 DIAGNOSIS — K449 Diaphragmatic hernia without obstruction or gangrene: Secondary | ICD-10-CM

## 2018-09-18 DIAGNOSIS — K920 Hematemesis: Principal | ICD-10-CM

## 2018-09-18 HISTORY — PX: ESOPHAGOGASTRODUODENOSCOPY: SHX5428

## 2018-09-18 LAB — BASIC METABOLIC PANEL
Anion gap: 10 (ref 5–15)
BUN: 9 mg/dL (ref 6–20)
CO2: 23 mmol/L (ref 22–32)
Calcium: 8.2 mg/dL — ABNORMAL LOW (ref 8.9–10.3)
Chloride: 106 mmol/L (ref 98–111)
Creatinine, Ser: 0.76 mg/dL (ref 0.61–1.24)
GFR calc Af Amer: >60 mL/min (ref >60)
GFR calc non Af Amer: >60 mL/min (ref >60)
Glucose, Bld: 91 mg/dL (ref 70–99)
Potassium: 3.4 mmol/L — ABNORMAL LOW (ref 3.5–5.1)
Sodium: 139 mmol/L (ref 135–145)

## 2018-09-18 LAB — HEMOGLOBIN AND HEMATOCRIT, BLOOD
HCT: 36 % — ABNORMAL LOW (ref 39.0–52.0)
HCT: 36.9 % — ABNORMAL LOW (ref 39.0–52.0)
HCT: 39 % (ref 39.0–52.0)
Hemoglobin: 11.7 g/dL — ABNORMAL LOW (ref 13.0–17.0)
Hemoglobin: 12.3 g/dL — ABNORMAL LOW (ref 13.0–17.0)
Hemoglobin: 12.7 g/dL — ABNORMAL LOW (ref 13.0–17.0)

## 2018-09-18 SURGERY — EGD (ESOPHAGOGASTRODUODENOSCOPY)
Anesthesia: General

## 2018-09-18 MED ORDER — PROPOFOL 10 MG/ML IV BOLUS
INTRAVENOUS | Status: DC | PRN
  Administered 2018-09-18: 90 mg via INTRAVENOUS
  Administered 2018-09-18: 20 mg via INTRAVENOUS
  Administered 2018-09-18: 30 mg via INTRAVENOUS
  Administered 2018-09-18: 20 mg via INTRAVENOUS

## 2018-09-18 MED ORDER — PANTOPRAZOLE SODIUM 40 MG PO TBEC: 40.0000 mg | DELAYED_RELEASE_TABLET | Freq: Two times a day (BID) | ORAL | 0 refills | Status: DC

## 2018-09-18 MED ORDER — FENTANYL CITRATE (PF) 100 MCG/2ML IJ SOLN
INTRAMUSCULAR | Status: DC | PRN
  Administered 2018-09-18 (×2): 50 ug via INTRAVENOUS

## 2018-09-18 MED ORDER — POTASSIUM CHLORIDE 10 MEQ/100ML IV SOLN
10.0000 meq | INTRAVENOUS | Status: AC
  Administered 2018-09-18 (×4): 10 meq via INTRAVENOUS
  Filled 2018-09-18 (×4): qty 100

## 2018-09-18 MED ORDER — FENTANYL CITRATE (PF) 100 MCG/2ML IJ SOLN
INTRAMUSCULAR | Status: AC
  Filled 2018-09-18: qty 2

## 2018-09-18 NOTE — Progress Notes (Signed)
Patient arrived from endoscopy

## 2018-09-18 NOTE — Progress Notes (Signed)
[employer] -  at [facility]   PATIENT NAME: Terry Clark    MR#:  [id number]  DATE OF BIRTH:  December 02, 1970  SUBJECTIVE:  Patient here with alcohol intoxication and had an episode of vomiting blood. He has not vomited blood since admission  REVIEW OF SYSTEMS:    Review of Systems  Constitutional: Negative for fever, chills weight loss HENT: Negative for ear pain, nosebleeds, congestion, facial swelling, rhinorrhea, neck pain, neck stiffness and ear discharge.   Respiratory: Negative for cough, shortness of breath, wheezing  Cardiovascular: Negative for chest pain, palpitations and leg swelling.  Gastrointestinal: Negative for heartburn, abdominal pain, vomiting, diarrhea or consitpation Genitourinary: Negative for dysuria, urgency, frequency, hematuria Musculoskeletal: Negative for back pain or joint pain Neurological: Negative for dizziness, seizures, syncope, focal weakness,  numbness and headaches.  Hematological: Does not bruise/bleed easily.  Psychiatric/Behavioral: Negative for hallucinations, confusion, dysphoric mood    Tolerating Diet: npo      DRUG ALLERGIES:  No Known Allergies  VITALS:  Blood pressure (!) 152/94, pulse 73, temperature 99.2 F (37.3 C), temperature source Oral, resp. rate 18, height 5\' 6"  (1.676 m), weight 62.7 kg, SpO2 98 %.  PHYSICAL EXAMINATION:  Constitutional: Appears well-developed and well-nourished. No distress. HENT: Normocephalic. Marland Kitchen Oropharynx is clear and moist.  Eyes: Conjunctivae and EOM are normal. PERRLA, no scleral icterus.  Neck: Normal ROM. Neck supple. No JVD. No tracheal deviation. CVS: RRR, S1/S2 +, no murmurs, no gallops, no carotid bruit.  Pulmonary: Effort and breath sounds normal, no stridor, rhonchi, wheezes, rales.  Abdominal: Soft. BS +,  no distension, tenderness, rebound or guarding.  Musculoskeletal: Normal range of motion. No edema and no tenderness.  Neuro: Alert. CN 2-12 grossly  intact. No focal deficits. Skin: Skin is warm and dry. No rash noted. Psychiatric: Normal mood and affect.      LABORATORY PANEL:   CBC Recent Labs  Lab 09/17/18 1757  09/18/18 0909  WBC 3.5*  --   --   HGB 14.4   < > 12.3*  HCT 42.9   < > 36.9*  PLT 179  --   --    < > = values in this interval not displayed.   ------------------------------------------------------------------------------------------------------------------  Chemistries  Recent Labs  Lab 09/17/18 1420 09/18/18 0244  NA 133* 139  K 3.6 3.4*  CL 102 106  CO2 20* 23  GLUCOSE 85 91  BUN 10 9  CREATININE 0.80 0.76  CALCIUM 9.1 8.2*  AST 145*  --   ALT 119*  --   ALKPHOS 91  --   BILITOT 1.0  --    ------------------------------------------------------------------------------------------------------------------  Cardiac Enzymes No results for input(s): TROPONINI in the last 168 hours. ------------------------------------------------------------------------------------------------------------------  RADIOLOGY:  Ct Head Wo Contrast  Result Date: 09/17/2018 CLINICAL DATA:  Altered mental status, ETOH EXAM: CT HEAD WITHOUT CONTRAST TECHNIQUE: Contiguous axial images were obtained from the base of the skull through the vertex without intravenous contrast. COMPARISON:  07/30/2018 FINDINGS: Brain: No evidence of acute infarction, hemorrhage, hydrocephalus, extra-axial collection or mass lesion/mass effect. Mild subcortical white matter and periventricular small vessel ischemic changes. Vascular: No hyperdense vessel or unexpected calcification. Skull: Normal. Negative for fracture or focal lesion. Sinuses/Orbits: The visualized paranasal sinuses are essentially clear. The mastoid air cells are unopacified. Other: None. IMPRESSION: No evidence of acute intracranial abnormality. Mild small vessel ischemic changes. Electronically Signed   By: Charline Bills M.D.   On: 09/17/2018 15:05     ASSESSMENT AND PLAN:  48 year old male with history of EtOH and tobacco dependence who was [place] the side of the highway and brought to the ER for further evaluation.   1.  Hematemesis: Patient has not had any episodes of vomiting blood since admission. GI consultation Continue PPI N.p.o. until GI evaluation Hemoglobin has gone from 15.3-12.3.  No indication for blood transfusion 2.  EtOH intoxication: Continue CIWA protocol  3.Tobacco dependence: Patient is encouraged to quit smoking. Counseling was provided for 4 minutes.  4.  Hyponatremia: This is due to dehydration and has improved with IV fluids  5.  Hypokalemia: Replete   Management plans discussed with the patient and he is in agreement.  CODE STATUS: full  TOTAL TIME TAKING CARE OF THIS PATIENT: 30 minutes.     POSSIBLE D/C tomorrow, DEPENDING ON CLINICAL CONDITION.   Gaila Engebretsen M.D on 09/18/2018 at 11:27 AM  Between 7am to 6pm - Pager - [phone] After 6pm go to [email or web address] - password [email or web address]  [facility]  Office  [phone]  CC: Primary care physician; Patient, [name]  Note: This dictation was prepared with Dragon dictation along with smaller phrase technology. Any transcriptional errors that result from this process are unintentional.

## 2018-09-18 NOTE — Anesthesia Preprocedure Evaluation (Signed)
Anesthesia Evaluation  Patient identified by MRN, date of birth, ID band Patient awake    Reviewed: Allergy & Precautions, NPO status , Patient's Chart, lab work & pertinent test results  History of Anesthesia Complications Negative for: history of anesthetic complications  Airway Mallampati: II  TM Distance: >3 FB Neck ROM: Full    Dental  (+) Poor Dentition   Pulmonary neg sleep apnea, neg COPD, Current Smoker,    breath sounds clear to auscultation- rhonchi (-) wheezing      Cardiovascular Exercise Tolerance: Good (-) hypertension(-) CAD, (-) Past MI, (-) Cardiac Stents and (-) CABG  Rhythm:Regular Rate:Normal - Systolic murmurs and - Diastolic murmurs    Neuro/Psych neg Seizures PSYCHIATRIC DISORDERS (EtOH abuse)    GI/Hepatic negative GI ROS, (+) Hepatitis - (alchoholic)  Endo/Other  negative endocrine ROSneg diabetes  Renal/GU negative Renal ROS     Musculoskeletal negative musculoskeletal ROS (+)   Abdominal (+) - obese,   Peds  Hematology negative hematology ROS (+)   Anesthesia Other Findings Past Medical History: No date: Alcohol abuse No date: Alcohol withdrawal seizure (HCC)   Reproductive/Obstetrics                             Anesthesia Physical Anesthesia Plan  ASA: II  Anesthesia Plan: General   Post-op Pain Management:    Induction: Intravenous  PONV Risk Score and Plan: 0 and Propofol infusion  Airway Management Planned: Natural Airway  Additional Equipment:   Intra-op Plan:   Post-operative Plan:   Informed Consent: I have reviewed the patients History and Physical, chart, labs and discussed the procedure including the risks, benefits and alternatives for the proposed anesthesia with the patient or authorized representative who has indicated his/her understanding and acceptance.   Dental advisory given  Plan Discussed with: CRNA and  Anesthesiologist  Anesthesia Plan Comments:         Anesthesia Quick Evaluation

## 2018-09-18 NOTE — Consult Note (Signed)
Terry Darby, MD [address]  [place]  [place], [place] [address]  Main: [phone]  Fax: [phone] Pager: [phone]   Consultation  Referring Provider:     No ref. provider found Primary Care Physician:  Patient, [name] Primary Gastroenterologist: None      Reason for Consultation:     Hematemesis  Date of Admission:  09/17/2018 Date of Consultation:  09/18/2018         HPI:   Terry Clark is a 48 y.o. male with history of heavy alcohol use, was [place] ramp on Highway, brought to ER by 911 yesterday.  He admits drinking heavy alcohol.  Patient has history of alcohol withdrawal seizures.  While in the ER, he had nonbloody emesis initially did contain food particles followed by bloody emesis.  He was started on Protonix drip and admitted for further management.  GI is consulted due to hematemesis.  His hemoglobin on admission was 13, decreased to 12.3 today, normal platelets, LFTs reveal elevated transaminases, normal bilirubin.  Serum alcohol levels are elevated, U tox was negative.  Patient is kept n.p.o. for potential endoscopic evaluation.  His BUN and creatinine are normal.  No known history of cirrhosis.  He reports having episode of hematemesis about 3 months ago.  He denies epigastric pain but reports right upper quadrant tenderness He is unemployed, lives with his girlfriend, looking for a job  NSAIDs: None  Antiplts/Anticoagulants/Anti thrombotics: None  GI Procedures: None Father passed away from pancreatic cancer at age 19  Past Medical History:  Diagnosis Date  . Alcohol abuse   . Alcohol withdrawal seizure [facility])     Past Surgical History:  Procedure Laterality Date  . TONSILLECTOMY      Prior to Admission medications   Not on File    Family History  Problem Relation Age of Onset  . Kidney failure Mother   . Cancer Other      Social History   Tobacco Use  . Smoking status: Current Every Day Smoker    Packs/day: 1.00    . Smokeless tobacco: Never Used  Substance Use Topics  . Alcohol use: Yes    Alcohol/week: 252.0 standard drinks    Types: 252 Cans of beer per week    Comment:  12 pack per day  . Drug use: Yes    Types: Marijuana, Cocaine    Allergies as of 09/17/2018  . (No Known Allergies)    Review of Systems:    All systems reviewed and negative except where noted in HPI.   Physical Exam:  Vital signs in last 24 hours: Temp:  [97.5 F (36.4 C)-99.2 F (37.3 C)] 97.7 F (36.5 C) (01/08 1303) Pulse Rate:  [57-79] 60 (01/08 1303) Resp:  [15-19] 18 (01/08 1303) BP: (104-178)/(67-104) 178/100 (01/08 1303) SpO2:  [96 %-99 %] 99 % (01/08 1303) Weight:  [59 kg-62.7 kg] 62.7 kg (01/07 2024) Last BM Date: 09/17/18 General:   Pleasant, cooperative in NAD Head:  Normocephalic and atraumatic. Eyes:   No icterus.   Conjunctiva pink, hemorrhage in the bulbar conjunctiva, bilateral. PERRLA. Ears:  Normal auditory acuity. Neck:  Supple; no masses or thyroidomegaly Lungs: Respirations even and unlabored. Lungs clear to auscultation bilaterally.   No wheezes, crackles, or rhonchi.  Heart:  Regular rate and rhythm;  Without murmur, clicks, rubs or gallops Abdomen:  Soft, nondistended, moderate right upper quadrant tenderness. Normal bowel sounds. No appreciable masses or hepatomegaly.  No  rebound or guarding.  Rectal:  Not performed. Msk:  Symmetrical without gross deformities.  Strength normal Extremities:  Without edema, cyanosis or clubbing. Neurologic:  Alert and oriented x3;  grossly normal neurologically. Skin:  Intact without significant lesions or rashes. Psych:  Alert and cooperative. Normal affect.  LAB RESULTS: CBC Latest Ref Rng & Units 09/18/2018 09/18/2018 09/17/2018  WBC 4.0 - 10.5 K/uL - - -  Hemoglobin 13.0 - 17.0 g/dL 12.3(L) 11.7(L) 13.1  Hematocrit 39.0 - 52.0 % 36.9(L) 36.0(L) 39.7  Platelets 150 - 400 K/uL - - -    BMET BMP Latest Ref Rng & Units 09/18/2018 09/17/2018 08/20/2018   Glucose 70 - 99 mg/dL 91 85 110(H)  BUN 6 - 20 mg/dL _0 Creatinine 0.61 - 1.24 mg/dL 0.76 0.80 0.81  Sodium 135 - 145 mmol/L 139 133(L) 142  Potassium 3.5 - 5.1 mmol/L 3.4(L) 3.6 3.6  Chloride 98 - 111 mmol/L 106 102 105  CO2 22 - 32 mmol/L 23 20(L) 28  Calcium 8.9 - 10.3 mg/dL 8.2(L) 9.1 8.2(L)    LFT Hepatic Function Latest Ref Rng & Units 09/17/2018 08/20/2018 08/20/2018  Total Protein 6.5 - 8.1 g/dL 8.7(H) 8.4(H) 8.4(H)  Albumin 3.5 - 5.0 g/dL 4.2 4.4 4.4  AST 15 - 41 U/L 145(H) 217(H) 233(H)  ALT 0 - 44 U/L 119(H) 147(H) 152(H)  Alk Phosphatase 38 - 126 U/L 91 77 77  Total Bilirubin 0.3 - 1.2 mg/dL 1.0 0.5 1.1  Bilirubin, Direct 0.0 - 0.3 mg/dL - - -     STUDIES: Ct Head Wo Contrast  Result Date: 09/17/2018 CLINICAL DATA:  Altered mental status, ETOH EXAM: CT HEAD WITHOUT CONTRAST TECHNIQUE: Contiguous axial images were obtained from the base of the skull through the vertex without intravenous contrast. COMPARISON:  07/30/2018 FINDINGS: Brain: No evidence of acute infarction, hemorrhage, hydrocephalus, extra-axial collection or mass lesion/mass effect. Mild subcortical white matter and periventricular small vessel ischemic changes. Vascular: No hyperdense vessel or unexpected calcification. Skull: Normal. Negative for fracture or focal lesion. Sinuses/Orbits: The visualized paranasal sinuses are essentially clear. The mastoid air cells are unopacified. Other: None. IMPRESSION: No evidence of acute intracranial abnormality. Mild small vessel ischemic changes. Electronically Signed   By: Julian Hy M.D.   On: 09/17/2018 15:05      Impression / Plan:   Terry Clark is a 48 y.o. African-American male with history of alcohol dependence, alcohol withdrawal seizures presented with alcoholic hepatitis, hematemesis.  Patient is currently hemodynamically stable with no further episodes since admission.    Hematemesis: Differentials include Mallory-Weiss tear or peptic  ulcer disease or erosive esophagitis or alcohol induced gastritis. Maintain n.p.o. Continue Protonix drip Recommend EGD for further evaluation Patient will need alcohol rehabilitation  Elevated LFTs and right upper quadrant pain Secondary to acute alcoholic hepatitis No clinical or biochemical evidence of chronic liver disease/cirrhosis Strongly advised him for abstinence of alcohol  I have discussed alternative options, risks & benefits,  which include, but are not limited to, bleeding, infection, perforation,respiratory complication & drug reaction.  The patient agrees with this plan & written consent will be obtained.    Thank you for involving me in the care of this patient.      LOS: 1 day   Sherri Sear, MD  09/18/2018, 1:16 PM   Note: This dictation was prepared with Dragon dictation along with smaller phrase technology. Any transcriptional errors that result from this process are unintentional.

## 2018-09-18 NOTE — Progress Notes (Signed)
PT Cancellation Note  Consult received and chart reviewed.  Patient off unit for diagnostic procedure.  Will continue efforts next date as medically appropriate and available  Gianlucas Evenson H. Manson PasseyBrown, PT, DPT, NCS 09/18/18, 1:15 PM [phone]

## 2018-09-18 NOTE — Anesthesia Postprocedure Evaluation (Signed)
Anesthesia Post Note  Patient: Terry Clark  Procedure(s) Performed: ESOPHAGOGASTRODUODENOSCOPY (EGD) (N/A )  Patient location during evaluation: Endoscopy Anesthesia Type: General Level of consciousness: awake and alert and oriented Pain management: pain level controlled Vital Signs Assessment: post-procedure vital signs reviewed and stable Respiratory status: spontaneous breathing, nonlabored ventilation and respiratory function stable Cardiovascular status: blood pressure returned to baseline and stable Postop Assessment: no signs of nausea or vomiting Anesthetic complications: no     Last Vitals:  Vitals:   09/18/18 1344 09/18/18 1354  BP: (!) 153/95 (!) 171/97  Pulse: (!) 53 (!) 57  Resp: 11 10  Temp:    SpO2: 100% 100%    Last Pain:  Vitals:   09/18/18 1354  TempSrc:   PainSc: 0-No pain                 Dsean Vantol

## 2018-09-18 NOTE — Progress Notes (Signed)
Patient complains that he has urinary urgency with incontinence. Bed wet with urine. Upon standing, observed urine dribbling. Kotex pad placed in disposable underwear for patient, and pad will be changed when wet.

## 2018-09-18 NOTE — Discharge Summary (Signed)
[employer] -  at [facility]   PATIENT NAME: Terry Clark    MR#:  [id number]  DATE OF BIRTH:  1970-12-15  DATE OF ADMISSION:  09/17/2018 ADMITTING PHYSICIAN: Ihor AustinPavan Pyreddy, MD  DATE OF DISCHARGE: 09/18/2018  PRIMARY CARE PHYSICIAN: Patient, [name]    ADMISSION DIAGNOSIS:  Anger [R45.4] Alcoholic intoxication with complication (HCC) [F10.929] Subconjunctival hemorrhage of both eyes [H11.33] Hematemesis, presence of nausea not specified [K92.0]  DISCHARGE DIAGNOSIS:  Principal Problem:   Alcoholic intoxication with complication (HCC) Active Problems:   GI bleed   Hematemesis   SECONDARY DIAGNOSIS:   Past Medical History:  Diagnosis Date  . Alcohol abuse   . Alcohol withdrawal seizure [facility](HCC)     HOSPITAL COURSE:  48 year old male with history of EtOH and tobacco dependence [place] and [place] to the ER for further evaluation.   1.  Hematemesis: Patient has not had any episodes of vomiting blood since admission. EGD showed esophagitis. He is to be on PPI BID and follow up with GI. He should abstain from EtOh and nSAIDS.  Hemoglobin has gone from 15.3-12.3.  There was no indication for blood transfusion 2.  EtOH intoxication: He had an uneventful detox. 3.Tobacco dependence: Patient is encouraged to quit smoking    DISCHARGE CONDITIONS AND DIET:  Stable Regular diet  CONSULTS OBTAINED:  Treatment Team:  Midge MiniumWohl, Darren, MD  DRUG ALLERGIES:  No Known Allergies  DISCHARGE MEDICATIONS:   Allergies as of 09/18/2018   No Known Allergies     Medication List    TAKE these medications   pantoprazole 40 MG tablet Commonly known as:  PROTONIX Take 1 tablet (40 mg total) by mouth 2 (two) times daily.         Today   CHIEF COMPLAINT:  Got back from egd which shows esophagitis   VITAL SIGNS:  Blood pressure (!) 150/90, pulse 61, temperature 98.1 F (36.7 C), temperature source Oral, resp.  rate 18, height 5\' 6"  (1.676 m), weight 62.7 kg, SpO2 100 %.   REVIEW OF SYSTEMS:  ROS   PHYSICAL EXAMINATION:  GENERAL:  48 y.o.-year-old patient lying in the bed with no acute distress.  NECK:  Supple, no jugular venous distention. No thyroid enlargement, no tenderness.  LUNGS: Normal breath sounds bilaterally, no wheezing, rales,rhonchi  No use of accessory muscles of respiration.  CARDIOVASCULAR: S1, S2 normal. No murmurs, rubs, or gallops.  ABDOMEN: Soft, non-tender, non-distended. Bowel sounds present. No organomegaly or mass.  EXTREMITIES: No pedal edema, cyanosis, or clubbing.  PSYCHIATRIC: The patient is alert and oriented x 3.  SKIN: No obvious rash, lesion, or ulcer.   DATA REVIEW:   CBC Recent Labs  Lab 09/17/18 1757  09/18/18 1437  WBC 3.5*  --   --   HGB 14.4   < > 12.7*  HCT 42.9   < > 39.0  PLT 179  --   --    < > = values in this interval not displayed.    Chemistries  Recent Labs  Lab 09/17/18 1420 09/18/18 0244  NA 133* 139  K 3.6 3.4*  CL 102 106  CO2 20* 23  GLUCOSE 85 91  BUN 10 9  CREATININE 0.80 0.76  CALCIUM 9.1 8.2*  AST 145*  --   ALT 119*  --   ALKPHOS 91  --   BILITOT 1.0  --     Cardiac Enzymes No results for input(s): TROPONINI in  the last 168 hours.  Microbiology Results  @MICRORSLT48 @  RADIOLOGY:  Ct Head Wo Contrast  Result Date: 09/17/2018 CLINICAL DATA:  Altered mental status, ETOH EXAM: CT HEAD WITHOUT CONTRAST TECHNIQUE: Contiguous axial images were obtained from the base of the skull through the vertex without intravenous contrast. COMPARISON:  07/30/2018 FINDINGS: Brain: No evidence of acute infarction, hemorrhage, hydrocephalus, extra-axial collection or mass lesion/mass effect. Mild subcortical white matter and periventricular small vessel ischemic changes. Vascular: No hyperdense vessel or unexpected calcification. Skull: Normal. Negative for fracture or focal lesion. Sinuses/Orbits: The visualized paranasal sinuses  are essentially clear. The mastoid air cells are unopacified. Other: None. IMPRESSION: No evidence of acute intracranial abnormality. Mild small vessel ischemic changes. Electronically Signed   By: Charline BillsSriyesh  Krishnan M.D.   On: 09/17/2018 15:05      Allergies as of 09/18/2018   No Known Allergies     Medication List    TAKE these medications   pantoprazole 40 MG tablet Commonly known as:  PROTONIX Take 1 tablet (40 mg total) by mouth 2 (two) times daily.        Management plans discussed with the patient and he is in agreement. Stable for discharge   Patient should follow up with GI  CODE STATUS:     Code Status Orders  (From admission, onward)         Start     Ordered   09/17/18 2024  Full code  Continuous     09/17/18 2023        Code Status History    Date Active Date Inactive Code Status Order ID Comments User Context   06/13/2014 1643 06/14/2014 1839 Full Code [id number]  Levie HeritageStinson, Jacob J, DO Inpatient      TOTAL TIME TAKING CARE OF THIS PATIENT: 38 minutes.    Note: This dictation was prepared with Dragon dictation along with smaller phrase technology. Any transcriptional errors that result from this process are unintentional.  Idriss Quackenbush M.D on 09/18/2018 at 4:19 PM  Between 7am to 6pm - Pager - [phone] After 6pm go to [email or web address] - password [email or web address]  [facility] [facility]  Office  [phone]  CC: Primary care physician; Patient, [name]

## 2018-09-18 NOTE — Progress Notes (Signed)
BP elevated.  Dr Juliene Pina informed and order received to discontinue NS IVF

## 2018-09-18 NOTE — Anesthesia Post-op Follow-up Note (Signed)
Anesthesia QCDR form completed.        

## 2018-09-18 NOTE — Op Note (Signed)
[facility] Gastroenterology Patient Name: Terry Clark Procedure Date: 09/18/2018 12:39 PM MRN: [id number] Account #: [id number] Date of Birth: Aug 09, 1971 Admit Type: Outpatient Age: 48 Room: [facility] ENDO ROOM 4 Gender: Male Note Status: Finalized Procedure:            Upper GI endoscopy Indications:          Hematemesis Providers:            Lin Landsman MD, MD Medicines:            Monitored Anesthesia Care Complications:        No immediate complications. Estimated blood loss: None. Procedure:            Pre-Anesthesia Assessment:                       - Prior to the procedure, a History and Physical was                        performed, and patient medications and allergies were                        reviewed. The patient is competent. The risks and                        benefits of the procedure and the sedation options and                        risks were discussed with the patient. All questions                        were answered and informed consent was obtained.                        Patient identification and proposed procedure were                        verified by the physician, the nurse, the                        anesthesiologist, the anesthetist and the technician in                        the pre-procedure area in the procedure room in the                        endoscopy suite. Mental Status Examination: alert and                        oriented. Airway Examination: normal oropharyngeal                        airway and neck mobility. Respiratory Examination:                        clear to auscultation. CV Examination: normal.                        Prophylactic Antibiotics: The patient does not require  prophylactic antibiotics. Prior Anticoagulants: The                        patient has taken no previous anticoagulant or                        antiplatelet agents. ASA Grade Assessment: III - A            patient with severe systemic disease. After reviewing                        the risks and benefits, the patient was deemed in                        satisfactory condition to undergo the procedure. The                        anesthesia plan was to use monitored anesthesia care                        (MAC). Immediately prior to administration of                        medications, the patient was re-assessed for adequacy                        to receive sedatives. The heart rate, respiratory rate,                        oxygen saturations, blood pressure, adequacy of                        pulmonary ventilation, and response to care were                        monitored throughout the procedure. The physical status                        of the patient was re-assessed after the procedure.                       After obtaining informed consent, the endoscope was                        passed under direct vision. Throughout the procedure,                        the patient's blood pressure, pulse, and oxygen                        saturations were monitored continuously. The Endoscope                        was introduced through the mouth, and advanced to the                        second part of duodenum. The upper GI endoscopy was                        accomplished without difficulty. The patient tolerated  the procedure well. Findings:      The duodenal bulb and second portion of the duodenum were normal.      The entire examined stomach was normal.      LA Grade B (one or more mucosal breaks greater than 5 mm, not extending       between the tops of two mucosal folds) esophagitis with no bleeding was       found in the lower third of the esophagus.      A small hiatal hernia was present. Impression:           - Normal duodenal bulb and second portion of the                        duodenum.                       - Normal stomach.                       -  LA Grade B erosive esophagitis.                       - Small hiatal hernia.                       - No specimens collected. Recommendation:       - Return patient to hospital ward for possible                        discharge same day.                       - Resume regular diet today.                       - Continue present medications.                       - Use Protonix (pantoprazole) 40 mg PO BID long term                        due to ongoing ETOH use. Procedure Code(s):    --- Professional ---                       [id number], Esophagogastroduodenoscopy, flexible, transoral;                        diagnostic, including collection of specimen(s) by                        brushing or washing, when performed (separate procedure) Diagnosis Code(s):    --- Professional ---                       K20.8, Other esophagitis                       K44.9, Diaphragmatic hernia without obstruction or                        gangrene                       K92.0, Hematemesis CPT  copyright 2018 American Medical Association. All rights reserved. The codes documented in this report are preliminary and upon coder review may  be revised to meet current compliance requirements. Dr. Ulyess Mort Lin Landsman MD, MD 09/18/2018 1:35:04 PM This report has been signed electronically. Number of Addenda: 0 Note Initiated On: 09/18/2018 12:39 PM      [facility]

## 2018-09-18 NOTE — Progress Notes (Signed)
PT Cancellation Note  Patient Details Name: Terry Clark MRN: [id number] DOB: 1971/02/08   Cancelled Treatment:    Reason Eval/Treat Not Completed: Medical issues which prohibited therapy;Other (comment)(PT in room, BP in supine 165/115. Reassessed on second site, 164/111. RN notified of BP, and in room to assess patient. Per guidelines, pt is not medically appropriate for PT, PT to follow up as able.)   Olga Coaster PT, DPT 3:47 PM,09/18/18 [phone]

## 2018-09-18 NOTE — Transfer of Care (Signed)
Immediate Anesthesia Transfer of Care Note  Patient: Terry Clark  Procedure(s) Performed: ESOPHAGOGASTRODUODENOSCOPY (EGD) (N/A )  Patient Location: Endoscopy Unit  Anesthesia Type:General  Level of Consciousness: drowsy and patient cooperative  Airway & Oxygen Therapy: Patient Spontanous Breathing and Patient connected to nasal cannula oxygen  Post-op Assessment: Report given to RN and Post -op Vital signs reviewed and stable  Post vital signs: Reviewed and stable  Last Vitals:  Vitals Value Taken Time  BP 142/75 09/18/2018  1:35 PM  Temp 36.1 C 09/18/2018  1:34 PM  Pulse 49 09/18/2018  1:38 PM  Resp 10 09/18/2018  1:38 PM  SpO2 100 % 09/18/2018  1:38 PM  Vitals shown include unvalidated device data.  Last Pain:  Vitals:   09/18/18 1334  TempSrc: Tympanic  PainSc: 0-No pain         Complications: No apparent anesthesia complications

## 2018-09-18 NOTE — Progress Notes (Signed)
Discharge instructions reviewed with the patient and his family.  His family is taking him home.  CNA taking patient to waiting ride via wheelchair

## 2018-09-19 ENCOUNTER — Encounter: Payer: Self-pay | Admitting: Gastroenterology

## 2018-09-19 LAB — HIV ANTIBODY (ROUTINE TESTING W REFLEX): HIV Screen 4th Generation wRfx: NONREACTIVE

## 2018-09-21 ENCOUNTER — Other Ambulatory Visit: Payer: Self-pay

## 2018-09-21 ENCOUNTER — Encounter: Payer: Self-pay | Admitting: Emergency Medicine

## 2018-09-21 ENCOUNTER — Emergency Department: Payer: Self-pay

## 2018-09-21 ENCOUNTER — Emergency Department
Admission: EM | Admit: 2018-09-21 | Discharge: 2018-09-22 | Disposition: A | Discharge status: Home / Self Care | Payer: Self-pay | Attending: Emergency Medicine | Admitting: Emergency Medicine

## 2018-09-21 DIAGNOSIS — F10929 Alcohol use, unspecified with intoxication, unspecified: Secondary | ICD-10-CM | POA: Insufficient documentation

## 2018-09-21 DIAGNOSIS — F17200 Nicotine dependence, unspecified, uncomplicated: Secondary | ICD-10-CM | POA: Insufficient documentation

## 2018-09-21 LAB — CBC WITH DIFFERENTIAL/PLATELET
Abs Immature Granulocytes: 0.02 10*3/uL (ref 0.00–0.07)
BASOS PCT: 2 %
Basophils Absolute: 0.1 10*3/uL (ref 0.0–0.1)
Eosinophils Absolute: 0.1 10*3/uL (ref 0.0–0.5)
Eosinophils Relative: 2 %
HCT: 42 % (ref 39.0–52.0)
Hemoglobin: 13.8 g/dL (ref 13.0–17.0)
Immature Granulocytes: 1 %
Lymphocytes Relative: 39 %
Lymphs Abs: 1.4 10*3/uL (ref 0.7–4.0)
MCH: 32.3 pg (ref 26.0–34.0)
MCHC: 32.9 g/dL (ref 30.0–36.0)
MCV: 98.4 fL (ref 80.0–100.0)
Monocytes Absolute: 0.7 10*3/uL (ref 0.1–1.0)
Monocytes Relative: 21 %
NEUTROS ABS: 1.2 10*3/uL — AB (ref 1.7–7.7)
Neutrophils Relative %: 35 %
Platelets: 229 10*3/uL (ref 150–400)
RBC: 4.27 MIL/uL (ref 4.22–5.81)
RDW: 13.9 % (ref 11.5–15.5)
WBC: 3.4 10*3/uL — ABNORMAL LOW (ref 4.0–10.5)
nRBC: 0 % (ref 0.0–0.2)

## 2018-09-21 LAB — COMPREHENSIVE METABOLIC PANEL
ALT: 124 U/L — AB (ref 0–44)
AST: 151 U/L — ABNORMAL HIGH (ref 15–41)
Albumin: 4.2 g/dL (ref 3.5–5.0)
Alkaline Phosphatase: 90 U/L (ref 38–126)
Anion gap: 10 (ref 5–15)
BILIRUBIN TOTAL: 0.5 mg/dL (ref 0.3–1.2)
BUN: 12 mg/dL (ref 6–20)
CO2: 28 mmol/L (ref 22–32)
CREATININE: 0.92 mg/dL (ref 0.61–1.24)
Calcium: 8.6 mg/dL — ABNORMAL LOW (ref 8.9–10.3)
Chloride: 101 mmol/L (ref 98–111)
GFR calc Af Amer: >60 mL/min (ref >60)
GFR calc non Af Amer: >60 mL/min (ref >60)
Glucose, Bld: 136 mg/dL — ABNORMAL HIGH (ref 70–99)
Potassium: 4 mmol/L (ref 3.5–5.1)
Sodium: 139 mmol/L (ref 135–145)
TOTAL PROTEIN: 8.4 g/dL — AB (ref 6.5–8.1)

## 2018-09-21 LAB — TROPONIN I: Troponin I: <0.03 ng/mL (ref <0.03)

## 2018-09-21 LAB — ETHANOL: Alcohol, Ethyl (B): 482 mg/dL (ref <10)

## 2018-09-21 NOTE — Discharge Instructions (Addendum)
Please drink alcohol in moderation or do not drink at all.  Return to the emergency department if you develop severe pain, seizures, thoughts of hurting yourself or anyone else, hallucinations, or any other symptoms concerning to you.

## 2018-09-21 NOTE — ED Triage Notes (Signed)
Pt presents to ED via [facility] c/o alcohol intoxication. EMS report pt's fiancee came home and could not find pt. Pt was located walking down side of road. Hx heavy alcohol use. Pt unable to answer triage questions at this time d/t intoxication.

## 2018-09-21 NOTE — ED Provider Notes (Signed)
[facility] Emergency Department Provider Note       Time seen: ----------------------------------------- 9:06 PM on 09/21/2018 ----------------------------------------- Level V caveat: History/ROS limited by altered mental status  I have reviewed the triage vital signs and the nursing notes.  HISTORY   Chief Complaint No chief complaint on file.    HPI Terry Clark is a 48 y.o. male with a history of alcohol abuse, withdrawal seizures, GI bleeding who presents to the ED for possible alcohol intoxication.  Patient was found passed out in the ditch.  Family member went to the store and when she came back he was gone.  Patient has a history of alcohol abuse but has been reportedly clean recently.  Past Medical History:  Diagnosis Date  . Alcohol abuse   . Alcohol withdrawal seizure [facility](HCC)     Patient Active Problem List   Diagnosis Date Noted  . Hematemesis   . GI bleed 09/17/2018  . Alcoholic intoxication with complication (HCC)   . Anger   . Subconjunctival hemorrhage of both eyes   . Alcohol withdrawal seizure (HCC) 06/13/2014  . 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 ESOPHAGOGASTRODUODENOSCOPY N/A 09/18/2018   Procedure: ESOPHAGOGASTRODUODENOSCOPY (EGD);  Surgeon: Toney ReilVanga, Rohini Reddy, MD;  [facility] ENDOSCOPY;  Service: Gastroenterology;  Laterality: N/A;  . TONSILLECTOMY      Allergies Patient has no known allergies.  Social History Social History   Tobacco Use  . Smoking status: Current Every Day Smoker    Packs/day: 1.00  . Smokeless tobacco: Never Used  Substance Use Topics  . Alcohol use: Yes    Alcohol/week: 252.0 standard drinks    Types: 252 Cans of beer per week    Comment:  12 pack per day  . Drug use: Yes    Types: Marijuana, Cocaine   Review of Systems Unknown, patient heavily intoxicated  All systems negative/normal/unremarkable except as stated in the  HPI  ____________________________________________   PHYSICAL EXAM:  VITAL SIGNS: ED Triage Vitals  Enc Vitals Group     BP      Pulse      Resp      Temp      Temp src      SpO2      Weight      Height      Head Circumference      Peak Flow      Pain Score      Pain Loc      Pain Edu?      Excl. in GC?    Constitutional: Alert but disoriented, no distress Eyes: Subconjunctival hemorrhages noted ENT      Head: Normocephalic and atraumatic.      Nose: No congestion/rhinnorhea.      Mouth/Throat: Mucous membranes are moist.      Neck: No stridor. Cardiovascular: Normal rate, regular rhythm. No murmurs, rubs, or gallops. Respiratory: Normal respiratory effort without tachypnea nor retractions. Breath sounds are clear and equal bilaterally. No wheezes/rales/rhonchi. Gastrointestinal: Soft and nontender. Normal bowel sounds Musculoskeletal: Nontender with normal range of motion in extremities. No lower extremity tenderness nor edema. Neurologic: Patient is confused, no obvious gross neurologic deficits are noted Skin:  Skin is warm, dry and intact. No rash noted. Psychiatric: Mood and affect are normal.  ____________________________________________  ED COURSE:  As part of my medical decision making, I reviewed the following data within the electronic MEDICAL RECORD NUMBER History obtained from family  if available, nursing notes, old chart and ekg, as well as notes from prior ED visits. Patient presented for altered mental status and likely alcohol intoxication, we will assess with labs and imaging as indicated at this time. Clinical Course as of Sep 21 2226  Sat Sep 21, 2018  2222 Alcohol, Ethyl (B)(!!): 482 [JW]    Clinical Course User Index [JW] Emily Filbert, MD   Procedures ____________________________________________   LABS (pertinent positives/negatives)  Labs Reviewed  CBC WITH DIFFERENTIAL/PLATELET - Abnormal; Notable for the following components:       Result Value   WBC 3.4 (*)    Neutro Abs 1.2 (*)    All other components within normal limits  COMPREHENSIVE METABOLIC PANEL - Abnormal; Notable for the following components:   Glucose, Bld 136 (*)    Calcium 8.6 (*)    Total Protein 8.4 (*)    AST 151 (*)    ALT 124 (*)    All other components within normal limits  ETHANOL - Abnormal; Notable for the following components:   Alcohol, Ethyl (B) 482 (*)    All other components within normal limits  TROPONIN I  URINALYSIS, COMPLETE (UACMP) WITH MICROSCOPIC  URINE DRUG SCREEN, QUALITATIVE ([facility] ONLY)  CBG MONITORING, ED    RADIOLOGY  CT head IMPRESSION: No acute intracranial abnormalities.  ____________________________________________   DIFFERENTIAL DIAGNOSIS   Alcohol intoxication, dehydration, electrolyte abnormality, subdural hematoma, assault, substance abuse  FINAL ASSESSMENT AND PLAN  Alcohol intoxication   Plan: The patient had presented for alcohol intoxication. Patient's labs revealed significant alcohol intoxication with a level of 482.Marland Kitchen Patient's imaging did not reveal any acute process.  He will need to be observed for an extensive time.  To ensure he is safe for discharge.   Ulice Dash, MD    Note: This note was generated in part or whole with voice recognition software. Voice recognition is usually quite accurate but there are transcription errors that can and very often do occur. I apologize for any typographical errors that were not detected and corrected.     Emily Filbert, MD 09/21/18 2229

## 2018-09-22 ENCOUNTER — Other Ambulatory Visit: Payer: Self-pay

## 2018-09-22 ENCOUNTER — Emergency Department
Admission: EM | Admit: 2018-09-22 | Discharge: 2018-09-23 | Disposition: A | Discharge status: Home / Self Care | Payer: Self-pay | Attending: Emergency Medicine | Admitting: Emergency Medicine

## 2018-09-22 ENCOUNTER — Emergency Department: Payer: Self-pay

## 2018-09-22 DIAGNOSIS — Z79899 Other long term (current) drug therapy: Secondary | ICD-10-CM | POA: Insufficient documentation

## 2018-09-22 DIAGNOSIS — F17200 Nicotine dependence, unspecified, uncomplicated: Secondary | ICD-10-CM | POA: Insufficient documentation

## 2018-09-22 DIAGNOSIS — F10120 Alcohol abuse with intoxication, uncomplicated: Secondary | ICD-10-CM | POA: Insufficient documentation

## 2018-09-22 DIAGNOSIS — F10920 Alcohol use, unspecified with intoxication, uncomplicated: Secondary | ICD-10-CM

## 2018-09-22 LAB — CBC WITH DIFFERENTIAL/PLATELET
Abs Immature Granulocytes: 0.01 10*3/uL (ref 0.00–0.07)
Basophils Absolute: 0.1 10*3/uL (ref 0.0–0.1)
Basophils Relative: 1 %
Eosinophils Absolute: 0 10*3/uL (ref 0.0–0.5)
Eosinophils Relative: 1 %
HCT: 40.4 % (ref 39.0–52.0)
Hemoglobin: 13.4 g/dL (ref 13.0–17.0)
Immature Granulocytes: 0 %
Lymphocytes Relative: 32 %
Lymphs Abs: 1.6 10*3/uL (ref 0.7–4.0)
MCH: 32.6 pg (ref 26.0–34.0)
MCHC: 33.2 g/dL (ref 30.0–36.0)
MCV: 98.3 fL (ref 80.0–100.0)
Monocytes Absolute: 0.7 10*3/uL (ref 0.1–1.0)
Monocytes Relative: 15 %
NRBC: 0 % (ref 0.0–0.2)
Neutro Abs: 2.4 10*3/uL (ref 1.7–7.7)
Neutrophils Relative %: 51 %
Platelets: 255 10*3/uL (ref 150–400)
RBC: 4.11 MIL/uL — ABNORMAL LOW (ref 4.22–5.81)
RDW: 13.6 % (ref 11.5–15.5)
WBC: 4.8 10*3/uL (ref 4.0–10.5)

## 2018-09-22 LAB — URINE DRUG SCREEN, QUALITATIVE (ARMC ONLY)
Amphetamines, Ur Screen: NOT DETECTED
Barbiturates, Ur Screen: NOT DETECTED
Benzodiazepine, Ur Scrn: NOT DETECTED
Cannabinoid 50 Ng, Ur ~~LOC~~: NOT DETECTED
Cocaine Metabolite,Ur ~~LOC~~: NOT DETECTED
MDMA (Ecstasy)Ur Screen: NOT DETECTED
Methadone Scn, Ur: NOT DETECTED
Opiate, Ur Screen: NOT DETECTED
Phencyclidine (PCP) Ur S: NOT DETECTED
Tricyclic, Ur Screen: NOT DETECTED

## 2018-09-22 LAB — URINALYSIS, COMPLETE (UACMP) WITH MICROSCOPIC
Bilirubin Urine: NEGATIVE
Glucose, UA: NEGATIVE mg/dL
Hgb urine dipstick: NEGATIVE
Ketones, ur: NEGATIVE mg/dL
Leukocytes, UA: NEGATIVE
Nitrite: NEGATIVE
PH: 6 (ref 5.0–8.0)
Protein, ur: NEGATIVE mg/dL
SPECIFIC GRAVITY, URINE: 1.003 — AB (ref 1.005–1.030)
Squamous Epithelial / HPF: NONE SEEN (ref 0–5)

## 2018-09-22 LAB — COMPREHENSIVE METABOLIC PANEL
ALT: 118 U/L — ABNORMAL HIGH (ref 0–44)
AST: 138 U/L — ABNORMAL HIGH (ref 15–41)
Albumin: 4.2 g/dL (ref 3.5–5.0)
Alkaline Phosphatase: 85 U/L (ref 38–126)
Anion gap: 11 (ref 5–15)
BUN: 12 mg/dL (ref 6–20)
CO2: 25 mmol/L (ref 22–32)
Calcium: 8.6 mg/dL — ABNORMAL LOW (ref 8.9–10.3)
Chloride: 99 mmol/L (ref 98–111)
Creatinine, Ser: 0.79 mg/dL (ref 0.61–1.24)
GFR calc Af Amer: >60 mL/min (ref >60)
GFR calc non Af Amer: >60 mL/min (ref >60)
Glucose, Bld: 162 mg/dL — ABNORMAL HIGH (ref 70–99)
Potassium: 4 mmol/L (ref 3.5–5.1)
Sodium: 135 mmol/L (ref 135–145)
Total Bilirubin: 0.6 mg/dL (ref 0.3–1.2)
Total Protein: 8.3 g/dL — ABNORMAL HIGH (ref 6.5–8.1)

## 2018-09-22 LAB — LIPASE, BLOOD: LIPASE: 33 U/L (ref 11–51)

## 2018-09-22 LAB — SALICYLATE LEVEL: Salicylate Lvl: <7 mg/dL (ref 2.8–30.0)

## 2018-09-22 LAB — ACETAMINOPHEN LEVEL: Acetaminophen (Tylenol), Serum: <10 ug/mL — ABNORMAL LOW (ref 10–30)

## 2018-09-22 LAB — ETHANOL: Alcohol, Ethyl (B): 481 mg/dL (ref <10)

## 2018-09-22 LAB — TROPONIN I: Troponin I: <0.03 ng/mL (ref <0.03)

## 2018-09-22 MED ORDER — THIAMINE HCL 100 MG/ML IJ SOLN
Freq: Once | INTRAVENOUS | Status: AC
  Administered 2018-09-22: 20:00:00 via INTRAVENOUS
  Filled 2018-09-22: qty 1000

## 2018-09-22 NOTE — ED Notes (Signed)
Pt seen by this RN ambulating to the BR and back to bed with steady gait and in NAD.

## 2018-09-22 NOTE — ED Notes (Signed)
Pt sitting up in bed, NAD noted at this time. Pt given meal tray, water, and a phone per his request. Pt visualized in NAD.

## 2018-09-22 NOTE — ED Notes (Signed)
NAD noted at time of D/C. Pt denies questions or concerns. Pt ambulatory to the lobby at this time to wait for his cousin to come and pick him up. Pt ambulatory without difficulty at this time.

## 2018-09-22 NOTE — ED Triage Notes (Signed)
Pt arrives via ems from a local store, concern noted from store attendants of the patient state, pt was holding rt side, states some discomfort there, pt was discharged this am from ER after eval from previous evening for etoh intoxication and being found in a ditch. Pt is alert but appears under the influence at this time, pt has urine stains noted to his shirt, pocket knife submitted by pt and given to officer at front desk to lock up

## 2018-09-22 NOTE — ED Notes (Signed)
Full rainbow sent to lab.  

## 2018-09-22 NOTE — ED Notes (Signed)
Pt resting in hallway bed with eyes closed and resp even/unlabored;

## 2018-09-22 NOTE — ED Notes (Signed)
ED Provider at bedside. 
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
## 2018-09-22 NOTE — ED Notes (Signed)
Pt found covered in urine along with bed. Pt stated to this RN that he is fine like that. This RN provided pt with wipes and paper scrubs in order to clean up. Pt refuses to clean at this time and moved into hallway bed.

## 2018-09-23 LAB — THC,MS,WB/SP RFX
CANNABINOID CONFIRMATION: POSITIVE
Cannabidiol: NEGATIVE ng/mL
Cannabinol: NEGATIVE ng/mL
Carboxy-THC: 1.1 ng/mL
Hydroxy-THC: NEGATIVE ng/mL
TETRAHYDROCANNABINOL(THC): NEGATIVE ng/mL

## 2018-09-23 LAB — DRUG SCREEN 10 W/CONF, SERUM
Amphetamines, IA: NEGATIVE ng/mL
Barbiturates, IA: NEGATIVE ug/mL
Benzodiazepines, IA: NEGATIVE ng/mL
Cocaine & Metabolite, IA: NEGATIVE ng/mL
Methadone, IA: NEGATIVE ng/mL
Opiates, IA: NEGATIVE ng/mL
Oxycodones, IA: NEGATIVE ng/mL
Phencyclidine, IA: NEGATIVE ng/mL
Propoxyphene, IA: NEGATIVE ng/mL
THC(Marijuana) Metabolite, IA: POSITIVE ng/mL

## 2018-09-23 NOTE — ED Provider Notes (Signed)
-----------------------------------------   7:32 AM on 09/23/2018 -----------------------------------------  Patient no longer wishes to go to [facility].  Wishes to be discharged home.  Patient is here voluntarily, we will discharged home with [facility] follow-up as an outpatient if desired.    Minna Antis, MD 09/23/18 [phone]

## 2018-09-23 NOTE — ED Notes (Signed)
Eyes closed, resp even and unlabored 

## 2018-09-23 NOTE — ED Notes (Signed)
Pt awake and talking on phone with his girlfriend, who had called in to speak with him; pt says he doesn't want to go to [facility] and doesn't remember saying he wanted to,  "to be honest"; says he has to see his parole officer tomorrow; explained to pt that his PO could be contacted and informed of his whereabouts; pt continues to decline to be transported to [facility]: Joni Reining, TTS informed pt has declined treatment

## 2018-09-23 NOTE — ED Notes (Signed)
Pt continues to rest with eyes closed, resp even and unlabored 

## 2018-09-23 NOTE — ED Notes (Signed)
Clinical Information has been faxed to [facility] for review

## 2018-09-23 NOTE — ED Notes (Signed)
Pt stated he had a ride to come get him. When asked again pt stated he might need bus pass. Rn gave pt a bus pass and educated pt on the bus stop station. Pt is non intoxicated at this time.

## 2018-09-23 NOTE — ED Notes (Signed)
Pt ambulatory with steady gait to bathroom; back to bed;

## 2018-09-24 ENCOUNTER — Emergency Department
Admission: EM | Admit: 2018-09-24 | Discharge: 2018-09-24 | Disposition: A | Discharge status: Home / Self Care | Payer: Self-pay | Attending: Emergency Medicine | Admitting: Emergency Medicine

## 2018-09-24 ENCOUNTER — Encounter: Payer: Self-pay | Admitting: Emergency Medicine

## 2018-09-24 ENCOUNTER — Other Ambulatory Visit: Payer: Self-pay

## 2018-09-24 DIAGNOSIS — F1410 Cocaine abuse, uncomplicated: Secondary | ICD-10-CM | POA: Insufficient documentation

## 2018-09-24 DIAGNOSIS — F1210 Cannabis abuse, uncomplicated: Secondary | ICD-10-CM | POA: Insufficient documentation

## 2018-09-24 DIAGNOSIS — F17210 Nicotine dependence, cigarettes, uncomplicated: Secondary | ICD-10-CM | POA: Insufficient documentation

## 2018-09-24 DIAGNOSIS — F10920 Alcohol use, unspecified with intoxication, uncomplicated: Secondary | ICD-10-CM | POA: Insufficient documentation

## 2018-09-24 LAB — CBC WITH DIFFERENTIAL/PLATELET
Abs Immature Granulocytes: 0.01 10*3/uL (ref 0.00–0.07)
Basophils Absolute: 0.1 10*3/uL (ref 0.0–0.1)
Basophils Relative: 3 %
EOS ABS: 0 10*3/uL (ref 0.0–0.5)
Eosinophils Relative: 1 %
HEMATOCRIT: 40.2 % (ref 39.0–52.0)
Hemoglobin: 13.4 g/dL (ref 13.0–17.0)
Immature Granulocytes: 0 %
Lymphocytes Relative: 34 %
Lymphs Abs: 0.9 10*3/uL (ref 0.7–4.0)
MCH: 32.8 pg (ref 26.0–34.0)
MCHC: 33.3 g/dL (ref 30.0–36.0)
MCV: 98.5 fL (ref 80.0–100.0)
MONOS PCT: 28 %
Monocytes Absolute: 0.8 10*3/uL (ref 0.1–1.0)
Neutro Abs: 0.9 10*3/uL — ABNORMAL LOW (ref 1.7–7.7)
Neutrophils Relative %: 34 %
Platelets: 233 10*3/uL (ref 150–400)
RBC: 4.08 MIL/uL — ABNORMAL LOW (ref 4.22–5.81)
RDW: 13.5 % (ref 11.5–15.5)
Smear Review: NORMAL
WBC: 2.7 10*3/uL — ABNORMAL LOW (ref 4.0–10.5)
nRBC: 0 % (ref 0.0–0.2)

## 2018-09-24 LAB — COMPREHENSIVE METABOLIC PANEL
ALK PHOS: 82 U/L (ref 38–126)
ALT: 117 U/L — ABNORMAL HIGH (ref 0–44)
AST: 159 U/L — ABNORMAL HIGH (ref 15–41)
Albumin: 4.2 g/dL (ref 3.5–5.0)
Anion gap: 13 (ref 5–15)
BUN: 8 mg/dL (ref 6–20)
CO2: 24 mmol/L (ref 22–32)
Calcium: 8.6 mg/dL — ABNORMAL LOW (ref 8.9–10.3)
Chloride: 100 mmol/L (ref 98–111)
Creatinine, Ser: 0.75 mg/dL (ref 0.61–1.24)
GFR calc Af Amer: >60 mL/min (ref >60)
GFR calc non Af Amer: >60 mL/min (ref >60)
GLUCOSE: 167 mg/dL — AB (ref 70–99)
Potassium: 3.8 mmol/L (ref 3.5–5.1)
Sodium: 137 mmol/L (ref 135–145)
Total Bilirubin: 0.8 mg/dL (ref 0.3–1.2)
Total Protein: 8.4 g/dL — ABNORMAL HIGH (ref 6.5–8.1)

## 2018-09-24 LAB — ETHANOL: Alcohol, Ethyl (B): 560 mg/dL (ref <10)

## 2018-09-24 LAB — TROPONIN I: Troponin I: <0.03 ng/mL (ref <0.03)

## 2018-09-24 NOTE — ED Notes (Signed)
MD at bedside. Pt attempting to walk at bedside with unsteady gait and slightly slurred speech. Pt in no acute distress at this time. This RN will continue to monitor.

## 2018-09-24 NOTE — ED Provider Notes (Signed)
[facility] Emergency Department Provider Note       Time seen: ----------------------------------------- 12:24 PM on 09/24/2018 ----------------------------------------- Level V caveat: History/ROS limited by intoxication  I have reviewed the triage vital signs and the nursing notes.  HISTORY   Chief Complaint Alcohol Intoxication    HPI Terry Clark is a 48 y.o. male with a history of alcohol abuse, withdrawal seizures, hematemesis who presents to the ED for intoxication.  Patient was [place] by [employer] [place]  Patient has had frequent visits for same in the past month.  Past Medical History:  Diagnosis Date  . Alcohol abuse   . Alcohol withdrawal seizure [facility])     Patient Active Problem List   Diagnosis Date Noted  . Hematemesis   . GI bleed 09/17/2018  . Alcoholic intoxication with complication (HCC)   . Anger   . Subconjunctival hemorrhage of both eyes   . Alcohol withdrawal seizure (HCC) 06/13/2014  . 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 ESOPHAGOGASTRODUODENOSCOPY N/A 09/18/2018   Procedure: ESOPHAGOGASTRODUODENOSCOPY (EGD);  Surgeon: Toney Reil, MD;  Location: [facility] ENDOSCOPY;  Service: Gastroenterology;  Laterality: N/A;  . TONSILLECTOMY      Allergies Patient has no known allergies.  Social History Social History   Tobacco Use  . Smoking status: Current Every Day Smoker    Packs/day: 1.00  . Smokeless tobacco: Never Used  Substance Use Topics  . Alcohol use: Yes    Alcohol/week: 252.0 standard drinks    Types: 252 Cans of beer per week    Comment:  12 pack per day  . Drug use: Yes    Types: Marijuana, Cocaine    Review of Systems Unknown, patient is heavily intoxicated  All systems negative/normal/unremarkable except as stated in the HPI  ____________________________________________   PHYSICAL  EXAM:  VITAL SIGNS: ED Triage Vitals [09/24/18 1222]  Enc Vitals Group     BP      Pulse      Resp      Temp      Temp src      SpO2      Weight 146 lb 2.6 oz (66.3 kg)     Height 5\' 6"  (1.676 m)     Head Circumference      Peak Flow      Pain Score      Pain Loc      Pain Edu?      Excl. in GC?     Constitutional: Lethargic, responds to verbal or painful stimuli Eyes: Subconjunctival hemorrhage is noted. Normal extraocular movements. ENT      Head: Normocephalic and atraumatic.      Nose: No congestion/rhinnorhea.      Mouth/Throat: Mucous membranes are moist.      Neck: No stridor. Cardiovascular: Normal rate, regular rhythm. No murmurs, rubs, or gallops. Respiratory: Normal respiratory effort without tachypnea nor retractions. Breath sounds are clear and equal bilaterally. No wheezes/rales/rhonchi. Gastrointestinal: Soft and nontender. Normal bowel sounds Musculoskeletal: Nontender with normal range of motion in extremities. No lower extremity tenderness nor edema. Neurologic: Slurred speech, no gross focal neurologic deficits are appreciated.  Skin:  Skin is warm, dry and intact. No rash noted. ___________________________________________  ED COURSE:  As part of my medical decision making, I reviewed the following data within the electronic MEDICAL RECORD NUMBER History obtained from family if available, nursing notes, old chart and  ekg, as well as notes from prior ED visits. Patient presented for alcohol intoxication, we will assess with labs and imaging as indicated at this time.   Procedures ____________________________________________   LABS (pertinent positives/negatives)  Labs Reviewed  CBC WITH DIFFERENTIAL/PLATELET - Abnormal; Notable for the following components:      Result Value   WBC 2.7 (*)    RBC 4.08 (*)    All other components within normal limits  COMPREHENSIVE METABOLIC PANEL - Abnormal; Notable for the following components:   Glucose, Bld 167 (*)     Calcium 8.6 (*)    Total Protein 8.4 (*)    AST 159 (*)    ALT 117 (*)    All other components within normal limits  ETHANOL - Abnormal; Notable for the following components:   Alcohol, Ethyl (B) 560 (*)    All other components within normal limits  TROPONIN I   ____________________________________________   DIFFERENTIAL DIAGNOSIS   Alcohol intoxication, substance abuse  FINAL ASSESSMENT AND PLAN  Alcohol intoxication   Plan: The patient had presented for intoxication. Patient's labs do indicate a markedly elevated alcohol level.  No other acute process was identified.  He will be observed until sober.   Ulice Dash, MD    Note: This note was generated in part or whole with voice recognition software. Voice recognition is usually quite accurate but there are transcription errors that can and very often do occur. I apologize for any typographical errors that were not detected and corrected.     Emily Filbert, MD 09/24/18 1328

## 2018-09-24 NOTE — ED Notes (Signed)
Pt's mother contacted to inform of upcoming discharge. Pt's mother arranging for family member to come pick pt up and will contact this RN when she gets in touch of family member.

## 2018-09-24 NOTE — ED Notes (Signed)
Pt cousin, Barney Drain, at bedside with pt to take pt home. Dr. Langston Masker at bedside to discuss D/C information with family. Pt cousin and pt verbalizing understanding of D/C instructions with no further questions at this time. Pt cousin verbalizing that he will be transporting pt home at this time. E-signature not working at this time. Pt in NAD at time of D/C. Pt ambulatory to lobby with family.

## 2018-09-24 NOTE — ED Notes (Signed)
Soiled clothing changed. Pt was given Top, pants, and underwear. Pt gait unsteady and required moderate assistance.

## 2018-09-24 NOTE — ED Triage Notes (Signed)
Pt presents to ED via [facility] c/o alcohol intoxication. [place] [employer] [place]. Pt has frequent visits for same in the last month. Pt opens eyes and mumbles, unintelligible speech d/t slurring at this time.

## 2018-09-24 NOTE — ED Notes (Signed)
This RN assisted pt with ambulation in hallway. Pt with unsteady gait and requiring 1 assist. Pt stating to RN "I still feel pretty drunk". MD aware.
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
## 2018-09-24 NOTE — ED Notes (Signed)
This RN attempted to contact pt's mother Matthews Garnier, per pt request, at [phone] with no answer. This RN will attempt to call mother again to update on pt condition.

## 2018-09-24 NOTE — ED Notes (Signed)
This RN spoke with pt's mother, with pt's permission. Mother updated on pt's status. Mother requesting that pt be given referral for treatment when D/C. Pt's mother informed that the doctor cannot force the pt to go to treatment, but can give the pt a referral. Pt's mother requesting referrals be given to pt. MD notified.

## 2018-09-24 NOTE — ED Provider Notes (Signed)
Signout from Dr. Mayford Knife in this 48 year old male who presents intoxicated.  No other complaints.  Plan is to observe in the emergency department until sober and then discharged home.  Physical Exam  BP 113/78   Pulse 78   Temp 98 F (36.7 C) (Oral)   Resp 18   Ht 5\' 6"  (1.676 m)   Wt 66.3 kg   SpO2 95%   BMI 23.59 kg/m  ----------------------------------------- 8:21 PM on 09/24/2018 -----------------------------------------   Physical Exam Patient is awake and alert at this time.  Not clinically intoxicated. ED Course/Procedures     Procedures  MDM  Patient's cousin is here in the emergency department and says that he will be taking the patient to his home and says he will watch the patient.  Patient may still be just slightly intoxicated but does not vomiting.  No complaints at this time.  To return for any worsening or concerning symptoms.  Given information for follow-up for detox by TTS.       Myrna Blazer, MD 09/24/18 2021

## 2018-09-24 NOTE — ED Notes (Signed)
Patient cooperative when verbally stimulated and laying in bed sleeping at this time

## 2018-09-24 NOTE — ED Notes (Signed)
ED Provider at bedside. 

## 2018-10-31 ENCOUNTER — Encounter: Payer: Self-pay | Admitting: Emergency Medicine

## 2018-10-31 ENCOUNTER — Emergency Department
Admission: EM | Admit: 2018-10-31 | Discharge: 2018-10-31 | Disposition: A | Discharge status: Home / Self Care | Payer: Self-pay | Attending: Emergency Medicine | Admitting: Emergency Medicine

## 2018-10-31 DIAGNOSIS — R509 Fever, unspecified: Secondary | ICD-10-CM | POA: Insufficient documentation

## 2018-10-31 DIAGNOSIS — J111 Influenza due to unidentified influenza virus with other respiratory manifestations: Secondary | ICD-10-CM | POA: Insufficient documentation

## 2018-10-31 DIAGNOSIS — R0981 Nasal congestion: Secondary | ICD-10-CM | POA: Insufficient documentation

## 2018-10-31 DIAGNOSIS — M791 Myalgia, unspecified site: Secondary | ICD-10-CM | POA: Insufficient documentation

## 2018-10-31 DIAGNOSIS — R5383 Other fatigue: Secondary | ICD-10-CM | POA: Insufficient documentation

## 2018-10-31 DIAGNOSIS — F1410 Cocaine abuse, uncomplicated: Secondary | ICD-10-CM | POA: Insufficient documentation

## 2018-10-31 DIAGNOSIS — F1210 Cannabis abuse, uncomplicated: Secondary | ICD-10-CM | POA: Insufficient documentation

## 2018-10-31 DIAGNOSIS — F17210 Nicotine dependence, cigarettes, uncomplicated: Secondary | ICD-10-CM | POA: Insufficient documentation

## 2018-10-31 DIAGNOSIS — R6889 Other general symptoms and signs: Secondary | ICD-10-CM

## 2018-10-31 MED ORDER — PSEUDOEPH-BROMPHEN-DM 30-2-10 MG/5ML PO SYRP: 5.0000 mL | ORAL_SOLUTION | Freq: Four times a day (QID) | ORAL | 0 refills | Status: DC | PRN

## 2018-10-31 MED ORDER — BENZONATATE 100 MG PO CAPS
200.0000 mg | ORAL_CAPSULE | Freq: Once | ORAL | Status: AC
  Administered 2018-10-31: 200 mg via ORAL
  Filled 2018-10-31: qty 2

## 2018-10-31 MED ORDER — IBUPROFEN 600 MG PO TABS: 600.0000 mg | ORAL_TABLET | Freq: Three times a day (TID) | ORAL | 0 refills | Status: DC | PRN

## 2018-10-31 MED ORDER — KETOROLAC TROMETHAMINE 60 MG/2ML IM SOLN
60.0000 mg | Freq: Once | INTRAMUSCULAR | Status: AC
  Administered 2018-10-31: 60 mg via INTRAMUSCULAR
  Filled 2018-10-31: qty 2

## 2018-10-31 MED ORDER — OSELTAMIVIR PHOSPHATE 75 MG PO CAPS: 75.0000 mg | ORAL_CAPSULE | Freq: Two times a day (BID) | ORAL | 0 refills | Status: AC

## 2018-10-31 NOTE — ED Notes (Signed)
Pt reports flu like sx since yesterday.   Runny nose, cough, bodyaches.  Pt took alka seltzer plus without relief this morning.

## 2018-10-31 NOTE — ED Triage Notes (Signed)
Pt reports flu-like sx's since yesterday. States cough congestion, fever, bodyaches and not feeling well.

## 2018-10-31 NOTE — ED Provider Notes (Signed)
[facility] Emergency Department Provider Note   ____________________________________________   First MD Initiated Contact with Patient 10/31/18 1131     (approximate)  I have reviewed the triage vital signs and the nursing notes.   HISTORY  Chief Complaint Cough; Fever; Generalized Body Aches; and Nasal Congestion    HPI Terry Clark is a 48 y.o. male patient presents with cough, nasal chest congestion, fever, body aches, and fatigue.  Onset of complaint was yesterday.  Patient denies nausea, vomiting, diarrhea.  Patient not taken flu shot for this season.  Patient rates his pain as a 10/10.  Patient described pain as "achy".  No palliative measure for complaint.    Past Medical History:  Diagnosis Date  . Alcohol abuse   . Alcohol withdrawal seizure [facility])     Patient Active Problem List   Diagnosis Date Noted  . Hematemesis   . GI bleed 09/17/2018  . Alcoholic intoxication with complication (HCC)   . Anger   . Subconjunctival hemorrhage of both eyes   . Alcohol withdrawal seizure (HCC) 06/13/2014  . 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 ESOPHAGOGASTRODUODENOSCOPY N/A 09/18/2018   Procedure: ESOPHAGOGASTRODUODENOSCOPY (EGD);  Surgeon: Toney Reil, MD;  Location: [facility] ENDOSCOPY;  Service: Gastroenterology;  Laterality: N/A;  . TONSILLECTOMY      Prior to Admission medications   Medication Sig Start Date End Date Taking? Authorizing Provider  brompheniramine-pseudoephedrine-DM 30-2-10 MG/5ML syrup Take 5 mLs by mouth 4 (four) times daily as needed. 10/31/18   Joni Reining, PA-C  ibuprofen (ADVIL,MOTRIN) 600 MG tablet Take 1 tablet (600 mg total) by mouth every 8 (eight) hours as needed. 10/31/18   Joni Reining, PA-C  oseltamivir (TAMIFLU) 75 MG capsule Take 1 capsule (75 mg total) by mouth 2 (two) times daily for 5 days. 10/31/18 11/05/18  Joni Reining, PA-C     Allergies Patient has no known allergies.  Family History  Problem Relation Age of Onset  . Kidney failure Mother   . Cancer Other     Social History Social History   Tobacco Use  . Smoking status: Current Every Day Smoker    Packs/day: 1.00  . Smokeless tobacco: Never Used  Substance Use Topics  . Alcohol use: Yes    Alcohol/week: 252.0 standard drinks    Types: 252 Cans of beer per week    Comment:  12 pack per day  . Drug use: Yes    Types: Marijuana, Cocaine    Review of Systems Constitutional: Fever/chills and body aches.   Eyes: No visual changes. ENT: No sore throat. Cardiovascular: Denies chest pain. Respiratory: Denies shortness of breath.  Nonproductive cough. Gastrointestinal: No abdominal pain.  No nausea, no vomiting.  No diarrhea.  No constipation. Genitourinary: Negative for dysuria. Musculoskeletal: Negative for back pain. Skin: Negative for rash. Neurological: Negative for headaches, focal weakness or numbness.   ____________________________________________   PHYSICAL EXAM:  VITAL SIGNS: ED Triage Vitals [10/31/18 1117]  Enc Vitals Group     BP      Pulse      Resp      Temp      Temp src      SpO2      Weight 157 lb (71.2 kg)     Height 5\' 6"  (1.676 m)     Head Circumference      Peak Flow      Pain Score 10  Pain Loc      Pain Edu?      Excl. in GC?    Constitutional: Alert and oriented. Well appearing and in no acute distress. Nose: Edematous nasal turbinates clear rhinorrhea.  Mouth/Throat: Mucous membranes are moist.  Oropharynx non-erythematous.  Postnasal drainage. Neck: No stridor.  Hematological/Lymphatic/Immunilogical: No cervical lymphadenopathy. Cardiovascular: Normal rate, regular rhythm. Grossly normal heart sounds.  Good peripheral circulation. Respiratory: Normal respiratory effort.  No retractions. Lungs CTAB. Gastrointestinal: Soft and nontender. No distention. No abdominal bruits. No CVA  tenderness. Musculoskeletal: No lower extremity tenderness nor edema.  No joint effusions. Neurologic:  Normal speech and language. No gross focal neurologic deficits are appreciated. No gait instability. Skin:  Skin is warm, dry and intact. No rash noted. Psychiatric: Mood and affect are normal. Speech and behavior are normal.  ____________________________________________   LABS (all labs ordered are listed, but only abnormal results are displayed)  Labs Reviewed - No data to display ____________________________________________  EKG   ____________________________________________  RADIOLOGY  ED MD interpretation:    Official radiology report(s): No results found.  ____________________________________________   PROCEDURES  Procedure(s) performed: None  Procedures  Critical Care performed: No  ____________________________________________   INITIAL IMPRESSION / ASSESSMENT AND PLAN / ED COURSE  As part of my medical decision making, I reviewed the following data within the electronic MEDICAL RECORD NUMBER     Patient presents with cute onset of cough, fever, and body aches consistent with viral illness.  Patient given discharge care instruction advised take medication as directed.  Patient advised [facility]-up [facility] condition persist.      ____________________________________________   FINAL CLINICAL IMPRESSION(S) / ED DIAGNOSES  Final diagnoses:  Flu-like symptoms     ED Discharge Orders         Ordered    oseltamivir (TAMIFLU) 75 MG capsule  2 times daily     10/31/18 1136    brompheniramine-pseudoephedrine-DM 30-2-10 MG/5ML syrup  4 times daily PRN     10/31/18 1136    ibuprofen (ADVIL,MOTRIN) 600 MG tablet  Every 8 hours PRN     10/31/18 1136           Note:  This document was prepared using Dragon voice recognition software and may include unintentional dictation errors.    Joni Reining, PA-C 10/31/18 1142    Jeanmarie Plant,  MD 10/31/18 [phone]

## 2019-02-26 ENCOUNTER — Inpatient Hospital Stay
Admission: EM | Admit: 2019-02-26 | Discharge: 2019-03-02 | DRG: 897 | Disposition: A | Discharge status: Home / Self Care | Payer: Self-pay | Attending: Internal Medicine | Admitting: Internal Medicine

## 2019-02-26 ENCOUNTER — Encounter: Payer: Self-pay | Admitting: Emergency Medicine

## 2019-02-26 ENCOUNTER — Emergency Department: Payer: Self-pay

## 2019-02-26 ENCOUNTER — Other Ambulatory Visit: Payer: Self-pay

## 2019-02-26 DIAGNOSIS — Z791 Long term (current) use of non-steroidal anti-inflammatories (NSAID): Secondary | ICD-10-CM

## 2019-02-26 DIAGNOSIS — F17200 Nicotine dependence, unspecified, uncomplicated: Secondary | ICD-10-CM | POA: Diagnosis present

## 2019-02-26 DIAGNOSIS — Z841 Family history of disorders of kidney and ureter: Secondary | ICD-10-CM

## 2019-02-26 DIAGNOSIS — R1310 Dysphagia, unspecified: Secondary | ICD-10-CM | POA: Diagnosis present

## 2019-02-26 DIAGNOSIS — E872 Acidosis: Secondary | ICD-10-CM | POA: Diagnosis present

## 2019-02-26 DIAGNOSIS — R569 Unspecified convulsions: Secondary | ICD-10-CM | POA: Diagnosis present

## 2019-02-26 DIAGNOSIS — F10239 Alcohol dependence with withdrawal, unspecified: Principal | ICD-10-CM | POA: Diagnosis present

## 2019-02-26 DIAGNOSIS — F10939 Alcohol use, unspecified with withdrawal, unspecified: Secondary | ICD-10-CM | POA: Diagnosis present

## 2019-02-26 DIAGNOSIS — Z1159 Encounter for screening for other viral diseases: Secondary | ICD-10-CM

## 2019-02-26 DIAGNOSIS — Z79899 Other long term (current) drug therapy: Secondary | ICD-10-CM

## 2019-02-26 DIAGNOSIS — Z23 Encounter for immunization: Secondary | ICD-10-CM

## 2019-02-26 DIAGNOSIS — E876 Hypokalemia: Secondary | ICD-10-CM | POA: Diagnosis present

## 2019-02-26 LAB — URINALYSIS, COMPLETE (UACMP) WITH MICROSCOPIC
Bacteria, UA: NONE SEEN
Bilirubin Urine: NEGATIVE
Glucose, UA: NEGATIVE mg/dL
Ketones, ur: NEGATIVE mg/dL
Leukocytes,Ua: NEGATIVE
Nitrite: NEGATIVE
Protein, ur: 30 mg/dL — AB
Specific Gravity, Urine: 1.012 (ref 1.005–1.030)
pH: 6 (ref 5.0–8.0)

## 2019-02-26 LAB — CBC WITH DIFFERENTIAL/PLATELET
Abs Immature Granulocytes: 0.03 10*3/uL (ref 0.00–0.07)
Basophils Absolute: 0 10*3/uL (ref 0.0–0.1)
Basophils Relative: 0 %
Eosinophils Absolute: 0 10*3/uL (ref 0.0–0.5)
Eosinophils Relative: 0 %
HCT: 36.8 % — ABNORMAL LOW (ref 39.0–52.0)
Hemoglobin: 12.4 g/dL — ABNORMAL LOW (ref 13.0–17.0)
Immature Granulocytes: 1 %
Lymphocytes Relative: 5 %
Lymphs Abs: 0.3 10*3/uL — ABNORMAL LOW (ref 0.7–4.0)
MCH: 32 pg (ref 26.0–34.0)
MCHC: 33.7 g/dL (ref 30.0–36.0)
MCV: 94.8 fL (ref 80.0–100.0)
Monocytes Absolute: 0.8 10*3/uL (ref 0.1–1.0)
Monocytes Relative: 12 %
Neutro Abs: 5.2 10*3/uL (ref 1.7–7.7)
Neutrophils Relative %: 82 %
Platelets: 143 10*3/uL — ABNORMAL LOW (ref 150–400)
RBC: 3.88 MIL/uL — ABNORMAL LOW (ref 4.22–5.81)
RDW: 14.9 % (ref 11.5–15.5)
WBC: 6.4 10*3/uL (ref 4.0–10.5)
nRBC: 0 % (ref 0.0–0.2)

## 2019-02-26 LAB — URINE DRUG SCREEN, QUALITATIVE (ARMC ONLY)
Amphetamines, Ur Screen: NOT DETECTED
Barbiturates, Ur Screen: NOT DETECTED
Benzodiazepine, Ur Scrn: NOT DETECTED
Cannabinoid 50 Ng, Ur ~~LOC~~: NOT DETECTED
Cocaine Metabolite,Ur ~~LOC~~: NOT DETECTED
MDMA (Ecstasy)Ur Screen: NOT DETECTED
Methadone Scn, Ur: NOT DETECTED
Opiate, Ur Screen: NOT DETECTED
Phencyclidine (PCP) Ur S: NOT DETECTED
Tricyclic, Ur Screen: NOT DETECTED

## 2019-02-26 LAB — COMPREHENSIVE METABOLIC PANEL
ALT: 146 U/L — ABNORMAL HIGH (ref 0–44)
AST: 187 U/L — ABNORMAL HIGH (ref 15–41)
Albumin: 4.3 g/dL (ref 3.5–5.0)
Alkaline Phosphatase: 66 U/L (ref 38–126)
Anion gap: 22 — ABNORMAL HIGH (ref 5–15)
BUN: 7 mg/dL (ref 6–20)
CO2: 18 mmol/L — ABNORMAL LOW (ref 22–32)
Calcium: 9.1 mg/dL (ref 8.9–10.3)
Chloride: 96 mmol/L — ABNORMAL LOW (ref 98–111)
Creatinine, Ser: 0.86 mg/dL (ref 0.61–1.24)
GFR calc Af Amer: >60 mL/min (ref >60)
GFR calc non Af Amer: >60 mL/min (ref >60)
Glucose, Bld: 143 mg/dL — ABNORMAL HIGH (ref 70–99)
Potassium: 3.6 mmol/L (ref 3.5–5.1)
Sodium: 136 mmol/L (ref 135–145)
Total Bilirubin: 0.9 mg/dL (ref 0.3–1.2)
Total Protein: 7.5 g/dL (ref 6.5–8.1)

## 2019-02-26 LAB — BASIC METABOLIC PANEL
Anion gap: 14 (ref 5–15)
BUN: 7 mg/dL (ref 6–20)
CO2: 26 mmol/L (ref 22–32)
Calcium: 8.8 mg/dL — ABNORMAL LOW (ref 8.9–10.3)
Chloride: 97 mmol/L — ABNORMAL LOW (ref 98–111)
Creatinine, Ser: 0.8 mg/dL (ref 0.61–1.24)
GFR calc Af Amer: >60 mL/min (ref >60)
GFR calc non Af Amer: >60 mL/min (ref >60)
Glucose, Bld: 94 mg/dL (ref 70–99)
Potassium: 3.3 mmol/L — ABNORMAL LOW (ref 3.5–5.1)
Sodium: 137 mmol/L (ref 135–145)

## 2019-02-26 LAB — ETHANOL: Alcohol, Ethyl (B): <10 mg/dL (ref <10)

## 2019-02-26 LAB — LACTIC ACID, PLASMA
Lactic Acid, Venous: 8.2 mmol/L (ref 0.5–1.9)
Lactic Acid, Venous: 2.8 mmol/L (ref 0.5–1.9)

## 2019-02-26 MED ORDER — HYDROCODONE-ACETAMINOPHEN 5-325 MG PO TABS
1.0000 | ORAL_TABLET | ORAL | Status: DC | PRN
  Administered 2019-02-27: 21:00:00 1 via ORAL
  Administered 2019-02-28: 2 via ORAL
  Administered 2019-02-28: 1 via ORAL
  Filled 2019-02-26 (×2): qty 1
  Filled 2019-02-26: qty 2

## 2019-02-26 MED ORDER — NICOTINE 14 MG/24HR TD PT24
14.0000 mg | MEDICATED_PATCH | Freq: Every day | TRANSDERMAL | Status: DC
  Administered 2019-02-27 – 2019-03-02 (×4): 14 mg via TRANSDERMAL
  Filled 2019-02-26 (×4): qty 1

## 2019-02-26 MED ORDER — ONDANSETRON HCL 4 MG PO TABS: 4.0000 mg | ORAL_TABLET | Freq: Four times a day (QID) | ORAL | Status: DC | PRN

## 2019-02-26 MED ORDER — ONDANSETRON HCL 4 MG/2ML IJ SOLN: 4.0000 mg | Freq: Four times a day (QID) | INTRAMUSCULAR | Status: DC | PRN

## 2019-02-26 MED ORDER — PNEUMOCOCCAL VAC POLYVALENT 25 MCG/0.5ML IJ INJ
0.5000 mL | INJECTION | INTRAMUSCULAR | Status: AC
  Administered 2019-02-27: 11:00:00 0.5 mL via INTRAMUSCULAR
  Filled 2019-02-26: qty 0.5

## 2019-02-26 MED ORDER — THIAMINE HCL 100 MG/ML IJ SOLN
100.0000 mg | Freq: Every day | INTRAMUSCULAR | Status: DC
  Administered 2019-02-26: 100 mg via INTRAVENOUS
  Filled 2019-02-26: qty 2

## 2019-02-26 MED ORDER — SODIUM CHLORIDE 0.9 % IV BOLUS
1000.0000 mL | Freq: Once | INTRAVENOUS | Status: AC
  Administered 2019-02-26: 1000 mL via INTRAVENOUS

## 2019-02-26 MED ORDER — ACETAMINOPHEN 650 MG RE SUPP: 650.0000 mg | Freq: Four times a day (QID) | RECTAL | Status: DC | PRN

## 2019-02-26 MED ORDER — LIDOCAINE VISCOUS HCL 2 % MT SOLN
15.0000 mL | OROMUCOSAL | Status: DC | PRN
  Administered 2019-02-26: 16:00:00 15 mL via OROMUCOSAL
  Filled 2019-02-26 (×2): qty 15

## 2019-02-26 MED ORDER — VITAMIN B-1 100 MG PO TABS
100.0000 mg | ORAL_TABLET | Freq: Every day | ORAL | Status: DC
  Administered 2019-02-27 – 2019-03-02 (×4): 100 mg via ORAL
  Filled 2019-02-26 (×4): qty 1

## 2019-02-26 MED ORDER — FOLIC ACID 1 MG PO TABS
1.0000 mg | ORAL_TABLET | Freq: Every day | ORAL | Status: DC
  Administered 2019-02-27 – 2019-03-02 (×4): 1 mg via ORAL
  Filled 2019-02-26 (×4): qty 1

## 2019-02-26 MED ORDER — LORAZEPAM 1 MG PO TABS
1.0000 mg | ORAL_TABLET | Freq: Four times a day (QID) | ORAL | Status: DC | PRN
  Administered 2019-02-27 – 2019-02-28 (×3): 1 mg via ORAL
  Filled 2019-02-26 (×3): qty 1

## 2019-02-26 MED ORDER — ENOXAPARIN SODIUM 40 MG/0.4ML ~~LOC~~ SOLN
40.0000 mg | SUBCUTANEOUS | Status: DC
  Administered 2019-02-26 – 2019-03-01 (×4): 40 mg via SUBCUTANEOUS
  Filled 2019-02-26 (×4): qty 0.4

## 2019-02-26 MED ORDER — LORAZEPAM 2 MG PO TABS: 0.0000 mg | ORAL_TABLET | Freq: Two times a day (BID) | ORAL | Status: DC

## 2019-02-26 MED ORDER — LORAZEPAM 2 MG PO TABS: 0.0000 mg | ORAL_TABLET | Freq: Four times a day (QID) | ORAL | Status: AC

## 2019-02-26 MED ORDER — KETOROLAC TROMETHAMINE 15 MG/ML IJ SOLN
15.0000 mg | Freq: Once | INTRAMUSCULAR | Status: AC
  Administered 2019-02-26: 15 mg via INTRAVENOUS
  Filled 2019-02-26: qty 1

## 2019-02-26 MED ORDER — LORAZEPAM 2 MG/ML IJ SOLN
1.0000 mg | Freq: Four times a day (QID) | INTRAMUSCULAR | Status: DC | PRN
  Administered 2019-02-27: 1 mg via INTRAVENOUS
  Filled 2019-02-26: qty 1

## 2019-02-26 MED ORDER — SENNOSIDES-DOCUSATE SODIUM 8.6-50 MG PO TABS: 1.0000 | ORAL_TABLET | Freq: Every evening | ORAL | Status: DC | PRN

## 2019-02-26 MED ORDER — ADULT MULTIVITAMIN W/MINERALS CH
1.0000 | ORAL_TABLET | Freq: Every day | ORAL | Status: DC
  Administered 2019-02-27 – 2019-03-02 (×4): 1 via ORAL
  Filled 2019-02-26 (×4): qty 1

## 2019-02-26 MED ORDER — ALBUTEROL SULFATE (2.5 MG/3ML) 0.083% IN NEBU: 2.5000 mg | INHALATION_SOLUTION | RESPIRATORY_TRACT | Status: DC | PRN

## 2019-02-26 MED ORDER — ACETAMINOPHEN 325 MG PO TABS: 650.0000 mg | ORAL_TABLET | Freq: Four times a day (QID) | ORAL | Status: DC | PRN

## 2019-02-26 MED ORDER — LORAZEPAM 2 MG/ML IJ SOLN
0.0000 mg | Freq: Four times a day (QID) | INTRAMUSCULAR | Status: AC
  Administered 2019-02-26: 4 mg via INTRAVENOUS
  Administered 2019-02-26 – 2019-02-28 (×4): 2 mg via INTRAVENOUS
  Filled 2019-02-26: qty 2
  Filled 2019-02-26 (×4): qty 1

## 2019-02-26 MED ORDER — BISACODYL 5 MG PO TBEC: 5.0000 mg | DELAYED_RELEASE_TABLET | Freq: Every day | ORAL | Status: DC | PRN

## 2019-02-26 MED ORDER — LORAZEPAM 2 MG/ML IJ SOLN: 0.0000 mg | Freq: Two times a day (BID) | INTRAMUSCULAR | Status: DC

## 2019-02-26 MED ORDER — LORAZEPAM 2 MG/ML IJ SOLN
2.0000 mg | Freq: Once | INTRAMUSCULAR | Status: AC
  Administered 2019-02-26: 2 mg via INTRAVENOUS
  Filled 2019-02-26: qty 1

## 2019-02-26 MED ORDER — SODIUM CHLORIDE 0.9 % IV SOLN
INTRAVENOUS | Status: DC
  Administered 2019-02-26 – 2019-02-27 (×2): via INTRAVENOUS

## 2019-02-26 NOTE — H&P (Signed)
[facility] [place] [facility]   PATIENT NAME: Terry Clark    MR#:  [id number]  DATE OF BIRTH:  [birth year]  DATE OF ADMISSION:  02/26/2019  PRIMARY CARE PHYSICIAN: Patient, [name]   REQUESTING/REFERRING PHYSICIAN: Shaune PollackIsaacs, Cameron, MD  CHIEF COMPLAINT:   Chief Complaint  Patient presents with  . Seizures   Seizures. HISTORY OF PRESENT ILLNESS:  Terry FusiJohnathan Labo  is a 48 y.o. male with a known history of alcohol abuse and alcohol withdrawal seizure.  The patient was sent to ED due to seizure episode this morning.  He had 4 episodes of seizure per EMS.  Patient report that he is trying to detox from alcohol on his wound.  He stopped drinking alcohol 2 days ago but he said his last drink was around 12:30 PM today.  He complains of headache but denies any other symptoms.  He has tongue biting due to seizure.  He was given Ativan in the ED.  ED physician request admission. PAST MEDICAL HISTORY:   Past Medical History:  Diagnosis Date  . Alcohol abuse   . Alcohol withdrawal seizure (HCC)     PAST SURGICAL HISTORY:   Past Surgical History:  Procedure Laterality Date  . ESOPHAGOGASTRODUODENOSCOPY N/A 09/18/2018   Procedure: ESOPHAGOGASTRODUODENOSCOPY (EGD);  Surgeon: Toney ReilVanga, Rohini Reddy, MD;  Location[facility] ENDOSCOPY;  Service: Gastroenterology;  Laterality: N/A;  . TONSILLECTOMY      SOCIAL HISTORY:   Social History   Tobacco Use  . Smoking status: Current Every Day Smoker    Packs/day: 1.00  . Smokeless tobacco: Never Used  Substance Use Topics  . Alcohol use: Yes    Alcohol/week: 252.0 standard drinks    Types: 252 Cans of beer per week    Comment:  12 pack per day    FAMILY HISTORY:   Family History  Problem Relation Age of Onset  . Kidney failure Mother   . Cancer Other     DRUG ALLERGIES:  No Known Allergies  REVIEW OF SYSTEMS:   Review of Systems  Constitutional: Positive for malaise/fatigue. Negative for chills and  fever.  HENT: Negative for sore throat.   Eyes: Negative for blurred vision and double vision.  Respiratory: Negative for cough, hemoptysis, shortness of breath, wheezing and stridor.   Cardiovascular: Negative for chest pain, palpitations, orthopnea and leg swelling.  Gastrointestinal: Negative for abdominal pain, blood in stool, diarrhea, melena, nausea and vomiting.  Genitourinary: Negative for dysuria, flank pain and hematuria.  Musculoskeletal: Negative for back pain and joint pain.  Skin: Negative for rash.  Neurological: Positive for seizures, loss of consciousness and headaches. Negative for dizziness, sensory change, focal weakness and weakness.  Endo/Heme/Allergies: Negative for polydipsia.  Psychiatric/Behavioral: Negative for depression. The patient is not nervous/anxious.     MEDICATIONS AT HOME:   Prior to Admission medications   Not on File      VITAL SIGNS:  Blood pressure (!) 162/91, pulse 84, temperature 99.2 F (37.3 C), temperature source Oral, resp. rate 17, height 5\' 9"  (1.753 m), weight 74.8 kg, SpO2 99 %.  PHYSICAL EXAMINATION:  Physical Exam  GENERAL:  48 y.o.-year-old patient lying in the bed with no acute distress.  EYES: Pupils equal, round, reactive to light and accommodation. No scleral icterus. Extraocular muscles intact.  HEENT: Head atraumatic, normocephalic. Oropharynx and nasopharynx clear.  Tongue bite. NECK:  Supple, no jugular venous distention. No thyroid enlargement, no tenderness.  LUNGS: Normal breath sounds bilaterally, no wheezing, rales,rhonchi  or crepitation. No use of accessory muscles of respiration.  CARDIOVASCULAR: S1, S2 normal. No murmurs, rubs, or gallops.  ABDOMEN: Soft, nontender, nondistended. Bowel sounds present. No organomegaly or mass.  EXTREMITIES: No pedal edema, cyanosis, or clubbing.  NEUROLOGIC: Cranial nerves II through XII are intact. Muscle strength 5/5 in all extremities. Sensation intact. Gait not checked.   PSYCHIATRIC: The patient is alert and oriented x 3.  SKIN: No obvious rash, lesion, or ulcer.   LABORATORY PANEL:   CBC Recent Labs  Lab 02/26/19 1426  WBC 6.4  HGB 12.4*  HCT 36.8*  PLT 143*   ------------------------------------------------------------------------------------------------------------------  Chemistries  Recent Labs  Lab 02/26/19 1426  NA 136  K 3.6  CL 96*  CO2 18*  GLUCOSE 143*  BUN 7  CREATININE 0.86  CALCIUM 9.1  AST 187*  ALT 146*  ALKPHOS 66  BILITOT 0.9   ------------------------------------------------------------------------------------------------------------------  Cardiac Enzymes No results for input(s): TROPONINI in the last 168 hours. ------------------------------------------------------------------------------------------------------------------  RADIOLOGY:  Ct Head Wo Contrast  Result Date: 02/26/2019 CLINICAL DATA:  New nodule X seizure EXAM: CT HEAD WITHOUT CONTRAST TECHNIQUE: Contiguous axial images were obtained from the base of the skull through the vertex without intravenous contrast. Sagittal and coronal MPR images reconstructed from axial data set. COMPARISON:  09/21/2018 FINDINGS: Brain: Normal ventricular morphology. No midline shift or mass effect. Normal appearance of brain parenchyma. No intracranial hemorrhage, mass lesion, or evidence of acute infarction. No extra-axial fluid collections. Vascular: No hyperdense vessels Skull: Intact Sinuses/Orbits: Clear Other: N/A IMPRESSION: Normal exam. Electronically Signed   By: Lavonia Dana M.D.   On: 02/26/2019 15:27      IMPRESSION AND PLAN:   Seizure due to alcohol withdrawal. The patient will be admitted to medical floor. Start CIWA protocol, Ativan as needed.  Lactic acidosis, possible due to seizure activity.  Continue IV fluid support, improving. Abnormal liver function test, possible related to alcohol abuse. Thrombocytopenia.  Possible due to alcohol.  Tobacco  abuse.  Smoking cessation was counseled for 3 to 4 minutes, nicotine patch.  All the records are reviewed and case discussed with ED provider. Management plans discussed with the patient, family and they are in agreement.  CODE STATUS: Full code.  TOTAL TIME TAKING CARE OF THIS PATIENT: 42 minutes.    Demetrios Loll M.D on 02/26/2019 at 6:56 PM  Between 7am to 6pm - Pager - [phone]  After 6pm go to [email or web address] - Proofreader  [facility] [place] [facility]  Office  [phone]  CC: Primary care physician; Patient, [name]   Note: This dictation was prepared with Dragon dictation along with smaller phrase technology. Any transcriptional errors that result from this process are unin

## 2019-02-26 NOTE — ED Provider Notes (Signed)
[facility] Emergency Department Provider Note ____________________________________________   First MD Initiated Contact with Patient 02/26/19 1438     (approximate)  I have reviewed the triage vital signs and the nursing notes.   HISTORY  Chief Complaint Seizures    HPI Terry Clark is a 48 y.o. male with PMH as noted below who presents with seizure, acute onset this morning, and occurring 4 times per EMS.  The patient states that they occurred after he tried to detox from alcohol on his own.  He reports that his last drink was around 12:30 PM today.  He denies illicit drug use.  He reports a headache but denies other acute symptoms.  He states that he has had seizures in the past related to alcohol detox, but is not on seizure medication.  Past Medical History:  Diagnosis Date  . Alcohol abuse   . Alcohol withdrawal seizure [facility](HCC)     Patient Active Problem List   Diagnosis Date Noted  . Hematemesis   . GI bleed 09/17/2018  . Alcoholic intoxication with complication (HCC)   . Anger   . Subconjunctival hemorrhage of both eyes   . Alcohol withdrawal seizure (HCC) 06/13/2014  . 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 ESOPHAGOGASTRODUODENOSCOPY N/A 09/18/2018   Procedure: ESOPHAGOGASTRODUODENOSCOPY (EGD);  Surgeon: Toney ReilVanga, Rohini Reddy, MD;  Location: [facility] ENDOSCOPY;  Service: Gastroenterology;  Laterality: N/A;  . TONSILLECTOMY      Prior to Admission medications   Medication Sig Start Date End Date Taking? Authorizing Provider  brompheniramine-pseudoephedrine-DM 30-2-10 MG/5ML syrup Take 5 mLs by mouth 4 (four) times daily as needed. 10/31/18   Joni ReiningSmith, Ronald K, PA-C  ibuprofen (ADVIL,MOTRIN) 600 MG tablet Take 1 tablet (600 mg total) by mouth every 8 (eight) hours as needed. 10/31/18   Joni ReiningSmith, Ronald K, PA-C    Allergies Patient has no known allergies.  Family History  Problem Relation Age of  Onset  . Kidney failure Mother   . Cancer Other     Social History Social History   Tobacco Use  . Smoking status: Current Every Day Smoker    Packs/day: 1.00  . Smokeless tobacco: Never Used  Substance Use Topics  . Alcohol use: Yes    Alcohol/week: 252.0 standard drinks    Types: 252 Cans of beer per week    Comment:  12 pack per day  . Drug use: Yes    Types: Marijuana, Cocaine    Review of Systems  Constitutional: No fever. Eyes: No redness. ENT: No neck pain. Cardiovascular: Denies chest pain. Respiratory: Denies shortness of breath. Gastrointestinal: No vomiting. Genitourinary: Negative for flank pain.  Musculoskeletal: Negative for back pain. Skin: Negative for rash. Neurological: Positive for seizure.   ____________________________________________   PHYSICAL EXAM:  VITAL SIGNS: ED Triage Vitals  Enc Vitals Group     BP 02/26/19 1429 (!) 142/90     Pulse Rate 02/26/19 1429 96     Resp 02/26/19 1429 16     Temp 02/26/19 1429 99.2 F (37.3 C)     Temp Source 02/26/19 1429 Oral     SpO2 02/26/19 1429 94 %     Weight 02/26/19 1430 165 lb (74.8 kg)     Height 02/26/19 1430 5\' 9"  (1.753 m)     Head Circumference --      Peak Flow --      Pain Score 02/26/19 1429 6     Pain  Loc --      Pain Edu? --      Excl. in [place]? --     Constitutional: Alert and oriented.  Slightly anxious appearing but in no acute distress.. Eyes: Conjunctivae are normal.  Pupils dilated but reactive. Head: Atraumatic. Nose: No congestion/rhinnorhea. Mouth/Throat: Mucous membranes are dry.  Several small anterior/lateral tongue lacerations. Neck: Normal range of motion.  Cardiovascular: Tachycardic, regular rhythm. Grossly normal heart sounds.  Good peripheral circulation. Respiratory: Normal respiratory effort.  No retractions. Lungs CTAB. Gastrointestinal: No distention.  Musculoskeletal: No lower extremity edema.  Extremities warm and well perfused.  Neurologic:  Normal  speech and language. No gross focal neurologic deficits are appreciated.  Tongue fasciculation and tremor. Skin:  Skin is warm and dry. No rash noted. Psychiatric: Calm and cooperative.  ____________________________________________   LABS (all labs ordered are listed, but only abnormal results are displayed)  Labs Reviewed  COMPREHENSIVE METABOLIC PANEL - Abnormal; Notable for the following components:      Result Value   Chloride 96 (*)    CO2 18 (*)    Glucose, Bld 143 (*)    AST 187 (*)    ALT 146 (*)    Anion gap 22 (*)    All other components within normal limits  LACTIC ACID, PLASMA - Abnormal; Notable for the following components:   Lactic Acid, Venous 8.2 (*)    All other components within normal limits  CBC WITH DIFFERENTIAL/PLATELET - Abnormal; Notable for the following components:   RBC 3.88 (*)    Hemoglobin 12.4 (*)    HCT 36.8 (*)    Platelets 143 (*)    Lymphs Abs 0.3 (*)    All other components within normal limits  NOVEL CORONAVIRUS, NAA (HOSPITAL ORDER, SEND-OUT TO REF LAB)  ETHANOL  LACTIC ACID, PLASMA  URINALYSIS, COMPLETE (UACMP) WITH MICROSCOPIC  URINE DRUG SCREEN, QUALITATIVE ([facility] ONLY)   ____________________________________________  EKG  ED ECG REPORT I, Arta Silence, the attending physician, personally viewed and interpreted this ECG.  Date: 02/26/2019 EKG Time: 1422 Rate: 97 Rhythm: normal sinus rhythm QRS Axis: normal Intervals: normal ST/T Wave abnormalities: Slightly tall T waves laterally Narrative Interpretation: no evidence of acute ischemia  ____________________________________________  RADIOLOGY    ____________________________________________   PROCEDURES  Procedure(s) performed: No  Procedures  Critical Care performed: Yes  CRITICAL CARE Performed by: Arta Silence   Total critical care time: 20 minutes  Critical care time was exclusive of separately billable procedures and treating other  patients.  Critical care was necessary to treat or prevent imminent or life-threatening deterioration.  Critical care was time spent personally by me on the following activities: development of treatment plan with patient and/or surrogate as well as nursing, discussions with consultants, evaluation of patient's response to treatment, examination of patient, obtaining history from patient or surrogate, ordering and performing treatments and interventions, ordering and review of laboratory studies, ordering and review of radiographic studies, pulse oximetry and re-evaluation of patient's condition. ____________________________________________   INITIAL IMPRESSION / ASSESSMENT AND PLAN / ED COURSE  Pertinent labs & imaging results that were available during my care of the patient were reviewed by me and considered in my medical decision making (see chart for details).  48 year old male with history of alcohol abuse presents after multiple seizures this afternoon in the context of trying to stop drinking on his own.  Last drink was around 12:30 PM.  Patient reports a mild headache and has been his tongue but denies other  acute symptoms.  On exam he is anxious and somewhat weak appearing but in no distress.  He is tachycardic and hypertensive with otherwise normal vital signs.  He does have some tremor and tongue fasciculation.  Neurologic exam is nonfocal.  There is no evidence of head trauma and he apparently was on a couch when the seizures happened.  Overall presentation is consistent with alcohol withdrawal.  The patient states he has had seizures in the past in the context of withdrawal but is not on seizure medication.  We will obtain lab work-up, CT head, give fluids and Ativan, and reassess.  I anticipate that the patient will need medical admission for further monitoring and treatment for alcohol withdrawal.  ----------------------------------------- 3:22 PM on 02/26/2019  -----------------------------------------  Patient is pending CT head and some of the lab work-up.  I am signing him out to the oncoming physician Dr. Erma HeritageIsaacs and I informed to the hospitalist Dr. Imogene Burnhen about the patient.  __________________________  Terry Clark was evaluated in Emergency Department on 02/26/2019 for the symptoms described in the history of present illness. He was evaluated in the context of the global COVID-19 pandemic, which necessitated consideration that the patient might be at risk for infection with the SARS-CoV-2 virus that causes COVID-19. Institutional protocols and algorithms that pertain to the evaluation of patients at risk for COVID-19 are in a state of rapid change based on information released by regulatory bodies including the CDC and federal and state organizations. These policies and algorithms were followed during the patient's care in the ED.  ____________________________________________   FINAL CLINICAL IMPRESSION(S) / ED DIAGNOSES  Final diagnoses:  Seizure (HCC)  Alcohol withdrawal syndrome with complication (HCC)      NEW MEDICATIONS STARTED DURING THIS VISIT:  New Prescriptions   No medications on file     Note:  This document was prepared using Dragon voice recognition software and may include unintentional dictation errors.    Dionne BucySiadecki, Lindwood Mogel, MD 02/26/19 [phone]

## 2019-02-26 NOTE — ED Provider Notes (Signed)
Patient initially requesting to leave.  I had a long discussion with him regarding the risks of leaving at this time, including worsening seizures, delirium tremens, and death.  He is amenable to staying.  He does appear mildly tremulous on exam, but is mentating well.  He said no further seizures.  Analgesics given for his tongue contusion/lacerations, and will admit.  He is amenable to staying at this time.  He is been given IV fluids and started on CIWA protocol.   Duffy Bruce, MD 02/26/19 [phone]

## 2019-02-26 NOTE — ED Notes (Signed)
Date and time results received: 02/26/19 3:14 PM  (use smartphrase ".now" to insert current time)  Test: Lactic 8.2 Critical Value: 8.2  Name of Provider Notified: Siadecki  Orders Received? Or Actions Taken?: Orders Received - See Orders for details

## 2019-02-26 NOTE — ED Notes (Signed)
ED TO INPATIENT HANDOFF REPORT  ED Nurse Name and Phone #: [name], [phone]  S Name/Age/Gender Terry GarbeJohnathan T Clark 48 y.o. male Room/Bed: ED26A/ED26A  Code Status   Code Status: Prior  Home/SNF/Other Home Patient oriented to: self, place, time and situation Is this baseline? Yes   Triage Complete: Triage complete  Chief Complaint seizure  Triage Note PT arrives via ems from home after 4 witnessed seizures. Bleeding noted to pt's tongue and chin. Pt reports he was attempting to detox himself. Pt reports he drinks beer and his last drink was today at 1230. Pt arrives a &o x 4.    Allergies No Known Allergies  Level of Care/Admitting Diagnosis ED Disposition    ED Disposition Condition Comment   Admit  [facility]ospital [facility] [[id number]]  Level of Care: Med-Surg [16]  Covid Evaluation: Screening Protocol (No Symptoms)  Diagnosis: Alcohol withdrawal (HCC) [291.81.ICD-9-CM]  Admitting Physician: Shaune PollackCHEN, QING [[id number]]  Attending Physician: Shaune PollackCHEN, QING [[id number]]  Estimated length of stay: past midnight tomorrow  Certification:: I certify this patient will need inpatient services for at least 2 midnights  PT Class (Do Not Modify): Inpatient [101]  PT Acc Code (Do Not Modify): Private [1]       B Medical/Surgery History Past Medical History:  Diagnosis Date  . Alcohol abuse   . Alcohol withdrawal seizure [facility](HCC)    Past Surgical History:  Procedure Laterality Date  . ESOPHAGOGASTRODUODENOSCOPY N/A 09/18/2018   Procedure: ESOPHAGOGASTRODUODENOSCOPY (EGD);  Surgeon: Toney ReilVanga, Rohini Reddy, MD;  Location[facility] ENDOSCOPY;  Service: Gastroenterology;  Laterality: N/A;  . TONSILLECTOMY       A IV Location/Drains/Wounds Patient Lines/Drains/Airways Status   Active Line/Drains/Airways    Name:   Placement date:   Placement time:   Site:   Days:   Peripheral IV 02/26/19 Left Antecubital   02/26/19    1422    Antecubital   less than 1           Intake/Output Last 24 hours  Intake/Output Summary (Last 24 hours) at 02/26/2019 1950 Last data filed at 02/26/2019 1906 Gross per 24 hour  Intake 2000 ml  Output 1500 ml  Net 500 ml    Labs/Imaging Results for orders placed or performed during the hospital encounter of 02/26/19 (from the past 48 hour(s))  Ethanol     Status: None   Collection Time: 02/26/19  2:26 PM  Result Value Ref Range   Alcohol, Ethyl (B) <10 <10 mg/dL    Comment: (NOTE) Lowest detectable limit for serum alcohol is 10 mg/dL. For medical purposes only. Performed at [facility], [address] [place], [place] [address]   Comprehensive metabolic panel     Status: Abnormal   Collection Time: 02/26/19  2:26 PM  Result Value Ref Range   Sodium 136 135 - 145 mmol/L    Comment: LYTES REPEATED MJU   Potassium 3.6 3.5 - 5.1 mmol/L   Chloride 96 (L) 98 - 111 mmol/L   CO2 18 (L) 22 - 32 mmol/L   Glucose, Bld 143 (H) 70 - 99 mg/dL   BUN 7 6 - 20 mg/dL   Creatinine, Ser 2.950.86 0.61 - 1.24 mg/dL   Calcium 9.1 8.9 - 28.410.3 mg/dL   Total Protein 7.5 6.5 - 8.1 g/dL   Albumin 4.3 3.5 - 5.0 g/dL   AST 132187 (H) 15 - 41 U/L   ALT 146 (H) 0 - 44 U/L   Alkaline Phosphatase 66 38 - 126 U/L  Total Bilirubin 0.9 0.3 - 1.2 mg/dL   GFR calc non Af Amer >60 >60 mL/min   GFR calc Af Amer >60 >60 mL/min   Anion gap 22 (H) 5 - 15    Comment: Performed at [facility], [address] [place], [place] [address]  Lactic acid, plasma     Status: Abnormal   Collection Time: 02/26/19  2:26 PM  Result Value Ref Range   Lactic Acid, Venous 8.2 (HH) 0.5 - 1.9 mmol/L    Comment: CRITICAL RESULT CALLED TO, READ BACK BY AND VERIFIED WITH Sharon Rubis RN AT 1505 02/26/19 SG Performed at [facility], [address] [place], [place] [address]   CBC with Differential     Status: Abnormal   Collection Time: 02/26/19  2:26 PM  Result Value Ref Range   WBC 6.4 4.0 - 10.5 K/uL   RBC 3.88 (L) 4.22 - 5.81  MIL/uL   Hemoglobin 12.4 (L) 13.0 - 17.0 g/dL   HCT 36.8 (L) 39.0 - 52.0 %   MCV 94.8 80.0 - 100.0 fL   MCH 32.0 26.0 - 34.0 pg   MCHC 33.7 30.0 - 36.0 g/dL   RDW 14.9 11.5 - 15.5 %   Platelets 143 (L) 150 - 400 K/uL   nRBC 0.0 0.0 - 0.2 %   Neutrophils Relative % 82 %   Neutro Abs 5.2 1.7 - 7.7 K/uL   Lymphocytes Relative 5 %   Lymphs Abs 0.3 (L) 0.7 - 4.0 K/uL   Monocytes Relative 12 %   Monocytes Absolute 0.8 0.1 - 1.0 K/uL   Eosinophils Relative 0 %   Eosinophils Absolute 0.0 0.0 - 0.5 K/uL   Basophils Relative 0 %   Basophils Absolute 0.0 0.0 - 0.1 K/uL   WBC Morphology MORPHOLOGY UNREMARKABLE    RBC Morphology MORPHOLOGY UNREMARKABLE    Smear Review MORPHOLOGY UNREMARKABLE    Immature Granulocytes 1 %   Abs Immature Granulocytes 0.03 0.00 - 0.07 K/uL    Comment: Performed at [facility], [address] [place], [place] [address]  Urinalysis, Complete w Microscopic     Status: Abnormal   Collection Time: 02/26/19  3:57 PM  Result Value Ref Range   Color, Urine YELLOW (A) YELLOW   APPearance CLEAR (A) CLEAR   Specific Gravity, Urine 1.012 1.005 - 1.030   pH 6.0 5.0 - 8.0   Glucose, UA NEGATIVE NEGATIVE mg/dL   Hgb urine dipstick SMALL (A) NEGATIVE   Bilirubin Urine NEGATIVE NEGATIVE   Ketones, ur NEGATIVE NEGATIVE mg/dL   Protein, ur 30 (A) NEGATIVE mg/dL   Nitrite NEGATIVE NEGATIVE   Leukocytes,Ua NEGATIVE NEGATIVE   RBC / HPF 0-5 0 - 5 RBC/hpf   WBC, UA 0-5 0 - 5 WBC/hpf   Bacteria, UA NONE SEEN NONE SEEN   Squamous Epithelial / LPF 0-5 0 - 5   Mucus PRESENT     Comment: Performed at [facility], [address] [place], [place] [address]  Urine Drug Screen, Qualitative     Status: None   Collection Time: 02/26/19  3:57 PM  Result Value Ref Range   Tricyclic, Ur Screen NONE DETECTED NONE DETECTED   Amphetamines, Ur Screen NONE DETECTED NONE DETECTED   MDMA (Ecstasy)Ur Screen NONE DETECTED NONE DETECTED   Cocaine Metabolite,Ur [place] NONE  DETECTED NONE DETECTED   Opiate, Ur Screen NONE DETECTED NONE DETECTED   Phencyclidine (PCP) Ur S NONE DETECTED NONE DETECTED   Cannabinoid 50 Ng, Ur  [place] NONE DETECTED NONE DETECTED   Barbiturates, Ur Screen NONE DETECTED NONE DETECTED   Benzodiazepine, Ur Scrn NONE DETECTED NONE DETECTED   Methadone Scn, Ur NONE DETECTED NONE DETECTED    Comment: (NOTE) Tricyclics + metabolites, urine    Cutoff 1000 ng/mL Amphetamines + metabolites, urine  Cutoff 1000 ng/mL MDMA (Ecstasy), urine              Cutoff 500 ng/mL Cocaine Metabolite, urine          Cutoff 300 ng/mL Opiate + metabolites, urine        Cutoff 300 ng/mL Phencyclidine (PCP), urine         Cutoff 25 ng/mL Cannabinoid, urine                 Cutoff 50 ng/mL Barbiturates + metabolites, urine  Cutoff 200 ng/mL Benzodiazepine, urine              Cutoff 200 ng/mL Methadone, urine                   Cutoff 300 ng/mL The urine drug screen provides only a preliminary, unconfirmed analytical test result and should not be used for non-medical purposes. Clinical consideration and professional judgment should be applied to any positive drug screen result due to possible interfering substances. A more specific alternate chemical method must be used in order to obtain a confirmed analytical result. Gas chromatography / mass spectrometry (GC/MS) is the preferred confirmat ory method. Performed at [facility], [address] [place], [place] [address]   Lactic acid, plasma     Status: Abnormal   Collection Time: 02/26/19  4:19 PM  Result Value Ref Range   Lactic Acid, Venous 2.8 (HH) 0.5 - 1.9 mmol/L    Comment: CRITICAL RESULT CALLED TO, READ BACK BY AND VERIFIED WITH Chauncey Sciulli @1655  02/26/19 MJU Performed at [facility], [address] [place], [place] [address]    Ct Head Wo Contrast  Result Date: 02/26/2019 CLINICAL DATA:  New nodule X seizure EXAM: CT HEAD WITHOUT CONTRAST TECHNIQUE: Contiguous axial images  were obtained from the base of the skull through the vertex without intravenous contrast. Sagittal and coronal MPR images reconstructed from axial data set. COMPARISON:  09/21/2018 FINDINGS: Brain: Normal ventricular morphology. No midline shift or mass effect. Normal appearance of brain parenchyma. No intracranial hemorrhage, mass lesion, or evidence of acute infarction. No extra-axial fluid collections. Vascular: No hyperdense vessels Skull: Intact Sinuses/Orbits: Clear Other: N/A IMPRESSION: Normal exam. Electronically Signed   By: Ulyses SouthwardMark  Boles M.D.   On: 02/26/2019 15:27    Pending Labs Unresulted Labs (From admission, onward)    Start     Ordered   02/26/19 1458  Novel Coronavirus,NAA,(SEND-OUT TO REF LAB - TAT 24-48 hrs); Hosp Order  (Asymptomatic Patients Labs)  Once,   STAT    Question:  Rule Out  Answer:  Yes   02/26/19 1457   Signed and Held  Basic metabolic panel  Tomorrow morning,   R     Signed and Held   Signed and Held  CBC  Tomorrow morning,   R     Signed and Held   Signed and Held  Creatinine, serum  (enoxaparin (LOVENOX)    CrCl >/= 30 ml/min)  Weekly,   R    Comments: while on enoxaparin therapy    Signed and Held   Signed and Held  Basic metabolic panel  Now then every 6 hours,  R     Signed and Held          Vitals/Pain Today's Vitals   02/26/19 1815 02/26/19 1830 02/26/19 1845 02/26/19 1856  BP: (!) 162/91 (!) 162/98 (!) 159/98   Pulse: 84 96 88   Resp: 17 15 13    Temp:      TempSrc:      SpO2: 99% 99% 99%   Weight:      Height:      PainSc:    8     Isolation Precautions No active isolations  Medications Medications  LORazepam (ATIVAN) injection 0-4 mg (2 mg Intravenous Given 02/26/19 1626)    Or  LORazepam (ATIVAN) tablet 0-4 mg ( Oral See Alternative 02/26/19 1626)  LORazepam (ATIVAN) injection 0-4 mg (has no administration in time range)    Or  LORazepam (ATIVAN) tablet 0-4 mg (has no administration in time range)  thiamine (VITAMIN B-1)  tablet 100 mg ( Oral See Alternative 02/26/19 1611)    Or  thiamine (B-1) injection 100 mg (100 mg Intravenous Given 02/26/19 1611)  lidocaine (XYLOCAINE) 2 % viscous mouth solution 15 mL (15 mLs Mouth/Throat Given 02/26/19 1610)  LORazepam (ATIVAN) injection 2 mg (2 mg Intravenous Given 02/26/19 1427)  sodium chloride 0.9 % bolus 1,000 mL (0 mLs Intravenous Stopped 02/26/19 1513)  sodium chloride 0.9 % bolus 1,000 mL (0 mLs Intravenous Stopped 02/26/19 1615)  ketorolac (TORADOL) 15 MG/ML injection 15 mg (15 mg Intravenous Given 02/26/19 1904)    Mobility walks Low fall risk   Focused Assessments Neuro Assessment Handoff:  Swallow screen pass? Pt had seizure, no indication to perform SSS Cardiac Rhythm: Normal sinus rhythm   Last date known well: 02/26/19   Neuro Assessment:   Neuro Checks:      Last Documented NIHSS Modified Score:   Has TPA been given? No If patient is a Neuro Trauma and patient is going to OR before floor call report to 4N Charge nurse: [phone] or [phone]     R Recommendations: See Admitting Provider Note  Report given to:   Additional Notes: Pt gets up from bed w/o notifying RN to urinate. Pt has urinated on floor, bed, and self x 2 while attempting to get out of bed. Pt reported last drink of ETOH was noon today.

## 2019-02-26 NOTE — ED Triage Notes (Signed)
PT arrives via ems from home after 4 witnessed seizures. Bleeding noted to pt's tongue and chin. Pt reports he was attempting to detox himself. Pt reports he drinks beer and his last drink was today at 1230. Pt arrives a &o x 4.

## 2019-02-27 LAB — BASIC METABOLIC PANEL
Anion gap: 9 (ref 5–15)
Anion gap: 9 (ref 5–15)
BUN: 6 mg/dL (ref 6–20)
BUN: 6 mg/dL (ref 6–20)
CO2: 29 mmol/L (ref 22–32)
CO2: 27 mmol/L (ref 22–32)
Calcium: 9.1 mg/dL (ref 8.9–10.3)
Calcium: 8.8 mg/dL — ABNORMAL LOW (ref 8.9–10.3)
Chloride: 100 mmol/L (ref 98–111)
Chloride: 100 mmol/L (ref 98–111)
Creatinine, Ser: 0.86 mg/dL (ref 0.61–1.24)
Creatinine, Ser: 0.83 mg/dL (ref 0.61–1.24)
GFR calc Af Amer: >60 mL/min (ref >60)
GFR calc Af Amer: >60 mL/min (ref >60)
GFR calc non Af Amer: >60 mL/min (ref >60)
GFR calc non Af Amer: >60 mL/min (ref >60)
Glucose, Bld: 87 mg/dL (ref 70–99)
Glucose, Bld: 130 mg/dL — ABNORMAL HIGH (ref 70–99)
Potassium: 3 mmol/L — ABNORMAL LOW (ref 3.5–5.1)
Potassium: 2.8 mmol/L — ABNORMAL LOW (ref 3.5–5.1)
Sodium: 138 mmol/L (ref 135–145)
Sodium: 136 mmol/L (ref 135–145)

## 2019-02-27 LAB — CBC
HCT: 36.7 % — ABNORMAL LOW (ref 39.0–52.0)
Hemoglobin: 12.4 g/dL — ABNORMAL LOW (ref 13.0–17.0)
MCH: 32.4 pg (ref 26.0–34.0)
MCHC: 33.8 g/dL (ref 30.0–36.0)
MCV: 95.8 fL (ref 80.0–100.0)
Platelets: 122 10*3/uL — ABNORMAL LOW (ref 150–400)
RBC: 3.83 MIL/uL — ABNORMAL LOW (ref 4.22–5.81)
RDW: 14.6 % (ref 11.5–15.5)
WBC: 4.2 10*3/uL (ref 4.0–10.5)
nRBC: 0 % (ref 0.0–0.2)

## 2019-02-27 LAB — MAGNESIUM: Magnesium: 2.1 mg/dL (ref 1.7–2.4)

## 2019-02-27 MED ORDER — POTASSIUM CHLORIDE CRYS ER 20 MEQ PO TBCR
40.0000 meq | EXTENDED_RELEASE_TABLET | Freq: Once | ORAL | Status: AC
  Administered 2019-02-27: 11:00:00 40 meq via ORAL
  Filled 2019-02-27: qty 2

## 2019-02-27 MED ORDER — ENSURE ENLIVE PO LIQD
237.0000 mL | Freq: Two times a day (BID) | ORAL | Status: DC
  Administered 2019-02-27 – 2019-03-02 (×5): 237 mL via ORAL

## 2019-02-27 MED ORDER — KETOROLAC TROMETHAMINE 30 MG/ML IJ SOLN
15.0000 mg | Freq: Once | INTRAMUSCULAR | Status: AC
  Administered 2019-02-27: 05:00:00 15 mg via INTRAVENOUS
  Filled 2019-02-27: qty 1

## 2019-02-27 MED ORDER — LORAZEPAM 2 MG/ML IJ SOLN: 2.0000 mg | Freq: Once | INTRAMUSCULAR | Status: DC

## 2019-02-27 MED ORDER — POTASSIUM CHLORIDE IN NACL 40-0.9 MEQ/L-% IV SOLN
INTRAVENOUS | Status: DC
  Administered 2019-02-27 – 2019-03-02 (×8): 125 mL/h via INTRAVENOUS
  Filled 2019-02-27 (×13): qty 1000

## 2019-02-27 MED ORDER — METHYLPREDNISOLONE SODIUM SUCC 125 MG IJ SOLR
125.0000 mg | Freq: Once | INTRAMUSCULAR | Status: AC
  Administered 2019-02-27: 125 mg via INTRAVENOUS
  Filled 2019-02-27: qty 2

## 2019-02-27 NOTE — Progress Notes (Addendum)
[place] [facility] [place] NAME: Levoy Geisen    MR#:  [id number]  DATE OF BIRTH:  1970-11-06  SUBJECTIVE:  CHIEF COMPLAINT:   Chief Complaint  Patient presents with  . Seizures  Patient seen today No new episodes of seizures Has swollen tongue secondary to biting during the episode of seizure Has difficulty swallowing food No difficulty breathing  REVIEW OF SYSTEMS:    ROS  CONSTITUTIONAL: No documented fever. No fatigue, weakness. No weight gain, no weight loss.  EYES: No blurry or double vision.  ENT: No tinnitus. No postnasal drip. No redness of the oropharynx.  Swelling of tongue. RESPIRATORY: No cough, no wheeze, no hemoptysis. No dyspnea.  CARDIOVASCULAR: No chest pain. No orthopnea. No palpitations. No syncope.  GASTROINTESTINAL: No nausea, no vomiting or diarrhea. No abdominal pain. No melena or hematochezia.  GENITOURINARY: No dysuria or hematuria.  ENDOCRINE: No polyuria or nocturia. No heat or cold intolerance.  HEMATOLOGY: No anemia. No bruising. No bleeding.  INTEGUMENTARY: No rashes. No lesions.  MUSCULOSKELETAL: No arthritis. No swelling. No gout.  NEUROLOGIC: No numbness, tingling, or ataxia. No seizure-type activity today.  PSYCHIATRIC: No anxiety. No insomnia. No ADD.   DRUG ALLERGIES:  No Known Allergies  VITALS:  Blood pressure 140/66, pulse 63, temperature 99.1 F (37.3 C), temperature source Oral, resp. rate 16, height 5\' 6"  (1.676 m), weight 64.6 kg, SpO2 98 %.  PHYSICAL EXAMINATION:   Physical Exam  GENERAL:  48 y.o.-year-old patient lying in the bed with no acute distress.  EYES: Pupils equal, round, reactive to light and accommodation. No scleral icterus. Extraocular muscles intact.  HEENT: Head atraumatic, normocephalic. Oropharynx : swelling of tongue noted NECK:  Supple, no jugular venous distention. No thyroid enlargement, no tenderness.  LUNGS: Normal breath sounds bilaterally, no wheezing,  rales, rhonchi. No use of accessory muscles of respiration.  CARDIOVASCULAR: S1, S2 normal. No murmurs, rubs, or gallops.  ABDOMEN: Soft, nontender, nondistended. Bowel sounds present. No organomegaly or mass.  EXTREMITIES: No cyanosis, clubbing or edema b/l.    NEUROLOGIC: Cranial nerves II through XII are intact. No focal Motor or sensory deficits b/l.   PSYCHIATRIC: The patient is alert and oriented x 3.  SKIN: No obvious rash, lesion, or ulcer.     LABORATORY PANEL:   CBC Recent Labs  Lab 02/27/19 0316  WBC 4.2  HGB 12.4*  HCT 36.7*  PLT 122*   ------------------------------------------------------------------------------------------------------------------ Chemistries  Recent Labs  Lab 02/26/19 1426  02/27/19 1019  NA 136   < > 136  K 3.6   < > 2.8*  CL 96*   < > 100  CO2 18*   < > 27  GLUCOSE 143*   < > 130*  BUN 7   < > 6  CREATININE 0.86   < > 0.83  CALCIUM 9.1   < > 8.8*  AST 187*  --   --   ALT 146*  --   --   ALKPHOS 66  --   --   BILITOT 0.9  --   --    < > = values in this interval not displayed.   ------------------------------------------------------------------------------------------------------------------  Cardiac Enzymes No results for input(s): TROPONINI in the last 168 hours. ------------------------------------------------------------------------------------------------------------------  RADIOLOGY:  Ct Head Wo Contrast  Result Date: 02/26/2019 CLINICAL DATA:  New nodule X seizure EXAM: CT HEAD WITHOUT CONTRAST TECHNIQUE: Contiguous axial images were obtained from the base of the skull through the vertex without intravenous contrast.  Sagittal and coronal MPR images reconstructed from axial data set. COMPARISON:  09/21/2018 FINDINGS: Brain: Normal ventricular morphology. No midline shift or mass effect. Normal appearance of brain parenchyma. No intracranial hemorrhage, mass lesion, or evidence of acute infarction. No extra-axial fluid  collections. Vascular: No hyperdense vessels Skull: Intact Sinuses/Orbits: Clear Other: N/A IMPRESSION: Normal exam. Electronically Signed   By: Ulyses SouthwardMark  Boles M.D.   On: 02/26/2019 15:27     ASSESSMENT AND PLAN:  48 year old male patient with history of alcohol abuse currently under hospitalist service  -Alcohol withdrawal seizure PRN Ativan for seizures Clinical monitoring  -Alcohol withdrawal CIWA protocol  -Acute hypokalemia Replace potassium aggressively Check magnesium levels  -Swelling of the tongue Solu-Medrol IV 125 mg 1 dose Modified diet as per speech therapy  -Lactic acidosis secondary to seizure IV fluids  All the records are reviewed and case discussed with Care Management/Social Worker. Management plans discussed with the patient, family and they are in agreement.  CODE STATUS: Full code  DVT Prophylaxis: SCDs  TOTAL TIME TAKING CARE OF THIS PATIENT: 37 minutes.   POSSIBLE D/C IN 2 to 3 DAYS, DEPENDING ON CLINICAL CONDITION.  Ihor AustinPavan Pyreddy M.D on 02/27/2019 at 12:45 PM  Between 7am to 6pm - Pager - [phone]  After 6pm go to [email or web address] - password [email or web address]  [facility]  Office  [phone]  CC: Primary care physician; Patient, [name]  Note: This dictation was prepared with Dragon dictation along with smaller phrase technology. Any transcriptional errors that result from this process are unintentional.

## 2019-02-27 NOTE — Progress Notes (Signed)
Advanced care plan. Purpose of the Encounter: CODE STATUS Parties in Attendance:Patient Patient's Decision Capacity:Good Subjective/Patient's story: Terry Clark  is a 48 y.o. male with a known history of alcohol abuse and alcohol withdrawal seizure.  The patient was sent to ED due to seizure episode this morning.  He had 4 episodes of seizure per EMS.  Patient report that he is trying to detox from alcohol on his wound.  He stopped drinking alcohol 2 days ago but he said his last drink was around 12:30 PM today.  He complains of headache but denies any other symptoms.  He has tongue biting due to seizure.  He was given Ativan in the ED.  ED physician request admission. Objective/Medical story Patient needs monitoring for seizures and PRN Ativan.  Needs detox for alcohol withdrawal.  Needs aggressive potassium repletion and magnesium check. Goals of care determination:  Advance care directives goals of care and treatment plan discussed For now patient wants everything done which includes CPR, intubation ventilator if the need arises CODE STATUS: Full code Time spent discussing advanced care planning: 16 minutes

## 2019-02-27 NOTE — Progress Notes (Signed)
Initial Nutrition Assessment  RD working remotely.  DOCUMENTATION CODES:   Not applicable  INTERVENTION:  Provide Ensure Enlive po BID, each supplement provides 350 kcal and 20 grams of protein.  Continue MVI daily, thiamine 878 mg daily, folic acid 1 mg daily.  Monitor magnesium, potassium, and phosphorus daily for at least 3 days, MD to replete as needed, as pt is at risk for refeeding syndrome given significant weight loss and hx of EtOH abuse.  NUTRITION DIAGNOSIS:   Inadequate oral intake related to decreased appetite as evidenced by 9.3% weight loss over 4 months.  GOAL:   Patient will meet greater than or equal to 90% of their needs  MONITOR:   PO intake, Supplement acceptance, Diet advancement, Labs, Weight trends, I & O's  REASON FOR ASSESSMENT:   Malnutrition Screening Tool    ASSESSMENT:   48 year old male with PMHx of EtOH abuse admitted with seizure due to EtOH withdrawal, lactic acidosis, abnormal liver function test.   Attempted to call patient for nutrition/weight history but he was unable to answer the phone. SLP consult was placed last night. This morning he was downgraded to full thin liquids (though he can have mashed potatoes with extra gravy. No straws for now. No meal completion documented so unsure how patient has been eating. Per chart he was 71.2 kg on 10/31/2018. He is now 64.6 kg (142.4 lbs). He has lost 6.6 kg (9.3% body weight) over the past 4 months, which is significant for time frame. Patient is at risk for malnutrition but unable to confirm if he is malnourished without full history and NFPE.  Medications reviewed and include: folic acid 1 mg daily, Ativan for CIWA, MVI daily, nicotine patch, potassium chloride 40 mEq once today, thiamine 100 mg daily, NS at 125 mL/hr.  Labs reviewed: Potassium 3.  NUTRITION - FOCUSED PHYSICAL EXAM:  Unable to complete at this time.  Diet Order:   Diet Order            Diet full liquid Room service  appropriate? Yes with Assist; Fluid consistency: Thin  Diet effective now             EDUCATION NEEDS:   No education needs have been identified at this time  Skin:  Skin Assessment: Reviewed RN Assessment  Last BM:  02/27/2019 - medium type 5  Height:   Ht Readings from Last 1 Encounters:  02/26/19 5\' 6"  (1.676 m)   Weight:   Wt Readings from Last 1 Encounters:  02/26/19 64.6 kg   Ideal Body Weight:  64.5 kg  BMI:  Body mass index is 22.98 kg/m.  Estimated Nutritional Needs:   Kcal:  1800-2000  Protein:  90-100 grams  Fluid:  1.8-2 L/day  Willey Blade, MS, RD, LDN Office: [phone] Pager: [phone] After Hours/Weekend Pager: [phone]

## 2019-02-27 NOTE — Evaluation (Signed)
Clinical/Bedside Swallow Evaluation Patient Details  Name: Terry Clark MRN: [id number] Date of Birth: 11/02/1970  Today's Date: 02/27/2019 Time: SLP Start Time (ACUTE ONLY): 1020 SLP Stop Time (ACUTE ONLY): 1120 SLP Time Calculation (min) (ACUTE ONLY): 60 min  Past Medical History:  Past Medical History:  Diagnosis Date  . Alcohol abuse   . Alcohol withdrawal seizure [facility](HCC)    Past Surgical History:  Past Surgical History:  Procedure Laterality Date  . ESOPHAGOGASTRODUODENOSCOPY N/A 09/18/2018   Procedure: ESOPHAGOGASTRODUODENOSCOPY (EGD);  Surgeon: Toney ReilVanga, Rohini Reddy, MD;  Location: [facility] ENDOSCOPY;  Service: Gastroenterology;  Laterality: N/A;  . TONSILLECTOMY     HPI:  Pt is a 48 y.o. male with a known history of alcohol abuse and alcohol withdrawal w/ seizure.  The patient was sent to ED due to seizure episodes this morning.  He had 4 episodes of seizure per EMS.  Patient report that he is trying to detox from alcohol on his own.  He stopped drinking alcohol 2 days ago but he said his last drink was around 12:30 PM today.  He complains of headache but denies any other symptoms.  He bit his tongue during the seizures and now has lingual edema.  He was given Ativan in the ED.  He is alert and verbally responsive; follows commands.    Assessment / Plan / Recommendation Clinical Impression  Pt appears to present w/ Oral phase dysphagia impacted heavily by the degree of lingual edema s/p biting of tongue secondary to seizure activity. The oral phase deficits w/ bolus consistencies can increase risk for pharyngeal phase deficits incuding aspiration and Pulmonary decline. Pt exhibited slower, deliberate lingual movement during bolus management for congual control of cohesion and A-P transfer guidance; oral phase was slower but w/ time, he was able to swallow and clear adequately. Mild throat clearing noted x2 post trials of liquids. No other overt s/s of aspiration noted; no coughing or wet  vocal quality and no decline in respiratory status noted during/post trials. Pt fed self w/ setup. OM exam revealed no unilateral weakness but moderately reduced lingual movements and protrusion. He exhibited a sensitive gag reflex and increased saliva; no drooling or anterior loss. Pt described it felt "thicker" in his throat. MD is addressing lingual edema w/ steroid medication. Recommend a Full Liquid diet at this time for better oral phase management and safer oral intake; thin liquid consistency. Recommend aspiration precautions; pills in Puree - Crushed as needed currently. NSG to monitor pt's status during meals/po's for any increased s/s of aspiration and hold po's. ST services will f/u for diet consistency upgrade when appropriate.  SLP Visit Diagnosis: Dysphagia, oropharyngeal phase (R13.12)(lingual edema)    Aspiration Risk  Mild aspiration risk(reduced following precautions)    Diet Recommendation  Full Liquid diet of thin liquid consistency; aspiration precautions including small, single sips/bites. NSG monitoring pt's status w/ meals/po's. REFLUX precautions.  Medication Administration: Crushed with puree    Other  Recommendations Recommended Consults: (Dietician f/u) Oral Care Recommendations: Oral care BID;Patient independent with oral care;Staff/trained caregiver to provide oral care Other Recommendations: (n/a)   Follow up Recommendations None(TBD)      Frequency and Duration min 2x/week  1 week       Prognosis Prognosis for Safe Diet Advancement: Fair(-Good) Barriers to Reach Goals: Severity of deficits      Swallow Study   General Date of Onset: 02/26/19 HPI: Pt is a 48 y.o. male with a known history of alcohol abuse and alcohol withdrawal  w/ seizure.  The patient was sent to ED due to seizure episodes this morning.  He had 4 episodes of seizure per EMS.  Patient report that he is trying to detox from alcohol on his own.  He stopped drinking alcohol 2 days ago but he  said his last drink was around 12:30 PM today.  He complains of headache but denies any other symptoms.  He bit his tongue during the seizures and now has lingual edema.  He was given Ativan in the ED.  He is alert and verbally responsive; follows commands.  Type of Study: Bedside Swallow Evaluation Previous Swallow Assessment: none Diet Prior to this Study: Regular;Thin liquids Temperature Spikes Noted: No(wbc 4.2) Respiratory Status: Room air History of Recent Intubation: No Behavior/Cognition: Alert;Cooperative;Pleasant mood Oral Cavity Assessment: Edema;Erythema;Lesions;Excessive secretions Oral Care Completed by SLP: Recent completion by staff Oral Cavity - Dentition: Missing dentition Vision: Functional for self-feeding Self-Feeding Abilities: Able to feed self;Needs set up Patient Positioning: Upright in bed Baseline Vocal Quality: Low vocal intensity Volitional Cough: Strong Volitional Swallow: Able to elicit    Oral/Motor/Sensory Function Overall Oral Motor/Sensory Function: Mild impairment(-Moderate impairment) Facial ROM: Within Functional Limits Facial Symmetry: Within Functional Limits Facial Strength: Within Functional Limits Lingual ROM: (reduced) Lingual Symmetry: Within Functional Limits Lingual Strength: (reduced) Velum: (CNT) Mandible: Within Functional Limits   Ice Chips Ice chips: Impaired Presentation: Spoon(fed; 3 trials) Oral Phase Impairments: Reduced lingual movement/coordination(slow) Oral Phase Functional Implications: Prolonged oral transit Pharyngeal Phase Impairments: (none)   Thin Liquid Thin Liquid: Impaired Presentation: Cup;Self Fed;Straw(3-4 trials via each) Oral Phase Impairments: Reduced lingual movement/coordination(slow) Oral Phase Functional Implications: Prolonged oral transit Pharyngeal  Phase Impairments: Throat Clearing - Delayed(x2/8 trials) Other Comments: multiple sips during    Nectar Thick Nectar Thick Liquid: Not tested    Honey Thick Honey Thick Liquid: Not tested   Puree Puree: Impaired Presentation: Spoon;Self Fed(6+ trials) Oral Phase Impairments: Reduced lingual movement/coordination(slow) Oral Phase Functional Implications: Prolonged oral transit Pharyngeal Phase Impairments: (none)   Solid     Solid: Not tested Other Comments: d/t lingual edema       Orinda Kenner, MS, CCC-SLP Watson,Katherine 02/27/2019,3:33 PM

## 2019-02-27 NOTE — Progress Notes (Signed)
Pateint had no seizure activity through out the night but tounge has swollen significantly. Placed speech consult per Dr. Sidney Ace due to difficulty swallowing. Pt continues to have withdraw symptoms of tremors, headache, and anxiety/restlessness. VS stable, last CIWA was a 3, will continue to monitor.

## 2019-02-28 LAB — BASIC METABOLIC PANEL
Anion gap: 6 (ref 5–15)
BUN: 6 mg/dL (ref 6–20)
CO2: 29 mmol/L (ref 22–32)
Calcium: 8.7 mg/dL — ABNORMAL LOW (ref 8.9–10.3)
Chloride: 100 mmol/L (ref 98–111)
Creatinine, Ser: 0.61 mg/dL (ref 0.61–1.24)
GFR calc Af Amer: >60 mL/min (ref >60)
GFR calc non Af Amer: >60 mL/min (ref >60)
Glucose, Bld: 103 mg/dL — ABNORMAL HIGH (ref 70–99)
Potassium: 3.2 mmol/L — ABNORMAL LOW (ref 3.5–5.1)
Sodium: 135 mmol/L (ref 135–145)

## 2019-02-28 LAB — SARS CORONAVIRUS 2 BY RT PCR (HOSPITAL ORDER, PERFORMED IN ~~LOC~~ HOSPITAL LAB): SARS Coronavirus 2: NEGATIVE

## 2019-02-28 LAB — MAGNESIUM: Magnesium: 1.8 mg/dL (ref 1.7–2.4)

## 2019-02-28 LAB — NOVEL CORONAVIRUS, NAA (HOSP ORDER, SEND-OUT TO REF LAB; TAT 18-24 HRS): SARS-CoV-2, NAA: UNDETERMINED — AB

## 2019-02-28 MED ORDER — METHYLPREDNISOLONE SODIUM SUCC 125 MG IJ SOLR
60.0000 mg | Freq: Once | INTRAMUSCULAR | Status: AC
  Administered 2019-02-28: 60 mg via INTRAVENOUS
  Filled 2019-02-28: qty 2

## 2019-02-28 MED ORDER — MAGNESIUM OXIDE 400 (241.3 MG) MG PO TABS
400.0000 mg | ORAL_TABLET | Freq: Two times a day (BID) | ORAL | Status: DC
  Administered 2019-02-28 – 2019-03-02 (×5): 400 mg via ORAL
  Filled 2019-02-28 (×5): qty 1

## 2019-02-28 MED ORDER — POTASSIUM CHLORIDE CRYS ER 20 MEQ PO TBCR
40.0000 meq | EXTENDED_RELEASE_TABLET | Freq: Once | ORAL | Status: AC
  Administered 2019-02-28: 40 meq via ORAL
  Filled 2019-02-28: qty 2

## 2019-02-28 NOTE — Progress Notes (Addendum)
  Speech Language Pathology Treatment: Dysphagia  Patient Details Name: Terry Clark MRN: [id number] DOB: 01-Feb-1971 Today's Date: 02/28/2019 Time: 3151-7616 SLP Time Calculation (min) (ACUTE ONLY): 45 min  Assessment / Plan / Recommendation Clinical Impression  Pt seen today for ongoing assessment of swallowing and toleration of diet; trials to upgrade to a level 2 (MINCED foods) in his diet. Pt's lingual edema is "better" per pt; min reduced in appearance when assessed and movements have increased slightly. Speech adequate. Pt A/O x3 and able to feed self at meals.  Pt consumed trials of the Minced foods w/ gravies added then thin liquids via cup w/ no overt s/s of aspiration noted; oral phase c/b grossly adequate bolus management and appropriate oral clearing post trials. No significant delayed A-P transfer or oral holding noted. No anterior spillage noted. Pt took his time and utilized small bites in order for best oral control.  At end of session, discussed use of condiments and gravies/soups w/ foods; mincing foods/chopping foods well; monitoring food consistencies in his diet for easier mastication and bolus management while tongue is still swollen; aspiration precautions. Recommended pills swallowed w/ a puree for best management. Pt agreed. Handouts given on diet consistency. ST services can be available for any further education if needed while admitted. MD/NSG updated and agreed.     HPI HPI: Pt is a 48 y.o. male with a known history of alcohol abuse and alcohol withdrawal w/ seizure.  The patient was sent to ED due to seizure episodes this morning.  He had 4 episodes of seizure per EMS.  Patient report that he is trying to detox from alcohol on his own.  He stopped drinking alcohol 2 days ago but he said his last drink was around 12:30 PM today.  He complains of headache but denies any other symptoms.  He bit his tongue during the seizures and now has lingual edema.  He was given  Ativan in the ED.  He is alert and verbally responsive; follows commands.       SLP Plan  Continue with current plan of care       Recommendations  Diet recommendations: Dysphagia 2 (fine chop);Thin liquid Liquids provided via: Cup Medication Administration: Whole meds with puree(as needed) Supervision: Patient able to self feed Compensations: Minimize environmental distractions;Slow rate;Small sips/bites;Lingual sweep for clearance of pocketing;Multiple dry swallows after each bite/sip;Follow solids with liquid Postural Changes and/or Swallow Maneuvers: Seated upright 90 degrees;Upright 30-60 min after meal                General recommendations: (Dietician f/u if needed) Oral Care Recommendations: Oral care BID;Patient independent with oral care Follow up Recommendations: None SLP Visit Diagnosis: Dysphagia, oral phase (R13.11);Dysphagia, oropharyngeal phase (R13.12) Plan: Continue with current plan of care       [place], [place], CCC-SLP Terry Clark 02/28/2019, 5:13 PM

## 2019-02-28 NOTE — Progress Notes (Signed)
[facility] - [place] [facility]   PATIENT NAME: Terry Clark    MR#:  [id number]  DATE OF BIRTH:  Dec 17, 1970  SUBJECTIVE:  CHIEF COMPLAINT:   Chief Complaint  Patient presents with   Seizures  Patient seen today No new episodes of seizures Has swollen tongue secondary to biting during the episode of seizure Has difficulty swallowing No difficulty breathing  REVIEW OF SYSTEMS:    ROS  CONSTITUTIONAL: No documented fever. No fatigue, weakness. No weight gain, no weight loss.  EYES: No blurry or double vision.  ENT: No tinnitus. No postnasal drip. No redness of the oropharynx.  Swelling of tongue. RESPIRATORY: No cough, no wheeze, no hemoptysis. No dyspnea.  CARDIOVASCULAR: No chest pain. No orthopnea. No palpitations. No syncope.  GASTROINTESTINAL: No nausea, no vomiting or diarrhea. No abdominal pain. No melena or hematochezia.  GENITOURINARY: No dysuria or hematuria.  ENDOCRINE: No polyuria or nocturia. No heat or cold intolerance.  HEMATOLOGY: No anemia. No bruising. No bleeding.  INTEGUMENTARY: No rashes. No lesions.  MUSCULOSKELETAL: No arthritis. No swelling. No gout.  NEUROLOGIC: No numbness, tingling, or ataxia. No seizure-type activity today.  PSYCHIATRIC: No anxiety. No insomnia. No ADD.   DRUG ALLERGIES:  No Known Allergies  VITALS:  Blood pressure (!) 141/105, pulse 60, temperature 98.2 F (36.8 C), temperature source Oral, resp. rate 16, height 5\' 6"  (1.676 m), weight 64.6 kg, SpO2 97 %.  PHYSICAL EXAMINATION:   Physical Exam  GENERAL:  48 y.o.-year-old patient lying in the bed with no acute distress.  EYES: Pupils equal, round, reactive to light and accommodation. No scleral icterus. Extraocular muscles intact.  HEENT: Head atraumatic, normocephalic. Oropharynx : swelling of tongue noted NECK:  Supple, no jugular venous distention. No thyroid enlargement, no tenderness.  LUNGS: Normal breath sounds bilaterally, no wheezing,  rales, rhonchi. No use of accessory muscles of respiration.  CARDIOVASCULAR: S1, S2 normal. No murmurs, rubs, or gallops.  ABDOMEN: Soft, nontender, nondistended. Bowel sounds present. No organomegaly or mass.  EXTREMITIES: No cyanosis, clubbing or edema b/l.    NEUROLOGIC: Cranial nerves II through XII are intact. No focal Motor or sensory deficits b/l.   PSYCHIATRIC: The patient is alert and oriented x 3.  SKIN: No obvious rash, lesion, or ulcer.      LABORATORY PANEL:   CBC Recent Labs  Lab 02/27/19 0316  WBC 4.2  HGB 12.4*  HCT 36.7*  PLT 122*   ------------------------------------------------------------------------------------------------------------------ Chemistries  Recent Labs  Lab 02/26/19 1426  02/28/19 0422  NA 136   < > 135  K 3.6   < > 3.2*  CL 96*   < > 100  CO2 18*   < > 29  GLUCOSE 143*   < > 103*  BUN 7   < > 6  CREATININE 0.86   < > 0.61  CALCIUM 9.1   < > 8.7*  MG  --    < > 1.8  AST 187*  --   --   ALT 146*  --   --   ALKPHOS 66  --   --   BILITOT 0.9  --   --    < > = values in this interval not displayed.   ------------------------------------------------------------------------------------------------------------------  Cardiac Enzymes No results for input(s): TROPONINI in the last 168 hours. ------------------------------------------------------------------------------------------------------------------  RADIOLOGY:  Ct Head Wo Contrast  Result Date: 02/26/2019 CLINICAL DATA:  New nodule X seizure EXAM: CT HEAD WITHOUT CONTRAST TECHNIQUE: Contiguous axial images were obtained from  the base of the skull through the vertex without intravenous contrast. Sagittal and coronal MPR images reconstructed from axial data set. COMPARISON:  09/21/2018 FINDINGS: Brain: Normal ventricular morphology. No midline shift or mass effect. Normal appearance of brain parenchyma. No intracranial hemorrhage, mass lesion, or evidence of acute infarction. No  extra-axial fluid collections. Vascular: No hyperdense vessels Skull: Intact Sinuses/Orbits: Clear Other: N/A IMPRESSION: Normal exam. Electronically Signed   By: Lavonia Dana M.D.   On: 02/26/2019 15:27     ASSESSMENT AND PLAN:  48 year old male patient with history of alcohol abuse currently under hospitalist service  -Alcohol withdrawal seizure PRN Ativan for seizures Clinical monitoring  -Alcohol withdrawal CIWA protocol  -Acute hypokalemia Replace potassium aggressively Check magnesium levels  -Dysphagia secondary to Swelling of the tongue Solu-Medrol IV 125 mg 1 dose given Modified diet as per speech therapy Improving slowly Upgrade to level 2 dysphagia diet and observe  -Lactic acidosis secondary to seizure IV fluids  All the records are reviewed and case discussed with Care Management/Social Worker. Management plans discussed with the patient, family and they are in agreement.  CODE STATUS: Full code  DVT Prophylaxis: SCDs  TOTAL TIME TAKING CARE OF THIS PATIENT: 36 minutes.   POSSIBLE D/C IN 2 to 3 DAYS, DEPENDING ON CLINICAL CONDITION.  Saundra Shelling M.D on 02/28/2019 at 10:00 AM  Between 7am to 6pm - Pager - [phone]  After 6pm go to [email or web address] - password [email or web address] [facility]  Office  [phone]  CC: Primary care physician; Patient, [name]  Note: This dictation was prepared with Dragon dictation along with smaller phrase technology. Any transcriptional errors that result from this process are unintentional.

## 2019-03-01 LAB — BASIC METABOLIC PANEL
Anion gap: 7 (ref 5–15)
BUN: 12 mg/dL (ref 6–20)
CO2: 29 mmol/L (ref 22–32)
Calcium: 9.4 mg/dL (ref 8.9–10.3)
Chloride: 100 mmol/L (ref 98–111)
Creatinine, Ser: 0.66 mg/dL (ref 0.61–1.24)
GFR calc Af Amer: >60 mL/min (ref >60)
GFR calc non Af Amer: >60 mL/min (ref >60)
Glucose, Bld: 105 mg/dL — ABNORMAL HIGH (ref 70–99)
Potassium: 4.6 mmol/L (ref 3.5–5.1)
Sodium: 136 mmol/L (ref 135–145)

## 2019-03-01 MED ORDER — SODIUM CHLORIDE 0.9% FLUSH: 3.0000 mL | INTRAVENOUS | Status: DC | PRN

## 2019-03-01 MED ORDER — SODIUM CHLORIDE 0.9% FLUSH
3.0000 mL | Freq: Two times a day (BID) | INTRAVENOUS | Status: DC
  Administered 2019-03-01: 14:00:00 3 mL via INTRAVENOUS

## 2019-03-01 NOTE — Progress Notes (Signed)
Speech Therapy note: reviewed chart notes; consulted pt then NSG re: pt's status. He is tolerating the current oral diet well he said w/ no overt s/s of aspiration noted. Reminded him of the need to cut foods small and moisten well during time of lingual edema. Pt agreed.  Recommend minced foods and general aspiration precautions.     Orinda Kenner, [place], CCC-SLP

## 2019-03-01 NOTE — Progress Notes (Addendum)
Tolerating diet well. Has visible tremors if holds hands out with no other CIWA signs/symptoms; slept at intervals. IVF's continued.Up in room and tolerated well. Denies co's. Tongue injury healing-eating soft foods without difficulty. Dr. Anselm Jungling notified of elevated blood pressures with no new orders.

## 2019-03-01 NOTE — Progress Notes (Signed)
[place] at [place] NAME: Terry Clark    MR#:  [id number]  DATE OF BIRTH:  [birth year]  SUBJECTIVE:  CHIEF COMPLAINT:   Chief Complaint  Patient presents with  . Seizures  Patient seen today No new episodes of seizures Has swollen tongue secondary to biting during the episode of seizure Has difficulty swallowing-improving gradually. No difficulty breathing  REVIEW OF SYSTEMS:    ROS  CONSTITUTIONAL: No documented fever. No fatigue, weakness. No weight gain, no weight loss.  EYES: No blurry or double vision.  ENT: No tinnitus. No postnasal drip. No redness of the oropharynx.  Swelling of tongue. RESPIRATORY: No cough, no wheeze, no hemoptysis. No dyspnea.  CARDIOVASCULAR: No chest pain. No orthopnea. No palpitations. No syncope.  GASTROINTESTINAL: No nausea, no vomiting or diarrhea. No abdominal pain. No melena or hematochezia.  GENITOURINARY: No dysuria or hematuria.  ENDOCRINE: No polyuria or nocturia. No heat or cold intolerance.  HEMATOLOGY: No anemia. No bruising. No bleeding.  INTEGUMENTARY: No rashes. No lesions.  MUSCULOSKELETAL: No arthritis. No swelling. No gout.  NEUROLOGIC: No numbness, tingling, or ataxia. No seizure-type activity today.  PSYCHIATRIC: No anxiety. No insomnia. No ADD.   DRUG ALLERGIES:  No Known Allergies  VITALS:  Blood pressure (!) 149/106, pulse 77, temperature 98.7 F (37.1 C), temperature source Oral, resp. rate 20, height 5\' 6"  (1.676 m), weight 64.6 kg, SpO2 95 %.  PHYSICAL EXAMINATION:   Physical Exam  GENERAL:  48 y.o.-year-old patient lying in the bed with no acute distress.  EYES: Pupils equal, round, reactive to light and accommodation. No scleral icterus. Extraocular muscles intact.  HEENT: Head atraumatic, normocephalic. Oropharynx : swelling of tongue noted NECK:  Supple, no jugular venous distention. No thyroid enlargement, no tenderness.  LUNGS: Normal breath sounds  bilaterally, no wheezing, rales, rhonchi. No use of accessory muscles of respiration.  CARDIOVASCULAR: S1, S2 normal. No murmurs, rubs, or gallops.  ABDOMEN: Soft, nontender, nondistended. Bowel sounds present. No organomegaly or mass.  EXTREMITIES: No cyanosis, clubbing or edema b/l.    NEUROLOGIC: Cranial nerves II through XII are intact. No focal Motor or sensory deficits b/l.   PSYCHIATRIC: The patient is alert and oriented x 3.  SKIN: No obvious rash, lesion, or ulcer.      LABORATORY PANEL:   CBC Recent Labs  Lab 02/27/19 0316  WBC 4.2  HGB 12.4*  HCT 36.7*  PLT 122*   ------------------------------------------------------------------------------------------------------------------ Chemistries  Recent Labs  Lab 02/26/19 1426  02/28/19 0422 03/01/19 0427  NA 136   < > 135 136  K 3.6   < > 3.2* 4.6  CL 96*   < > 100 100  CO2 18*   < > 29 29  GLUCOSE 143*   < > 103* 105*  BUN 7   < > 6 12  CREATININE 0.86   < > 0.61 0.66  CALCIUM 9.1   < > 8.7* 9.4  MG  --    < > 1.8  --   AST 187*  --   --   --   ALT 146*  --   --   --   ALKPHOS 66  --   --   --   BILITOT 0.9  --   --   --    < > = values in this interval not displayed.   ------------------------------------------------------------------------------------------------------------------  Cardiac Enzymes No results for input(s): TROPONINI in the last 168 hours. ------------------------------------------------------------------------------------------------------------------  RADIOLOGY:  No  results found.   ASSESSMENT AND PLAN:  48 year old male patient with history of alcohol abuse currently under hospitalist service  -Alcohol withdrawal seizure PRN Ativan for seizures Clinical monitoring  -Alcohol withdrawal CIWA protocol  -Acute hypokalemia Replace potassium aggressively Check magnesium levels- normal.  -Dysphagia secondary to Swelling of the tongue Solu-Medrol IV 125 mg 1 dose given Modified  diet as per speech therapy Improving slowly Dietary technician saw the patient and suggested to have soft and chopped food.  -Lactic acidosis secondary to seizure IV fluids  All the records are reviewed and case discussed with Care Management/Social Worker. Management plans discussed with the patient, family and they are in agreement.  CODE STATUS: Full code  DVT Prophylaxis: SCDs  TOTAL TIME TAKING CARE OF THIS PATIENT: 36 minutes.   POSSIBLE D/C IN 2 to 3 DAYS, DEPENDING ON CLINICAL CONDITION.  Altamese DillingVaibhavkumar Tu Shimmel M.D on 03/01/2019 at 2:53 PM  Between 7am to 6pm - Pager - [phone]  After 6pm go to [email or web address] - password [email or web address]  [facility] [place] [facility]  Office  [phone]  CC: Primary care physician; Patient, [name]  Note: This dictation was prepared with Dragon dictation along with smaller phrase technology. Any transcriptional errors that result from this process are unintentional.

## 2019-03-02 MED ORDER — ADULT MULTIVITAMIN W/MINERALS CH: 1.0000 | ORAL_TABLET | Freq: Every day | ORAL | 0 refills | Status: DC

## 2019-03-02 MED ORDER — THIAMINE HCL 100 MG PO TABS: 100.0000 mg | ORAL_TABLET | Freq: Every day | ORAL | 0 refills | Status: DC

## 2019-03-02 MED ORDER — FOLIC ACID 1 MG PO TABS: 1.0000 mg | ORAL_TABLET | Freq: Every day | ORAL | 0 refills | Status: DC

## 2019-03-02 NOTE — Discharge Summary (Signed)
[facility] - [place] at [facility]   PATIENT NAME: Terry Clark    MR#:  [id number]  DATE OF BIRTH:  07/13/1971  DATE OF ADMISSION:  02/26/2019 ADMITTING PHYSICIAN: Shaune PollackQing Chen, MD  DATE OF DISCHARGE: 03/02/2019   PRIMARY CARE PHYSICIAN: Patient, [name]    ADMISSION DIAGNOSIS:  Seizure (HCC) [R56.9] Alcohol withdrawal syndrome with complication (HCC) [F10.239]  DISCHARGE DIAGNOSIS:  Active Problems:   Alcohol withdrawal (HCC)   SECONDARY DIAGNOSIS:   Past Medical History:  Diagnosis Date  . Alcohol abuse   . Alcohol withdrawal seizure [facility](HCC)     HOSPITAL COURSE:   48 year old male patient with history of alcohol abuse currently under hospitalist service  -Alcohol withdrawal seizure PRN Ativan for seizures Clinical monitoring  -Alcohol withdrawal CIWA protocol- not requiring any meds for > 24 hrs.  -Acute hypokalemia Replace potassium aggressively Check magnesium levels- normal.  -Dysphagia secondary to Swelling of the tongue Solu-Medrol IV 125 mg 1 dose given Modified diet as per speech therapy Improving slowly Dietary technician saw the patient and suggested to have soft and chopped food.  -Lactic acidosis secondary to seizure IV fluids   DISCHARGE CONDITIONS:   Stable.  CONSULTS OBTAINED:    DRUG ALLERGIES:  No Known Allergies  DISCHARGE MEDICATIONS:   Allergies as of 03/02/2019   No Known Allergies     Medication List    TAKE these medications   folic acid 1 MG tablet Commonly known as: FOLVITE Take 1 tablet (1 mg total) by mouth daily. Start taking on: March 03, 2019   multivitamin with minerals Tabs tablet Take 1 tablet by mouth daily. Start taking on: March 03, 2019   thiamine 100 MG tablet Take 1 tablet (100 mg total) by mouth daily. Start taking on: March 03, 2019        DISCHARGE INSTRUCTIONS:    Do not drink alcohol. Follow with PMD in 1-2 weeks.  If you experience worsening of  your admission symptoms, develop shortness of breath, life threatening emergency, suicidal or homicidal thoughts you must seek medical attention immediately by calling 911 or calling your MD immediately  if symptoms less severe.  You Must read complete instructions/literature along with all the possible adverse reactions/side effects for all the Medicines you take and that have been prescribed to you. Take any new Medicines after you have completely understood and accept all the possible adverse reactions/side effects.   Please note  You were cared for by a hospitalist during your hospital stay. If you have any questions about your discharge medications or the care you received while you were in the hospital after you are discharged, you can call the unit and asked to speak with the hospitalist on call if the hospitalist that took care of you is not available. Once you are discharged, your primary care physician will handle any further medical issues. Please note that NO REFILLS for any discharge medications will be authorized once you are discharged, as it is imperative that you return to your primary care physician (or establish a relationship with a primary care physician if you do not have one) for your aftercare needs so that they can reassess your need for medications and monitor your lab values.    Today   CHIEF COMPLAINT:   Chief Complaint  Patient presents with  . Seizures    HISTORY OF PRESENT ILLNESS:  Terry Clark  is a 48 y.o. male with a known history of alcohol abuse and alcohol  withdrawal seizure.  The patient was sent to ED due to seizure episode this morning.  He had 4 episodes of seizure per EMS.  Patient report that he is trying to detox from alcohol on his wound.  He stopped drinking alcohol 2 days ago but he said his last drink was around 12:30 PM today.  He complains of headache but denies any other symptoms.  He has tongue biting due to seizure.  He was given Ativan in  the ED.  ED physician request admission.   VITAL SIGNS:  Blood pressure (!) 138/107, pulse (!) 102, temperature 98.6 F (37 C), temperature source Oral, resp. rate 17, height 5\' 6"  (1.676 m), weight 64.6 kg, SpO2 98 %.  I/O:    Intake/Output Summary (Last 24 hours) at 03/02/2019 1217 Last data filed at 03/02/2019 1017 Gross per 24 hour  Intake 2655.44 ml  Output 900 ml  Net 1755.44 ml    PHYSICAL EXAMINATION:   GENERAL:  47 y.o.-year-old patient lying in the bed with no acute distress.  EYES: Pupils equal, round, reactive to light and accommodation. No scleral icterus. Extraocular muscles intact.  HEENT: Head atraumatic, normocephalic. Oropharynx : swelling of tongue noted NECK:  Supple, no jugular venous distention. No thyroid enlargement, no tenderness.  LUNGS: Normal breath sounds bilaterally, no wheezing, rales, rhonchi. No use of accessory muscles of respiration.  CARDIOVASCULAR: S1, S2 normal. No murmurs, rubs, or gallops.  ABDOMEN: Soft, nontender, nondistended. Bowel sounds present. No organomegaly or mass.  EXTREMITIES: No cyanosis, clubbing or edema b/l.    NEUROLOGIC: Cranial nerves II through XII are intact. No focal Motor or sensory deficits b/l.   PSYCHIATRIC: The patient is alert and oriented x 3.  SKIN: No obvious rash, lesion, or ulcer.  DATA REVIEW:   CBC Recent Labs  Lab 02/27/19 0316  WBC 4.2  HGB 12.4*  HCT 36.7*  PLT 122*    Chemistries  Recent Labs  Lab 02/26/19 1426  02/28/19 0422 03/01/19 0427  NA 136   < > 135 136  K 3.6   < > 3.2* 4.6  CL 96*   < > 100 100  CO2 18*   < > 29 29  GLUCOSE 143*   < > 103* 105*  BUN 7   < > 6 12  CREATININE 0.86   < > 0.61 0.66  CALCIUM 9.1   < > 8.7* 9.4  MG  --    < > 1.8  --   AST 187*  --   --   --   ALT 146*  --   --   --   ALKPHOS 66  --   --   --   BILITOT 0.9  --   --   --    < > = values in this interval not displayed.    Cardiac Enzymes No results for input(s): TROPONINI in the last 168  hours.  Microbiology Results  Results for orders placed or performed during the hospital encounter of 02/26/19  Novel Coronavirus,NAA,(SEND-OUT TO REF LAB - TAT 24-48 hrs); Hosp Order     Status: Abnormal   Collection Time: 02/26/19  4:18 PM   Specimen: Nasopharyngeal Swab; Respiratory  Result Value Ref Range Status   SARS-CoV-2, NAA QUANTITY NOT SUFFICIENT, UNABLE TO PERFORM TEST (A) NOT DETECTED Final    Comment: (NOTE) Quantity was not sufficient for analysis.      Elsie Stain F notified 02/28/2019 Performed At: [facility] [place], [place]  [id number] Jolene SchimkeNagendra Sanjai MD NW:[id number]:[phone]    Coronavirus Source NASOPHARYNGEAL  Final    Comment: Performed at [facility], [address] [place], [place] [address]  SARS Coronavirus 2 [facility](Hospital order, Performed in [facility] hospital lab)     Status: None   Collection Time: 02/28/19  4:26 PM  Result Value Ref Range Status   SARS Coronavirus 2 NEGATIVE NEGATIVE Final    Comment: (NOTE) If result is NEGATIVE SARS-CoV-2 target nucleic acids are NOT DETECTED. The SARS-CoV-2 RNA is generally detectable in upper and lower  respiratory specimens during the acute phase of infection. The lowest  concentration of SARS-CoV-2 viral copies this assay can detect is 250  copies / mL. A negative result does not preclude SARS-CoV-2 infection  and should not be used as the sole basis for treatment or other  patient management decisions.  A negative result may occur with  improper specimen collection / handling, submission of specimen other  than nasopharyngeal swab, presence of viral mutation(s) within the  areas targeted by this assay, and inadequate number of viral copies  (<250 copies / mL). A negative result must be combined with clinical  observations, patient history, and epidemiological information. If result is POSITIVE SARS-CoV-2 target nucleic acids are DETECTED. The SARS-CoV-2 RNA is generally detectable in upper  and lower  respiratory specimens dur ing the acute phase of infection.  Positive  results are indicative of active infection with SARS-CoV-2.  Clinical  correlation with patient history and other diagnostic information is  necessary to determine patient infection status.  Positive results do  not rule out bacterial infection or co-infection with other viruses. If result is PRESUMPTIVE POSTIVE SARS-CoV-2 nucleic acids MAY BE PRESENT.   A presumptive positive result was obtained on the submitted specimen  and confirmed on repeat testing.  While 2019 novel coronavirus  (SARS-CoV-2) nucleic acids may be present in the submitted sample  additional confirmatory testing may be necessary for epidemiological  and / or clinical management purposes  to differentiate between  SARS-CoV-2 and other Sarbecovirus currently known to infect humans.  If clinically indicated additional testing with an alternate test  methodology [phone]([id number]) is advised. The SARS-CoV-2 RNA is generally  detectable in upper and lower respiratory sp ecimens during the acute  phase of infection. The expected result is Negative. Fact Sheet for Patients:  BoilerBrush.com.cyhttps://www.fda.gov/media/136312/download Fact Sheet for Healthcare Providers: https://pope.com/https://www.fda.gov/media/136313/download This test is not yet approved or cleared by the [place] FDA and has been authorized for detection and/or diagnosis of SARS-CoV-2 by FDA under an Emergency Use Authorization (EUA).  This EUA will remain in effect (meaning this test can be used) for the duration of the COVID-19 declaration under Section 564(b)(1) of the Act, 21 U.S.C. section 360bbb-3(b)(1), unless the authorization is terminated or revoked sooner. Performed at [facility], [address] [place], [place] [address]     RADIOLOGY:  No results found.  EKG:   Orders placed or performed during the hospital encounter of 02/26/19  . ED EKG  . ED EKG  . EKG 12-Lead  . EKG  12-Lead      Management plans discussed with the patient, family and they are in agreement.  CODE STATUS:     Code Status Orders  (From admission, onward)         Start     Ordered   02/26/19 2059  Full code  Continuous     02/26/19 2058        Code  Status History    Date Active Date Inactive Code Status Order ID Comments User Context   09/22/2018 1914 09/23/2018 1216 Full Code [id number]  Arnaldo NatalMalinda, Paul F, MD ED   09/17/2018 2023 09/18/2018 2221 Full Code [id number]  Ihor AustinPyreddy, Pavan, MD Inpatient   06/13/2014 1643 06/14/2014 1839 Full Code [id number]  Levie HeritageStinson, Jacob J, DO Inpatient   Advance Care Planning Activity    Advance Directive Documentation     Most Recent Value  Type of Advance Directive  Living will  Pre-existing out of facility DNR order (yellow form or pink MOST form)  -  "MOST" Form in Place?  -      TOTAL TIME TAKING CARE OF THIS PATIENT: 35 minutes.    Altamese DillingVaibhavkumar Baeleigh Devincent M.D on 03/02/2019 at 12:17 PM  Between 7am to 6pm - Pager - [phone]  After 6pm go to [email or web address] - password [email or web address]  [facility]  Office  [phone]  CC: Primary care physician; Patient, [name]   Note: This dictation was prepared with Dragon dictation along with smaller phrase technology. Any transcriptional errors that result from this process are unintentional.

## 2019-03-02 NOTE — Progress Notes (Signed)
Discharge instructions and prescriptions reviewed with patient. Verbalization of understanding given. Time allowed for questions. Patient awaiting his ride for pickup at 3pm.

## 2019-03-02 NOTE — Progress Notes (Signed)
Pt d/c to home via taxi with voucher. IV removed intact. VSS. Education completed earlier with Dispensing optician

## 2019-05-24 ENCOUNTER — Other Ambulatory Visit: Payer: Self-pay

## 2019-05-24 ENCOUNTER — Encounter: Payer: Self-pay | Admitting: Emergency Medicine

## 2019-05-24 ENCOUNTER — Emergency Department
Admission: EM | Admit: 2019-05-24 | Discharge: 2019-05-25 | Disposition: A | Discharge status: Home / Self Care | Payer: Self-pay | Attending: Emergency Medicine | Admitting: Emergency Medicine

## 2019-05-24 DIAGNOSIS — Z79899 Other long term (current) drug therapy: Secondary | ICD-10-CM | POA: Insufficient documentation

## 2019-05-24 DIAGNOSIS — Y908 Blood alcohol level of 240 mg/100 ml or more: Secondary | ICD-10-CM | POA: Insufficient documentation

## 2019-05-24 DIAGNOSIS — S0181XA Laceration without foreign body of other part of head, initial encounter: Secondary | ICD-10-CM | POA: Insufficient documentation

## 2019-05-24 DIAGNOSIS — F17210 Nicotine dependence, cigarettes, uncomplicated: Secondary | ICD-10-CM | POA: Insufficient documentation

## 2019-05-24 DIAGNOSIS — Y92019 Unspecified place in single-family (private) house as the place of occurrence of the external cause: Secondary | ICD-10-CM | POA: Insufficient documentation

## 2019-05-24 DIAGNOSIS — F10929 Alcohol use, unspecified with intoxication, unspecified: Secondary | ICD-10-CM | POA: Insufficient documentation

## 2019-05-24 DIAGNOSIS — Y9389 Activity, other specified: Secondary | ICD-10-CM | POA: Insufficient documentation

## 2019-05-24 DIAGNOSIS — S0990XA Unspecified injury of head, initial encounter: Secondary | ICD-10-CM

## 2019-05-24 DIAGNOSIS — W010XXA Fall on same level from slipping, tripping and stumbling without subsequent striking against object, initial encounter: Secondary | ICD-10-CM | POA: Insufficient documentation

## 2019-05-24 DIAGNOSIS — Y998 Other external cause status: Secondary | ICD-10-CM | POA: Insufficient documentation

## 2019-05-24 MED ORDER — THIAMINE HCL 100 MG/ML IJ SOLN
100.0000 mg | Freq: Once | INTRAMUSCULAR | Status: AC
  Administered 2019-05-24: 100 mg via INTRAVENOUS
  Filled 2019-05-24: qty 2

## 2019-05-24 NOTE — ED Provider Notes (Signed)
[facility] Emergency Department Provider Note  ____________________________________________   None    (approximate)  I have reviewed the triage vital signs and the nursing notes.   HISTORY  Chief Complaint Seizures and Head Injury  Level 5 caveat:  history/ROS limited by acute intoxication and/or acute illness/injury  HPI Terry Clark is a 48 y.o. male with a history of alcohol abuse and alcohol withdrawal seizures who presents by EMS for evaluation after a fall.  The exact circumstances are unclear  but according to the patient's wife via EMS, he came home from drinking with a friend and then fell face first and lost consciousness.  There was no witnessed seizure-like activity.  The patient says he does not know if he had a seizure.  Apparently he was more or less at his intoxicated baseline when he was awake and he was yelling and cursing in the lobby when he was waiting to be seen.  He reports some pain in his forehead and his face but denies pain anywhere else.  He denies recent contact with COVID-19 patients.  He has no injuries to his hands or his legs.  He denies chest pain, shortness of breath, cough, and abdominal pain.  He has no visual changes.  The onset of his syncope and collapse was apparently acute and severe.  He is currently awake and conversant and alert but clearly intoxicated.        Past Medical History:  Diagnosis Date  . Alcohol abuse   . Alcohol withdrawal seizure [facility])     Patient Active Problem List   Diagnosis Date Noted  . Alcohol withdrawal (HCC) 02/26/2019  . Hematemesis   . GI bleed 09/17/2018  . Alcoholic intoxication with complication (HCC)   . Anger   . Subconjunctival hemorrhage of both eyes   . Alcohol withdrawal seizure (HCC) 06/13/2014  . 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 ESOPHAGOGASTRODUODENOSCOPY N/A 09/18/2018   Procedure: ESOPHAGOGASTRODUODENOSCOPY  (EGD);  Surgeon: Toney Reil, MD;  Location: [facility] ENDOSCOPY;  Service: Gastroenterology;  Laterality: N/A;  . TONSILLECTOMY      Prior to Admission medications   Medication Sig Start Date End Date Taking? Authorizing Provider  folic acid (FOLVITE) 1 MG tablet Take 1 tablet (1 mg total) by mouth daily. Patient not taking: Reported on 05/25/2019 03/03/19   Altamese Dilling, MD  Multiple Vitamin (MULTIVITAMIN WITH MINERALS) TABS tablet Take 1 tablet by mouth daily. Patient not taking: Reported on 05/25/2019 03/03/19   Altamese Dilling, MD  thiamine 100 MG tablet Take 1 tablet (100 mg total) by mouth daily. Patient not taking: Reported on 05/25/2019 03/03/19   Altamese Dilling, MD    Allergies Patient has no known allergies.  Family History  Problem Relation Age of Onset  . Kidney failure Mother   . Cancer Other     Social History Social History   Tobacco Use  . Smoking status: Current Every Day Smoker    Packs/day: 1.00  . Smokeless tobacco: Never Used  Substance Use Topics  . Alcohol use: Yes    Comment: today "too much"  . Drug use: Not Currently    Review of Systems Level 5 caveat:  history/ROS limited by acute intoxication and/or acute illness/injury.  Denies chest pain, shortness of breath, abdominal pain, and pain in his arms or his legs.  Reports pain in his forehead and face.  ____________________________________________   PHYSICAL EXAM:  VITAL SIGNS: ED  Triage Vitals  Enc Vitals Group     BP 05/24/19 2255 (!) 164/109     Pulse Rate 05/24/19 2255 86     Resp 05/24/19 2255 17     Temp --      Temp Source 05/24/19 2255 Oral     SpO2 05/24/19 2254 98 %     Weight 05/24/19 2255 71.2 kg (157 lb)     Height 05/24/19 2255 1.702 m (5\' 7" )     Head Circumference --      Peak Flow --      Pain Score 05/24/19 2254 7     Pain Loc --      Pain Edu? --      Excl. in GC? --     Constitutional: Alert and oriented but clearly intoxicated and  disheveled. Eyes: Conjunctivae are normal.  Head: The patient has a large hematoma with a relatively superficial laceration near his right eyebrow.  He has what appears to be some smaller contusions on his face with no other lacerations.  No hemotympanum. Nose: No congestion/rhinnorhea.  No epistaxis. Mouth/Throat: Mucous membranes are moist.  No obvious dental or mandibular injuries.  No trismus. Neck: No stridor.  No meningeal signs.   Cardiovascular: Normal rate, regular rhythm. Good peripheral circulation. Grossly normal heart sounds. Respiratory: Normal respiratory effort.  No retractions. Gastrointestinal: Soft and nontender. No distention.  Musculoskeletal: No lower extremity tenderness nor edema. No gross deformities of extremities.  The patient is moving all 4 extremities and has no obvious injuries including no fractures, dislocations, or lacerations.  No tenderness to palpation of the cervical spine and no pain or tenderness with flexion/extension and rotation side to side of his head and neck. Neurologic:  Normal speech and language. No gross focal neurologic deficits are appreciated.  Skin:  Skin is warm, dry and intact except for the forehead laceration on the hematoma near the right eyebrow.   ____________________________________________   LABS (all labs ordered are listed, but only abnormal results are displayed)  Labs Reviewed  LACTIC ACID, PLASMA - Abnormal; Notable for the following components:      Result Value   Lactic Acid, Venous 2.5 (*)    All other components within normal limits  COMPREHENSIVE METABOLIC PANEL - Abnormal; Notable for the following components:   Chloride 96 (*)    Glucose, Bld 111 (*)    Calcium 8.7 (*)    AST 318 (*)    ALT 180 (*)    All other components within normal limits  URINALYSIS, ROUTINE W REFLEX MICROSCOPIC - Abnormal; Notable for the following components:   Color, Urine STRAW (*)    APPearance CLEAR (*)    Specific Gravity, Urine  1.002 (*)    Hgb urine dipstick SMALL (*)    All other components within normal limits  URINE DRUG SCREEN, QUALITATIVE ([facility] ONLY) - Abnormal; Notable for the following components:   Cannabinoid 50 Ng, Ur [place] POSITIVE (*)    All other components within normal limits  ETHANOL - Abnormal; Notable for the following components:   Alcohol, Ethyl (B) 414 (*)    All other components within normal limits  CBC WITH DIFFERENTIAL/PLATELET - Abnormal; Notable for the following components:   WBC 2.4 (*)    Platelets 103 (*)    Neutro Abs 0.8 (*)    All other components within normal limits  LACTIC ACID, PLASMA - Abnormal; Notable for the following components:   Lactic Acid, Venous 2.2 (*)  All other components within normal limits  MAGNESIUM  CBC WITH DIFFERENTIAL/PLATELET  CBC WITH DIFFERENTIAL/PLATELET   ____________________________________________  EKG  No indication for EKG ____________________________________________  RADIOLOGY I, Loleta Rose, personally viewed and evaluated these images (plain radiographs) as part of my medical decision making, as well as reviewing the written report by the radiologist.  ED MD interpretation:  No acute abnormalities on CT head nor C-spine.  Official radiology report(s): Ct Head Wo Contrast  Result Date: 05/25/2019 CLINICAL DATA:  Head trauma. Seizure with head strike. Right supraorbital laceration. EXAM: CT HEAD WITHOUT CONTRAST CT MAXILLOFACIAL WITHOUT CONTRAST CT CERVICAL SPINE WITHOUT CONTRAST TECHNIQUE: Multidetector CT imaging of the head, cervical spine, and maxillofacial structures were performed using the standard protocol without intravenous contrast. Multiplanar CT image reconstructions of the cervical spine and maxillofacial structures were also generated. COMPARISON:  Most recent head CT February 26, 2019 FINDINGS: CT HEAD FINDINGS Brain: No evidence of acute infarction, hemorrhage, hydrocephalus, extra-axial collection or mass lesion/mass  effect. Symmetric prominence of the ventricles, cisterns and sulci compatible with somewhat age advanced parenchymal volume loss Vascular: Atherosclerotic calcification of the carotid siphons. No hyperdense vessel. Right dominance of the dural venous drainage. Skull: Right frontal scalp and supraorbital soft tissue swelling and laceration with a small hematoma measuring up to 8 mm in maximal thickness. No subjacent calvarial fracture Other: None. CT MAXILLOFACIAL FINDINGS Osseous: No fracture of the bony orbits. Remote appearing fracture of the left nasal bone. Nasal bone and anterior maxillary processes are otherwise intact. No acute mid face fractures are seen. The pterygoid plates are intact. The mandible is intact. Temporomandibular joints are normally aligned. No temporal bone fractures are identified. Complete absence of the maxillary dentition. Mandibular prognathism is noted. Partial absence of the mandibular dentition with a periapical lucency of the right mandibular first premolar, tooth 5. Orbits: The globes appear normal and symmetric. Symmetric appearance of the extraocular musculature and optic nerve sheath complexes. Normal caliber of the superior ophthalmic veins. Sinuses: Mild mural thickening of the maxillary sinuses. Remainder of the paranasal sinuses are predominantly clear. Middle ear cavities are clear. External auditory canals are patent. Soft tissues: Right periorbital soft tissue swelling and laceration, as detailed above. Minimal palpebral swelling. Small amount of soft tissue gas associated with laceration. No other gas or foreign body is identified. CT CERVICAL SPINE FINDINGS Alignment: Likely degenerative reversal of the normal cervical lordosis centered at C5-6. No traumatic listhesis. No abnormal facet widening. Normal alignment of the craniocervical and atlantoaxial articulations. Skull base and vertebrae: No acute fracture. No primary bone lesion or focal pathologic process. Soft  tissues and spinal canal: No prevertebral fluid or swelling. No visible canal hematoma. Disc levels: Multilevel intervertebral disc height loss with spondylitic endplate changes. Maximal features at C5-6 with a disc osteophyte complex this level resulting in at least mild spinal canal stenosis. Uncinate spurring and facet hypertrophic changes result in moderate bilateral foraminal narrowing at C5-6 and C6-7. No severe canal or foraminal stenosis is identified. Upper chest: No acute abnormality in the upper chest or imaged lung apices. Other: None. IMPRESSION: 1. No acute intracranial abnormality. Age advanced parenchymal volume loss. 2. Right frontal scalp and supraorbital soft tissue swelling and laceration with a small hematoma measuring up to 8 mm in maximal thickness. 3. No subjacent calvarial or facial bone fracture, no visible globe injury. 4. Remote left nasal bone fracture. 5. No acute cervical spine fracture. Multilevel degenerative changes of the cervical spine as described above. 6. 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 Electronically Signed   By: Kreg ShropshirePrice  DeHay M.D.   On: 05/25/2019 01:09   Ct Cervical Spine Wo Contrast  Result Date: 05/25/2019 CLINICAL DATA:  Head trauma. Seizure with head strike. Right supraorbital laceration. EXAM: CT HEAD WITHOUT CONTRAST CT MAXILLOFACIAL WITHOUT CONTRAST CT CERVICAL SPINE WITHOUT CONTRAST TECHNIQUE: Multidetector CT imaging of the head, cervical spine, and maxillofacial structures were performed using the standard protocol without intravenous contrast. Multiplanar CT image reconstructions of the cervical spine and maxillofacial structures were also generated. COMPARISON:  Most recent head CT February 26, 2019 FINDINGS: CT HEAD FINDINGS Brain: No evidence of acute infarction, hemorrhage, hydrocephalus, extra-axial collection or mass lesion/mass effect. Symmetric prominence of the ventricles, cisterns and sulci compatible with  somewhat age advanced parenchymal volume loss Vascular: Atherosclerotic calcification of the carotid siphons. No hyperdense vessel. Right dominance of the dural venous drainage. Skull: Right frontal scalp and supraorbital soft tissue swelling and laceration with a small hematoma measuring up to 8 mm in maximal thickness. No subjacent calvarial fracture Other: None. CT MAXILLOFACIAL FINDINGS Osseous: No fracture of the bony orbits. Remote appearing fracture of the left nasal bone. Nasal bone and anterior maxillary processes are otherwise intact. No acute mid face fractures are seen. The pterygoid plates are intact. The mandible is intact. Temporomandibular joints are normally aligned. No temporal bone fractures are identified. Complete absence of the maxillary dentition. Mandibular prognathism is noted. Partial absence of the mandibular dentition with a periapical lucency of the right mandibular first premolar, tooth 5. Orbits: The globes appear normal and symmetric. Symmetric appearance of the extraocular musculature and optic nerve sheath complexes. Normal caliber of the superior ophthalmic veins. Sinuses: Mild mural thickening of the maxillary sinuses. Remainder of the paranasal sinuses are predominantly clear. Middle ear cavities are clear. External auditory canals are patent. Soft tissues: Right periorbital soft tissue swelling and laceration, as detailed above. Minimal palpebral swelling. Small amount of soft tissue gas associated with laceration. No other gas or foreign body is identified. CT CERVICAL SPINE FINDINGS Alignment: Likely degenerative reversal of the normal cervical lordosis centered at C5-6. No traumatic listhesis. No abnormal facet widening. Normal alignment of the craniocervical and atlantoaxial articulations. Skull base and vertebrae: No acute fracture. No primary bone lesion or focal pathologic process. Soft tissues and spinal canal: No prevertebral fluid or swelling. No visible canal  hematoma. Disc levels: Multilevel intervertebral disc height loss with spondylitic endplate changes. Maximal features at C5-6 with a disc osteophyte complex this level resulting in at least mild spinal canal stenosis. Uncinate spurring and facet hypertrophic changes result in moderate bilateral foraminal narrowing at C5-6 and C6-7. No severe canal or foraminal stenosis is identified. Upper chest: No acute abnormality in the upper chest or imaged lung apices. Other: None. IMPRESSION: 1. No acute intracranial abnormality. Age advanced parenchymal volume loss. 2. Right frontal scalp and supraorbital soft tissue swelling and laceration with a small hematoma measuring up to 8 mm in maximal thickness. 3. No subjacent calvarial or facial bone fracture, no visible globe injury. 4. Remote left nasal bone fracture. 5. No acute cervical spine fracture. Multilevel degenerative changes of the cervical spine as described above. 6. 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 Electronically Signed   By: Kreg ShropshirePrice  DeHay M.D.   On: 05/25/2019 01:09   Ct Maxillofacial Wo Contrast  Result Date: 05/25/2019 CLINICAL DATA:  Head trauma. Seizure with head strike. Right supraorbital laceration. EXAM: CT HEAD WITHOUT CONTRAST CT MAXILLOFACIAL  WITHOUT CONTRAST CT CERVICAL SPINE WITHOUT CONTRAST TECHNIQUE: Multidetector CT imaging of the head, cervical spine, and maxillofacial structures were performed using the standard protocol without intravenous contrast. Multiplanar CT image reconstructions of the cervical spine and maxillofacial structures were also generated. COMPARISON:  Most recent head CT February 26, 2019 FINDINGS: CT HEAD FINDINGS Brain: No evidence of acute infarction, hemorrhage, hydrocephalus, extra-axial collection or mass lesion/mass effect. Symmetric prominence of the ventricles, cisterns and sulci compatible with somewhat age advanced parenchymal volume loss Vascular: Atherosclerotic  calcification of the carotid siphons. No hyperdense vessel. Right dominance of the dural venous drainage. Skull: Right frontal scalp and supraorbital soft tissue swelling and laceration with a small hematoma measuring up to 8 mm in maximal thickness. No subjacent calvarial fracture Other: None. CT MAXILLOFACIAL FINDINGS Osseous: No fracture of the bony orbits. Remote appearing fracture of the left nasal bone. Nasal bone and anterior maxillary processes are otherwise intact. No acute mid face fractures are seen. The pterygoid plates are intact. The mandible is intact. Temporomandibular joints are normally aligned. No temporal bone fractures are identified. Complete absence of the maxillary dentition. Mandibular prognathism is noted. Partial absence of the mandibular dentition with a periapical lucency of the right mandibular first premolar, tooth 5. Orbits: The globes appear normal and symmetric. Symmetric appearance of the extraocular musculature and optic nerve sheath complexes. Normal caliber of the superior ophthalmic veins. Sinuses: Mild mural thickening of the maxillary sinuses. Remainder of the paranasal sinuses are predominantly clear. Middle ear cavities are clear. External auditory canals are patent. Soft tissues: Right periorbital soft tissue swelling and laceration, as detailed above. Minimal palpebral swelling. Small amount of soft tissue gas associated with laceration. No other gas or foreign body is identified. CT CERVICAL SPINE FINDINGS Alignment: Likely degenerative reversal of the normal cervical lordosis centered at C5-6. No traumatic listhesis. No abnormal facet widening. Normal alignment of the craniocervical and atlantoaxial articulations. Skull base and vertebrae: No acute fracture. No primary bone lesion or focal pathologic process. Soft tissues and spinal canal: No prevertebral fluid or swelling. No visible canal hematoma. Disc levels: Multilevel intervertebral disc height loss with  spondylitic endplate changes. Maximal features at C5-6 with a disc osteophyte complex this level resulting in at least mild spinal canal stenosis. Uncinate spurring and facet hypertrophic changes result in moderate bilateral foraminal narrowing at C5-6 and C6-7. No severe canal or foraminal stenosis is identified. Upper chest: No acute abnormality in the upper chest or imaged lung apices. Other: None. IMPRESSION: 1. No acute intracranial abnormality. Age advanced parenchymal volume loss. 2. Right frontal scalp and supraorbital soft tissue swelling and laceration with a small hematoma measuring up to 8 mm in maximal thickness. 3. No subjacent calvarial or facial bone fracture, no visible globe injury. 4. Remote left nasal bone fracture. 5. No acute cervical spine fracture. Multilevel degenerative changes of the cervical spine as described above. 6. 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 Electronically Signed   By: Lovena Le M.D.   On: 05/25/2019 01:09    ____________________________________________   PROCEDURES   Procedure(s) performed (including Critical Care):  Marland KitchenMarland KitchenLaceration Repair  Date/Time: 05/25/2019 6:22 AM Performed by: Hinda Kehr, MD Authorized by: Hinda Kehr, MD   Consent:    Consent obtained:  Verbal   Consent given by:  Patient Anesthesia (see MAR for exact dosages):    Anesthesia method:  None Laceration details:    Location:  Face   Face location:  Forehead   Length (cm):  0.8 Repair type:    Repair type:  Simple Exploration:    Contaminated: no   Treatment:    Amount of cleaning:  Standard   Visualized foreign bodies/material removed: no   Skin repair:    Repair method:  Steri-Strips   Number of Steri-Strips:  1 Approximation:    Approximation:  Close Post-procedure details:    Dressing:  Open (no dressing)   Patient tolerance of procedure:  Tolerated well, no immediate complications      ____________________________________________   INITIAL IMPRESSION / MDM / ASSESSMENT AND PLAN / ED COURSE  As part of my medical decision making, I reviewed the following data within the electronic MEDICAL RECORD NUMBER Nursing notes reviewed and incorporated, Labs reviewed , Old chart reviewed, Notes from prior ED visits and [place] Controlled Substance Database   Differential diagnosis includes, but is not limited to, alcohol intoxication leading to syncope and mechanical fall, seizure or seizure-like activity, alcohol withdrawal seizure, facial fracture, intracranial bleeding, cervical spine injury.  I strongly doubt alcohol withdrawal seizure given that he is clearly intoxicated at this time and that report is consistent with what his wife told EMS.  At this point I strongly doubt seizure as well and I suspect he simply passed out when he came home from a night out drinking.  I am giving him a dose of thiamine 100 mg IV but will hold on IV fluids at this time as I doubt that they are indicated.  He is not tachycardic and he is a little bit hypertensive, not hemodynamically unstable to where he would need fluids.  He has no neck pain or tenderness but I cannot clear his cervical spine with Nexus criteria based on his intoxication.  I am ordering CT scans of his head, face, and neck as well as basic lab work.  I am ordering a lactic acid to try to further assess the possibility of him having had true seizures but I think this is unlikely.  I have explained to the patient that we are going to get some imaging and watch him to make sure he is safe for a period of time.  He says that he understands and agrees.  At this point there is no indication for putting him under involuntary commitment unless he tries to leave while not having the capacity to make decisions for himself      Clinical Course as of May 24 621  Sun May 25, 2019  0024 Reassuring UA  Urinalysis, Routine w reflex microscopic(!) [CF]  0030  Cannabinoid 50 Ng, Ur [place](!): POSITIVE [CF]  0041 LFT elevation, but the patient is a confirmed chronic alcoholic and has no abdominal tenderness to palpation.  Comprehensive metabolic panel(!) [CF]  0208 Patient sleeping comfortably   [CF]  0208 CT scans were reassuring with no evidence of emergent traumatic injury at this time other than the hematoma.   [CF]  [phone] Patient has been stable all night.  I woke him up and he is in no distress, says he feels better except for being sore.  He will call for a sober adult to come pick him up.  No indication of emergent medical condition.  I gave my usual and customary return precautions.   [CF]    Clinical Course User Index [CF] Loleta RoseForbach, Magdelena Kinsella, MD     ____________________________________________  FINAL CLINICAL IMPRESSION(S) / ED DIAGNOSES  Final diagnoses:  Alcoholic intoxication with complication (HCC)  Injury of head, initial encounter  Laceration of forehead,  initial encounter     MEDICATIONS GIVEN DURING THIS VISIT:  Medications  thiamine (B-1) injection 100 mg (100 mg Intravenous Given 05/24/19 2359)  sodium chloride 0.9 % bolus 1,000 mL (0 mLs Intravenous Stopped 05/25/19 0244)  sodium chloride 0.9 % bolus 1,000 mL (0 mLs Intravenous Stopped 05/25/19 0612)     ED Discharge Orders    None      *Please note:  DONATHAN BULLER was evaluated in Emergency Department on 05/25/2019 for the symptoms described in the history of present illness. He was evaluated in the context of the global COVID-19 pandemic, which necessitated consideration that the patient might be at risk for infection with the SARS-CoV-2 virus that causes COVID-19. Institutional protocols and algorithms that pertain to the evaluation of patients at risk for COVID-19 are in a state of rapid change based on information released by regulatory bodies including the CDC and federal and state organizations. These policies and algorithms were followed during the patient's care  in the ED.  Some ED evaluations and interventions may be delayed as a result of limited staffing during the pandemic.*  Note:  This document was prepared using Dragon voice recognition software and may include unintentional dictation errors.   Loleta Rose, MD 05/25/19 [phone]

## 2019-05-24 NOTE — ED Notes (Signed)
Patient to waiting room via wheelchair by EMS from home.  Per EMS they were called by patient's wife after returning home from "drinking" with a friend and fell face first, wife reported possible loss of consciousness.  On arrival to waiting room patient is cursing and yelling because he is in the waiting room.

## 2019-05-24 NOTE — ED Notes (Signed)
Assessment: pt intoxicated. Pt state has been drinking all day. Pt states he thinks he fell and hit his head but he does not know what he struck. Pt states he may have had a seizure, pt has a history of same and does not currently take medication for seizures. Pt with  Hematoma noted to right eyebrow extending to right forehead with small laceration with controlled bleeding. No drainage noted from ears or nose. Pt moving all extremities without difficulty, perrl. Pt with slurred speech noted. Pt asking for ativan, but then falls asleep during conversation. No sweating noted, no tremors noted.

## 2019-05-24 NOTE — ED Triage Notes (Signed)
Pt brought in by EMS from home; says he had a seizure and "fell out"; pt fell, no idea what he hit his head one; hematoma above right eye with laceration present; bleeding controlled; pt admits to drinking today;

## 2019-05-24 NOTE — ED Notes (Signed)
Seizure pads and fall pads placed around bed. Bed in low locked position, siderails up. Urinal at side.

## 2019-05-25 ENCOUNTER — Emergency Department: Payer: Self-pay

## 2019-05-25 LAB — CBC WITH DIFFERENTIAL/PLATELET
Abs Immature Granulocytes: 0.01 10*3/uL (ref 0.00–0.07)
Basophils Absolute: 0 10*3/uL (ref 0.0–0.1)
Basophils Relative: 1 %
Eosinophils Absolute: 0.1 10*3/uL (ref 0.0–0.5)
Eosinophils Relative: 2 %
HCT: 40.4 % (ref 39.0–52.0)
Hemoglobin: 14 g/dL (ref 13.0–17.0)
Immature Granulocytes: 0 %
Lymphocytes Relative: 44 %
Lymphs Abs: 1.1 10*3/uL (ref 0.7–4.0)
MCH: 32 pg (ref 26.0–34.0)
MCHC: 34.7 g/dL (ref 30.0–36.0)
MCV: 92.4 fL (ref 80.0–100.0)
Monocytes Absolute: 0.4 10*3/uL (ref 0.1–1.0)
Monocytes Relative: 18 %
Neutro Abs: 0.8 10*3/uL — ABNORMAL LOW (ref 1.7–7.7)
Neutrophils Relative %: 35 %
Platelets: 103 10*3/uL — ABNORMAL LOW (ref 150–400)
RBC: 4.37 MIL/uL (ref 4.22–5.81)
RDW: 15 % (ref 11.5–15.5)
WBC: 2.4 10*3/uL — ABNORMAL LOW (ref 4.0–10.5)
nRBC: 0 % (ref 0.0–0.2)

## 2019-05-25 LAB — URINALYSIS, ROUTINE W REFLEX MICROSCOPIC
Bacteria, UA: NONE SEEN
Bilirubin Urine: NEGATIVE
Glucose, UA: NEGATIVE mg/dL
Ketones, ur: NEGATIVE mg/dL
Leukocytes,Ua: NEGATIVE
Nitrite: NEGATIVE
Protein, ur: NEGATIVE mg/dL
Specific Gravity, Urine: 1.002 — ABNORMAL LOW (ref 1.005–1.030)
Squamous Epithelial / HPF: NONE SEEN (ref 0–5)
pH: 6 (ref 5.0–8.0)

## 2019-05-25 LAB — COMPREHENSIVE METABOLIC PANEL
ALT: 180 U/L — ABNORMAL HIGH (ref 0–44)
AST: 318 U/L — ABNORMAL HIGH (ref 15–41)
Albumin: 4.2 g/dL (ref 3.5–5.0)
Alkaline Phosphatase: 73 U/L (ref 38–126)
Anion gap: 15 (ref 5–15)
BUN: 7 mg/dL (ref 6–20)
CO2: 25 mmol/L (ref 22–32)
Calcium: 8.7 mg/dL — ABNORMAL LOW (ref 8.9–10.3)
Chloride: 96 mmol/L — ABNORMAL LOW (ref 98–111)
Creatinine, Ser: 0.77 mg/dL (ref 0.61–1.24)
GFR calc Af Amer: >60 mL/min (ref >60)
GFR calc non Af Amer: >60 mL/min (ref >60)
Glucose, Bld: 111 mg/dL — ABNORMAL HIGH (ref 70–99)
Potassium: 4 mmol/L (ref 3.5–5.1)
Sodium: 136 mmol/L (ref 135–145)
Total Bilirubin: 1 mg/dL (ref 0.3–1.2)
Total Protein: 7.8 g/dL (ref 6.5–8.1)

## 2019-05-25 LAB — URINE DRUG SCREEN, QUALITATIVE (ARMC ONLY)
Amphetamines, Ur Screen: NOT DETECTED
Barbiturates, Ur Screen: NOT DETECTED
Benzodiazepine, Ur Scrn: NOT DETECTED
Cannabinoid 50 Ng, Ur ~~LOC~~: POSITIVE — AB
Cocaine Metabolite,Ur ~~LOC~~: NOT DETECTED
MDMA (Ecstasy)Ur Screen: NOT DETECTED
Methadone Scn, Ur: NOT DETECTED
Opiate, Ur Screen: NOT DETECTED
Phencyclidine (PCP) Ur S: NOT DETECTED
Tricyclic, Ur Screen: NOT DETECTED

## 2019-05-25 LAB — MAGNESIUM: Magnesium: 2.3 mg/dL (ref 1.7–2.4)

## 2019-05-25 LAB — ETHANOL: Alcohol, Ethyl (B): 414 mg/dL (ref <10)

## 2019-05-25 LAB — LACTIC ACID, PLASMA
Lactic Acid, Venous: 2.5 mmol/L (ref 0.5–1.9)
Lactic Acid, Venous: 2.2 mmol/L (ref 0.5–1.9)

## 2019-05-25 MED ORDER — SODIUM CHLORIDE 0.9 % IV BOLUS
1000.0000 mL | Freq: Once | INTRAVENOUS | Status: AC
  Administered 2019-05-25: 1000 mL via INTRAVENOUS

## 2019-05-25 MED ORDER — SODIUM CHLORIDE 0.9 % IV BOLUS
1000.0000 mL | Freq: Once | INTRAVENOUS | Status: AC
  Administered 2019-05-25: 02:00:00 1000 mL via INTRAVENOUS

## 2019-05-25 MED ORDER — LORAZEPAM 1 MG PO TABS
1.0000 mg | ORAL_TABLET | Freq: Once | ORAL | Status: AC
  Administered 2019-05-25: 11:00:00 1 mg via ORAL
  Filled 2019-05-25: qty 1

## 2019-05-25 NOTE — Discharge Instructions (Addendum)
You were seen in the emergency department for alcohol intoxication and a fall.  Your medical workup was reassuring except for being heavily intoxicated.  Please seek help from the recommended resources for assistance with your alcohol dependence.  If you have any thoughts of hurting herself or others, please call 911 or return to the emergency department.  Please avoid drug and alcohol use.  Never drive a vehicle or operate machinery while intoxicated.

## 2019-05-25 NOTE — ED Notes (Signed)
Notified by robin in lab that will need blood sample recollect. Robin notified to send a phlebotomist.

## 2019-05-25 NOTE — ED Notes (Signed)
Pt d/c under the care of his wife.

## 2019-05-25 NOTE — ED Notes (Signed)
Pt noted to be resting in bed at this time with lights dimmed. NAD noted at this time. Will continue to monitor for further patient needs.

## 2019-05-25 NOTE — ED Notes (Signed)
Critical lactic acid of 2.5 and etoh of 414 called from lab. Dr. Karma Greaser notified, order for NS bolus received. Per dr. Karma Greaser, give NS bolus, then repeat lactic acid that was due at 01:05.

## 2019-05-25 NOTE — ED Notes (Signed)
Critical lactic of 2.2 called from lab at 0330. md notified at this time, orders for additional NS bolus already ordered by MD.

## 2019-05-25 NOTE — ED Notes (Signed)
Spoke with EDP regarding patient CIWA scale, and request for "something for withdrawals". VORB 1mg  ativan PO. Will administer per Md order.

## 2019-05-25 NOTE — ED Notes (Signed)
This RN to bedside, pt found sitting up in bed, states "they were supposed to give a pill for withdrawals but they never did, I need that pill to help with withdrawals!". This RN explained will speak with MD.

## 2019-05-25 NOTE — ED Notes (Signed)
Pt informed that he is to be discharged and needs to attempt to find a ride home. Pt verbalizes understanding.

## 2019-05-25 NOTE — ED Notes (Signed)
This RN to bedside, offered patient food and something to drink, patient refused. Pt denies knowing who to call for a ride. Will continue to monitor for further patient needs, awaiting a sober ride to pick patient up.

## 2019-05-25 NOTE — ED Notes (Signed)
Pt continues to sleep. approx 219mL left in NS bolus to infuse.

## 2019-05-25 NOTE — ED Notes (Signed)
This RN spoke with patient's wife who states she is sending Terry Clark to pick patient up, pt sitting up in bed, pt repeatedly requesting something "to help with these withdrawals". Pt with no noted tremor until asked to hold his arms up, localized tremors noted to his arms, no tremors noted to his arms or when hand is placed on his back, tremors stop when patient lays back, this RN asked patient how he feels, pt states "like shit". While this RN at bedside, pt lays back and closes his eyes, respirations even and unlabored at this time.

## 2019-05-25 NOTE — ED Notes (Signed)
Report to megan, rn.  

## 2019-05-26 ENCOUNTER — Other Ambulatory Visit: Payer: Self-pay

## 2019-05-26 ENCOUNTER — Inpatient Hospital Stay
Admission: EM | Admit: 2019-05-26 | Discharge: 2019-06-02 | DRG: 896 | Disposition: A | Discharge status: Home / Self Care | Payer: Self-pay | Attending: Internal Medicine | Admitting: Internal Medicine

## 2019-05-26 ENCOUNTER — Emergency Department: Payer: Self-pay

## 2019-05-26 DIAGNOSIS — F17210 Nicotine dependence, cigarettes, uncomplicated: Secondary | ICD-10-CM | POA: Diagnosis present

## 2019-05-26 DIAGNOSIS — E876 Hypokalemia: Secondary | ICD-10-CM | POA: Diagnosis present

## 2019-05-26 DIAGNOSIS — F10239 Alcohol dependence with withdrawal, unspecified: Principal | ICD-10-CM | POA: Diagnosis present

## 2019-05-26 DIAGNOSIS — W1830XA Fall on same level, unspecified, initial encounter: Secondary | ICD-10-CM | POA: Diagnosis present

## 2019-05-26 DIAGNOSIS — S0181XA Laceration without foreign body of other part of head, initial encounter: Secondary | ICD-10-CM | POA: Diagnosis present

## 2019-05-26 DIAGNOSIS — N170 Acute kidney failure with tubular necrosis: Secondary | ICD-10-CM | POA: Diagnosis present

## 2019-05-26 DIAGNOSIS — F10932 Alcohol use, unspecified with withdrawal with perceptual disturbance: Secondary | ICD-10-CM

## 2019-05-26 DIAGNOSIS — A044 Other intestinal Escherichia coli infections: Secondary | ICD-10-CM | POA: Diagnosis present

## 2019-05-26 DIAGNOSIS — S0003XA Contusion of scalp, initial encounter: Secondary | ICD-10-CM | POA: Diagnosis present

## 2019-05-26 DIAGNOSIS — G40909 Epilepsy, unspecified, not intractable, without status epilepticus: Secondary | ICD-10-CM | POA: Diagnosis present

## 2019-05-26 DIAGNOSIS — Z20828 Contact with and (suspected) exposure to other viral communicable diseases: Secondary | ICD-10-CM | POA: Diagnosis present

## 2019-05-26 DIAGNOSIS — R569 Unspecified convulsions: Secondary | ICD-10-CM

## 2019-05-26 DIAGNOSIS — F10232 Alcohol dependence with withdrawal with perceptual disturbance: Secondary | ICD-10-CM

## 2019-05-26 DIAGNOSIS — D61818 Other pancytopenia: Secondary | ICD-10-CM | POA: Diagnosis present

## 2019-05-26 DIAGNOSIS — K7010 Alcoholic hepatitis without ascites: Secondary | ICD-10-CM | POA: Diagnosis present

## 2019-05-26 LAB — COMPREHENSIVE METABOLIC PANEL
ALT: 135 U/L — ABNORMAL HIGH (ref 0–44)
AST: 206 U/L — ABNORMAL HIGH (ref 15–41)
Albumin: 3.8 g/dL (ref 3.5–5.0)
Alkaline Phosphatase: 60 U/L (ref 38–126)
Anion gap: 18 — ABNORMAL HIGH (ref 5–15)
BUN: 11 mg/dL (ref 6–20)
CO2: 18 mmol/L — ABNORMAL LOW (ref 22–32)
Calcium: 9 mg/dL (ref 8.9–10.3)
Chloride: 97 mmol/L — ABNORMAL LOW (ref 98–111)
Creatinine, Ser: 1.02 mg/dL (ref 0.61–1.24)
GFR calc Af Amer: >60 mL/min (ref >60)
GFR calc non Af Amer: >60 mL/min (ref >60)
Glucose, Bld: 176 mg/dL — ABNORMAL HIGH (ref 70–99)
Potassium: 3.4 mmol/L — ABNORMAL LOW (ref 3.5–5.1)
Sodium: 133 mmol/L — ABNORMAL LOW (ref 135–145)
Total Bilirubin: 1.5 mg/dL — ABNORMAL HIGH (ref 0.3–1.2)
Total Protein: 7 g/dL (ref 6.5–8.1)

## 2019-05-26 LAB — URINALYSIS, COMPLETE (UACMP) WITH MICROSCOPIC
Bacteria, UA: NONE SEEN
Bilirubin Urine: NEGATIVE
Glucose, UA: NEGATIVE mg/dL
Hgb urine dipstick: NEGATIVE
Ketones, ur: 5 mg/dL — AB
Leukocytes,Ua: NEGATIVE
Nitrite: NEGATIVE
Protein, ur: NEGATIVE mg/dL
Specific Gravity, Urine: 1.008 (ref 1.005–1.030)
Squamous Epithelial / HPF: NONE SEEN (ref 0–5)
pH: 6 (ref 5.0–8.0)

## 2019-05-26 LAB — C DIFFICILE QUICK SCREEN W PCR REFLEX
C Diff antigen: NEGATIVE
C Diff interpretation: NOT DETECTED
C Diff toxin: NEGATIVE

## 2019-05-26 LAB — CBC WITH DIFFERENTIAL/PLATELET
Abs Immature Granulocytes: 0.01 10*3/uL (ref 0.00–0.07)
Basophils Absolute: 0 10*3/uL (ref 0.0–0.1)
Basophils Relative: 1 %
Eosinophils Absolute: 0.1 10*3/uL (ref 0.0–0.5)
Eosinophils Relative: 2 %
HCT: 37.2 % — ABNORMAL LOW (ref 39.0–52.0)
Hemoglobin: 12.6 g/dL — ABNORMAL LOW (ref 13.0–17.0)
Immature Granulocytes: 0 %
Lymphocytes Relative: 27 %
Lymphs Abs: 0.8 10*3/uL (ref 0.7–4.0)
MCH: 32.1 pg (ref 26.0–34.0)
MCHC: 33.9 g/dL (ref 30.0–36.0)
MCV: 94.9 fL (ref 80.0–100.0)
Monocytes Absolute: 0.4 10*3/uL (ref 0.1–1.0)
Monocytes Relative: 14 %
Neutro Abs: 1.7 10*3/uL (ref 1.7–7.7)
Neutrophils Relative %: 56 %
Platelets: 82 10*3/uL — ABNORMAL LOW (ref 150–400)
RBC: 3.92 MIL/uL — ABNORMAL LOW (ref 4.22–5.81)
RDW: 15 % (ref 11.5–15.5)
WBC: 3.1 10*3/uL — ABNORMAL LOW (ref 4.0–10.5)
nRBC: 0 % (ref 0.0–0.2)

## 2019-05-26 LAB — MAGNESIUM: Magnesium: 2.2 mg/dL (ref 1.7–2.4)

## 2019-05-26 LAB — GASTROINTESTINAL PANEL BY PCR, STOOL (REPLACES STOOL CULTURE)
Adenovirus F40/41: NOT DETECTED
Astrovirus: NOT DETECTED
Campylobacter species: DETECTED — AB
Cryptosporidium: NOT DETECTED
Cyclospora cayetanensis: NOT DETECTED
Entamoeba histolytica: NOT DETECTED
Enteroaggregative E coli (EAEC): DETECTED — AB
Enteropathogenic E coli (EPEC): NOT DETECTED
Enterotoxigenic E coli (ETEC): NOT DETECTED
Giardia lamblia: NOT DETECTED
Norovirus GI/GII: NOT DETECTED
Plesimonas shigelloides: NOT DETECTED
Rotavirus A: NOT DETECTED
Salmonella species: NOT DETECTED
Sapovirus (I, II, IV, and V): NOT DETECTED
Shiga like toxin producing E coli (STEC): NOT DETECTED
Shigella/Enteroinvasive E coli (EIEC): NOT DETECTED
Vibrio cholerae: NOT DETECTED
Vibrio species: NOT DETECTED
Yersinia enterocolitica: NOT DETECTED

## 2019-05-26 LAB — URINE DRUG SCREEN, QUALITATIVE (ARMC ONLY)
Amphetamines, Ur Screen: NOT DETECTED
Barbiturates, Ur Screen: NOT DETECTED
Benzodiazepine, Ur Scrn: POSITIVE — AB
Cannabinoid 50 Ng, Ur ~~LOC~~: POSITIVE — AB
Cocaine Metabolite,Ur ~~LOC~~: NOT DETECTED
MDMA (Ecstasy)Ur Screen: NOT DETECTED
Methadone Scn, Ur: NOT DETECTED
Opiate, Ur Screen: NOT DETECTED
Phencyclidine (PCP) Ur S: NOT DETECTED
Tricyclic, Ur Screen: NOT DETECTED

## 2019-05-26 LAB — TROPONIN I (HIGH SENSITIVITY): Troponin I (High Sensitivity): 12 ng/L (ref <18)

## 2019-05-26 LAB — ETHANOL: Alcohol, Ethyl (B): <10 mg/dL (ref <10)

## 2019-05-26 LAB — PHOSPHORUS: Phosphorus: 4 mg/dL (ref 2.5–4.6)

## 2019-05-26 LAB — SARS CORONAVIRUS 2 BY RT PCR (HOSPITAL ORDER, PERFORMED IN ~~LOC~~ HOSPITAL LAB): SARS Coronavirus 2: NEGATIVE

## 2019-05-26 MED ORDER — ADULT MULTIVITAMIN W/MINERALS CH
1.0000 | ORAL_TABLET | Freq: Every day | ORAL | Status: DC
  Administered 2019-05-26 – 2019-06-02 (×8): 1 via ORAL
  Filled 2019-05-26 (×8): qty 1

## 2019-05-26 MED ORDER — LORAZEPAM 1 MG PO TABS
1.0000 mg | ORAL_TABLET | ORAL | Status: DC | PRN
  Filled 2019-05-26: qty 1
  Filled 2019-05-26: qty 2
  Filled 2019-05-26 (×4): qty 1

## 2019-05-26 MED ORDER — MAGNESIUM SULFATE 2 GM/50ML IV SOLN
2.0000 g | Freq: Once | INTRAVENOUS | Status: AC
  Administered 2019-05-26: 2 g via INTRAVENOUS
  Filled 2019-05-26: qty 50

## 2019-05-26 MED ORDER — LORAZEPAM 1 MG PO TABS: 1.0000 mg | ORAL_TABLET | ORAL | Status: DC | PRN

## 2019-05-26 MED ORDER — LORAZEPAM 1 MG PO TABS
1.0000 mg | ORAL_TABLET | ORAL | Status: DC | PRN
  Administered 2019-05-26: 2 mg via ORAL
  Administered 2019-05-27 (×4): 1 mg via ORAL

## 2019-05-26 MED ORDER — SODIUM CHLORIDE 0.9 % IV SOLN
INTRAVENOUS | Status: DC
  Administered 2019-05-26 – 2019-05-28 (×4): via INTRAVENOUS

## 2019-05-26 MED ORDER — ONDANSETRON HCL 4 MG PO TABS: 4.0000 mg | ORAL_TABLET | Freq: Four times a day (QID) | ORAL | Status: DC | PRN

## 2019-05-26 MED ORDER — POTASSIUM CHLORIDE 10 MEQ/100ML IV SOLN
10.0000 meq | INTRAVENOUS | Status: AC
  Administered 2019-05-26 (×2): 10 meq via INTRAVENOUS
  Filled 2019-05-26 (×4): qty 100

## 2019-05-26 MED ORDER — THIAMINE HCL 100 MG/ML IJ SOLN
100.0000 mg | Freq: Every day | INTRAMUSCULAR | Status: DC
  Filled 2019-05-26: qty 2

## 2019-05-26 MED ORDER — FOLIC ACID 1 MG PO TABS
1.0000 mg | ORAL_TABLET | Freq: Every day | ORAL | Status: DC
  Administered 2019-05-26 – 2019-06-02 (×8): 1 mg via ORAL
  Filled 2019-05-26 (×8): qty 1

## 2019-05-26 MED ORDER — VITAMIN B-1 100 MG PO TABS
100.0000 mg | ORAL_TABLET | Freq: Every day | ORAL | Status: DC
  Administered 2019-05-26 – 2019-06-02 (×8): 100 mg via ORAL
  Filled 2019-05-26 (×8): qty 1

## 2019-05-26 MED ORDER — DOCUSATE SODIUM 100 MG PO CAPS
100.0000 mg | ORAL_CAPSULE | Freq: Two times a day (BID) | ORAL | Status: DC
  Filled 2019-05-26 (×2): qty 1

## 2019-05-26 MED ORDER — ACETAMINOPHEN 650 MG RE SUPP: 650.0000 mg | Freq: Four times a day (QID) | RECTAL | Status: DC | PRN

## 2019-05-26 MED ORDER — HEPARIN SODIUM (PORCINE) 5000 UNIT/ML IJ SOLN
5000.0000 [IU] | Freq: Three times a day (TID) | INTRAMUSCULAR | Status: DC
  Administered 2019-05-26: 5000 [IU] via SUBCUTANEOUS
  Filled 2019-05-26: qty 1

## 2019-05-26 MED ORDER — VITAMIN B-1 100 MG PO TABS
100.0000 mg | ORAL_TABLET | Freq: Every day | ORAL | Status: DC
  Filled 2019-05-26: qty 1

## 2019-05-26 MED ORDER — ONDANSETRON HCL 4 MG/2ML IJ SOLN: 4.0000 mg | Freq: Four times a day (QID) | INTRAMUSCULAR | Status: DC | PRN

## 2019-05-26 MED ORDER — LORAZEPAM 2 MG/ML IJ SOLN
1.0000 mg | Freq: Once | INTRAMUSCULAR | Status: AC
  Administered 2019-05-26: 1 mg via INTRAVENOUS
  Filled 2019-05-26: qty 1

## 2019-05-26 MED ORDER — LORAZEPAM 2 MG/ML IJ SOLN
1.0000 mg | INTRAMUSCULAR | Status: DC | PRN
  Administered 2019-05-27 – 2019-05-28 (×4): 2 mg via INTRAVENOUS

## 2019-05-26 MED ORDER — PANTOPRAZOLE SODIUM 40 MG IV SOLR
40.0000 mg | Freq: Two times a day (BID) | INTRAVENOUS | Status: DC
  Administered 2019-05-26 – 2019-06-02 (×15): 40 mg via INTRAVENOUS
  Filled 2019-05-26 (×15): qty 40

## 2019-05-26 MED ORDER — LORAZEPAM 2 MG PO TABS
2.0000 mg | ORAL_TABLET | Freq: Once | ORAL | Status: AC
  Administered 2019-05-26: 2 mg via ORAL
  Filled 2019-05-26: qty 1

## 2019-05-26 MED ORDER — ADULT MULTIVITAMIN W/MINERALS CH: 1.0000 | ORAL_TABLET | Freq: Every day | ORAL | Status: DC

## 2019-05-26 MED ORDER — ACETAMINOPHEN 325 MG PO TABS: 650.0000 mg | ORAL_TABLET | Freq: Four times a day (QID) | ORAL | Status: DC | PRN

## 2019-05-26 MED ORDER — LORAZEPAM 2 MG/ML IJ SOLN
1.0000 mg | INTRAMUSCULAR | Status: DC | PRN
  Administered 2019-05-26: 1 mg via INTRAVENOUS
  Administered 2019-05-27: 2 mg via INTRAVENOUS
  Filled 2019-05-26 (×6): qty 1

## 2019-05-26 MED ORDER — BISACODYL 5 MG PO TBEC: 5.0000 mg | DELAYED_RELEASE_TABLET | Freq: Every day | ORAL | Status: DC | PRN

## 2019-05-26 MED ORDER — FOLIC ACID 1 MG PO TABS: 1.0000 mg | ORAL_TABLET | Freq: Every day | ORAL | Status: DC

## 2019-05-26 NOTE — ED Provider Notes (Signed)
[facility] Emergency Department Provider Note       Time seen: ----------------------------------------- 7:50 AM on 05/26/2019 -----------------------------------------   I have reviewed the triage vital signs and the nursing notes.  HISTORY   Chief Complaint Seizures and Fall    HPI Terry Clark is a 48 y.o. male with a history of alcohol abuse, withdrawal seizure, hepatitis, GI bleeding who presents to the ED for seizure activity according to his girlfriend.  She reports he had a seizure yesterday and was seen in the ER and then 3 seizures this morning.  Patient had a fall relative to the seizures and was noted to have bruising around his right eye and recent cut on his forehead.  He is also noted to have some blood in his mouth this morning.  Patient states last drink was 2 days ago.  Past Medical History:  Diagnosis Date  . Alcohol abuse   . Alcohol withdrawal seizure [facility])     Patient Active Problem List   Diagnosis Date Noted  . Alcohol withdrawal (HCC) 02/26/2019  . Hematemesis   . GI bleed 09/17/2018  . Alcoholic intoxication with complication (HCC)   . Anger   . Subconjunctival hemorrhage of both eyes   . Alcohol withdrawal seizure (HCC) 06/13/2014  . 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 ESOPHAGOGASTRODUODENOSCOPY N/A 09/18/2018   Procedure: ESOPHAGOGASTRODUODENOSCOPY (EGD);  Surgeon: Toney Reil, MD;  Location: [facility] ENDOSCOPY;  Service: Gastroenterology;  Laterality: N/A;  . TONSILLECTOMY      Allergies Patient has no known allergies.  Social History Social History   Tobacco Use  . Smoking status: Current Every Day Smoker    Packs/day: 1.00  . Smokeless tobacco: Never Used  Substance Use Topics  . Alcohol use: Yes    Comment: today "too much"  . Drug use: Not Currently   Review of Systems Constitutional: Negative for fever. Cardiovascular: Negative for chest  pain. Respiratory: Negative for shortness of breath. Gastrointestinal: Negative for abdominal pain, vomiting and diarrhea. Musculoskeletal: Negative for back pain. Skin: Positive for laceration, contusion Neurological: Negative for headaches, focal weakness or numbness.  All systems negative/normal/unremarkable except as stated in the HPI  ____________________________________________   PHYSICAL EXAM:  VITAL SIGNS: ED Triage Vitals  Enc Vitals Group     BP      Pulse      Resp      Temp      Temp src      SpO2      Weight      Height      Head Circumference      Peak Flow      Pain Score      Pain Loc      Pain Edu?      Excl. in GC?    Constitutional: Alert and oriented.  No acute distress Eyes: Conjunctivae are normal. Normal extraocular movements. ENT      Head: Normocephalic, right periorbital ecchymosis, right forehead contusion and laceration that has recently been treated with Steri-Strips      Nose: No congestion/rhinnorhea.      Mouth/Throat: Mucous membranes are moist.  Left anterior tongue laceration with mild bleeding      Neck: No stridor. Cardiovascular: Normal rate, regular rhythm. No murmurs, rubs, or gallops. Respiratory: Normal respiratory effort without tachypnea nor retractions. Breath sounds are clear and equal bilaterally. No wheezes/rales/rhonchi. Gastrointestinal: Soft and nontender. Normal bowel sounds Musculoskeletal: Nontender  with normal range of motion in extremities. No lower extremity tenderness nor edema. Neurologic:  Normal speech and language. No gross focal neurologic deficits are appreciated.  Skin: Scattered contusions are noted Psychiatric: Mood and affect are normal. Speech and behavior are normal.  ____________________________________________  EKG: Interpreted by me.  Sinus tachycardia rate of 104 bpm, borderline right axis deviation, normal QT  ____________________________________________  ED COURSE:  As part of my medical  decision making, I reviewed the following data within the electronic MEDICAL RECORD NUMBER History obtained from family if available, nursing notes, old chart and ekg, as well as notes from prior ED visits. Patient presented for likely alcohol withdrawal seizure, we will assess with labs and imaging as indicated at this time.   Procedures  Terry Clark was evaluated in Emergency Department on 05/26/2019 for the symptoms described in the history of present illness. He was evaluated in the context of the global COVID-19 pandemic, which necessitated consideration that the patient might be at risk for infection with the SARS-CoV-2 virus that causes COVID-19. Institutional protocols and algorithms that pertain to the evaluation of patients at risk for COVID-19 are in a state of rapid change based on information released by regulatory bodies including the CDC and federal and state organizations. These policies and algorithms were followed during the patient's care in the ED.  ____________________________________________   LABS (pertinent positives/negatives)  Labs Reviewed  CBC WITH DIFFERENTIAL/PLATELET - Abnormal; Notable for the following components:      Result Value   WBC 3.1 (*)    RBC 3.92 (*)    Hemoglobin 12.6 (*)    HCT 37.2 (*)    Platelets 82 (*)    All other components within normal limits  COMPREHENSIVE METABOLIC PANEL - Abnormal; Notable for the following components:   Sodium 133 (*)    Potassium 3.4 (*)    Chloride 97 (*)    CO2 18 (*)    Glucose, Bld 176 (*)    AST 206 (*)    ALT 135 (*)    Total Bilirubin 1.5 (*)    Anion gap 18 (*)    All other components within normal limits  SARS CORONAVIRUS 2 (TAT 6-24 HRS)  ETHANOL  URINALYSIS, COMPLETE (UACMP) WITH MICROSCOPIC  URINE DRUG SCREEN, QUALITATIVE ([facility] ONLY)  TROPONIN I (HIGH SENSITIVITY)   CRITICAL CARE Performed by: Ulice DashJohnathan E Quentin Shorey   Total critical care time: 30 minutes  Critical care time was exclusive of  separately billable procedures and treating other patients.  Critical care was necessary to treat or prevent imminent or life-threatening deterioration.  Critical care was time spent personally by me on the following activities: development of treatment plan with patient and/or surrogate as well as nursing, discussions with consultants, evaluation of patient's response to treatment, examination of patient, obtaining history from patient or surrogate, ordering and performing treatments and interventions, ordering and review of laboratory studies, ordering and review of radiographic studies, pulse oximetry and re-evaluation of patient's condition.  RADIOLOGY  CT head IMPRESSION:  No acute intracranial abnormality.  ____________________________________________   DIFFERENTIAL DIAGNOSIS   Withdrawal seizure, epilepsy, intoxication, subdural, coagulopathy  FINAL ASSESSMENT AND PLAN  Alcohol withdrawal seizures   Plan: The patient had presented for a likely alcohol withdrawal seizure. Patient's labs are grossly unremarkable, LFT elevations are expected. Patient's imaging was negative for any intracranial process.  He has had multiple alcohol withdrawal seizures and has been placed on CIWA protocol.  He received oral and IV Ativan here.  It is unclear as to whether or not antiepileptics would be of any benefit for him.  He was just here 2 days ago for similar.  I will discuss with the hospitalist for admission.   Laurence Aly, MD    Note: This note was generated in part or whole with voice recognition software. Voice recognition is usually quite accurate but there are transcription errors that can and very often do occur. I apologize for any typographical errors that were not detected and corrected.     Earleen Newport, MD 05/26/19 [phone]

## 2019-05-26 NOTE — ED Triage Notes (Signed)
Pt arrived via ems from home for report of seizure activity per girlfriend - she reports that pt had seizure yesterday and was seen in the ED and then had 3 seizures this am - pt had fall r/t seizures and has noted black eyes - pt is requesting ativan

## 2019-05-26 NOTE — Progress Notes (Signed)
Per patient has had 4 loose bms, pt states " he has had diarrhea for two weeks and wife and kids also have it". Discussed with Levada Dy, RN. Dr. Vianne Bulls notified and received verbal orders to check for c-diff. Will put orders in.

## 2019-05-26 NOTE — ED Notes (Signed)
Pt reports drinking a case of beer and a fifth of liquor daily - he last drank 2 days ago - reports seizure activity is related to withdrawal and hx of same previously

## 2019-05-26 NOTE — H&P (Signed)
[facility] [place] at [facility]   PATIENT NAME: Terry Clark    MR#:  [id number]  DATE OF BIRTH:  Mar 20, 1971  DATE OF ADMISSION:  05/26/2019  PRIMARY CARE PHYSICIAN: Patient, [name]   REQUESTING/REFERRING PHYSICIAN: Dr. Theressa MillardJonathan William  CHIEF COMPLAINT:   Chief Complaint  Patient presents with  . Seizures  . Fall    HISTORY OF PRESENT ILLNESS:  Terry FusiJohnathan Clark  is a 48 y.o. male with a known history of alcohol abuse, drinks a case of beer and fifth of liquor every day comes in because of seizure, brought by EMS patient's girlfriend noticed he is seizing.  Last drink was 2 days ago.  Received Ativan in the emergency room, now sedated.Marland Kitchen.  COVID-19 test is pending.  PAST MEDICAL HISTORY:   Past Medical History:  Diagnosis Date  . Alcohol abuse   . Alcohol withdrawal seizure (HCC)     PAST SURGICAL HISTOIRY:   Past Surgical History:  Procedure Laterality Date  . ESOPHAGOGASTRODUODENOSCOPY N/A 09/18/2018   Procedure: ESOPHAGOGASTRODUODENOSCOPY (EGD);  Surgeon: Toney ReilVanga, Rohini Reddy, MD;  [facility];  Service: Gastroenterology;  Laterality: N/A;  . TONSILLECTOMY      SOCIAL HISTORY:   Social History   Tobacco Use  . Smoking status: Current Every Day Smoker    Packs/day: 1.00  . Smokeless tobacco: Never Used  Substance Use Topics  . Alcohol use: Yes    Comment: case of beer and fifth of liquor daily    FAMILY HISTORY:   Family History  Problem Relation Age of Onset  . Kidney failure Mother   . Cancer Other     DRUG ALLERGIES:  No Known Allergies  REVIEW OF SYSTEMS:  unable to obtain review of systems because of sedation MEDICATIONS AT HOME:   Prior to Admission medications   Not on File      VITAL SIGNS:  Blood pressure (!) 104/91, pulse (!) 111, temperature 98.2 F (36.8 C), temperature source Oral, resp. rate 16, height 5\' 7"  (1.702 m), weight 68 kg, SpO2 99 %.  PHYSICAL EXAMINATION:  GENERAL:  48  y.o.-year-old patient lying in the bed with no acute distress.  EYES: Pupils equal, round, reactive to light  No scleral icterus. Extraocular muscles intact.  HEENT: Head atraumatic, normocephalic. Oropharynx and nasopharynx clear.  NECK:  Supple, no jugular venous distention. No thyroid enlargement, no tenderness.  LUNGS: Normal breath sounds bilaterally, no wheezing, rales,rhonchi or crepitation. No use of accessory muscles of respiration.  CARDIOVASCULAR: S1, S2 normal. No murmurs, rubs, or gallops.  ABDOMEN: Soft, nontender, nondistended. Bowel sounds present. No organomegaly or mass.  EXTREMITIES: No pedal edema, cyanosis, or clubbing.  NEUROLOGIC: Patient is sedated, no gross neurological deficit is observed.  PSYCHIATRIC: patient is sedated.  SKIN: No obvious rash, lesion, or ulcer.   LABORATORY PANEL:   CBC Recent Labs  Lab 05/26/19 0808  WBC 3.1*  HGB 12.6*  HCT 37.2*  PLT 82*   ------------------------------------------------------------------------------------------------------------------  Chemistries  Recent Labs  Lab 05/26/19 0808  NA 133*  K 3.4*  CL 97*  CO2 18*  GLUCOSE 176*  BUN 11  CREATININE 1.02  CALCIUM 9.0  MG 2.2  AST 206*  ALT 135*  ALKPHOS 60  BILITOT 1.5*   ------------------------------------------------------------------------------------------------------------------  Cardiac Enzymes No results for input(s): TROPONINI in the last 168 hours. ------------------------------------------------------------------------------------------------------------------  RADIOLOGY:  Ct Head Wo Contrast  Result Date: 05/26/2019 CLINICAL DATA:  Altered level of consciousness EXAM: CT HEAD WITHOUT CONTRAST  TECHNIQUE: Contiguous axial images were obtained from the base of the skull through the vertex without intravenous contrast. COMPARISON:  05/25/2019 FINDINGS: Brain: No acute intracranial abnormality. Specifically, no hemorrhage, hydrocephalus, mass  lesion, acute infarction, or significant intracranial injury. Vascular: No hyperdense vessel or unexpected calcification. Skull: No acute calvarial abnormality. Sinuses/Orbits: Visualized paranasal sinuses and mastoids clear. Orbital soft tissues unremarkable. Other: None IMPRESSION: No acute intracranial abnormality. Electronically Signed   By: Charlett Nose M.D.   On: 05/26/2019 08:21   Ct Head Wo Contrast  Result Date: 05/25/2019 CLINICAL DATA:  Head trauma. Seizure with head strike. Right supraorbital laceration. EXAM: CT HEAD WITHOUT CONTRAST CT MAXILLOFACIAL WITHOUT CONTRAST CT CERVICAL SPINE WITHOUT CONTRAST TECHNIQUE: Multidetector CT imaging of the head, cervical spine, and maxillofacial structures were performed using the standard protocol without intravenous contrast. Multiplanar CT image reconstructions of the cervical spine and maxillofacial structures were also generated. COMPARISON:  Most recent head CT February 26, 2019 FINDINGS: CT HEAD FINDINGS Brain: No evidence of acute infarction, hemorrhage, hydrocephalus, extra-axial collection or mass lesion/mass effect. Symmetric prominence of the ventricles, cisterns and sulci compatible with somewhat age advanced parenchymal volume loss Vascular: Atherosclerotic calcification of the carotid siphons. No hyperdense vessel. Right dominance of the dural venous drainage. Skull: Right frontal scalp and supraorbital soft tissue swelling and laceration with a small hematoma measuring up to 8 mm in maximal thickness. No subjacent calvarial fracture Other: None. CT MAXILLOFACIAL FINDINGS Osseous: No fracture of the bony orbits. Remote appearing fracture of the left nasal bone. Nasal bone and anterior maxillary processes are otherwise intact. No acute mid face fractures are seen. The pterygoid plates are intact. The mandible is intact. Temporomandibular joints are normally aligned. No temporal bone fractures are identified. Complete absence of the maxillary dentition.  Mandibular prognathism is noted. Partial absence of the mandibular dentition with a periapical lucency of the right mandibular first premolar, tooth 5. Orbits: The globes appear normal and symmetric. Symmetric appearance of the extraocular musculature and optic nerve sheath complexes. Normal caliber of the superior ophthalmic veins. Sinuses: Mild mural thickening of the maxillary sinuses. Remainder of the paranasal sinuses are predominantly clear. Middle ear cavities are clear. External auditory canals are patent. Soft tissues: Right periorbital soft tissue swelling and laceration, as detailed above. Minimal palpebral swelling. Small amount of soft tissue gas associated with laceration. No other gas or foreign body is identified. CT CERVICAL SPINE FINDINGS Alignment: Likely degenerative reversal of the normal cervical lordosis centered at C5-6. No traumatic listhesis. No abnormal facet widening. Normal alignment of the craniocervical and atlantoaxial articulations. Skull base and vertebrae: No acute fracture. No primary bone lesion or focal pathologic process. Soft tissues and spinal canal: No prevertebral fluid or swelling. No visible canal hematoma. Disc levels: Multilevel intervertebral disc height loss with spondylitic endplate changes. Maximal features at C5-6 with a disc osteophyte complex this level resulting in at least mild spinal canal stenosis. Uncinate spurring and facet hypertrophic changes result in moderate bilateral foraminal narrowing at C5-6 and C6-7. No severe canal or foraminal stenosis is identified. Upper chest: No acute abnormality in the upper chest or imaged lung apices. Other: None. IMPRESSION: 1. No acute intracranial abnormality. Age advanced parenchymal volume loss. 2. Right frontal scalp and supraorbital soft tissue swelling and laceration with a small hematoma measuring up to 8 mm in maximal thickness. 3. No subjacent calvarial or facial bone fracture, no visible globe injury. 4.  Remote left nasal bone fracture. 5. No acute cervical spine fracture. Multilevel degenerative  changes of the cervical spine as described above. 6. 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 Electronically Signed   By: Kreg ShropshirePrice  DeHay M.D.   On: 05/25/2019 01:09   Ct Cervical Spine Wo Contrast  Result Date: 05/25/2019 CLINICAL DATA:  Head trauma. Seizure with head strike. Right supraorbital laceration. EXAM: CT HEAD WITHOUT CONTRAST CT MAXILLOFACIAL WITHOUT CONTRAST CT CERVICAL SPINE WITHOUT CONTRAST TECHNIQUE: Multidetector CT imaging of the head, cervical spine, and maxillofacial structures were performed using the standard protocol without intravenous contrast. Multiplanar CT image reconstructions of the cervical spine and maxillofacial structures were also generated. COMPARISON:  Most recent head CT February 26, 2019 FINDINGS: CT HEAD FINDINGS Brain: No evidence of acute infarction, hemorrhage, hydrocephalus, extra-axial collection or mass lesion/mass effect. Symmetric prominence of the ventricles, cisterns and sulci compatible with somewhat age advanced parenchymal volume loss Vascular: Atherosclerotic calcification of the carotid siphons. No hyperdense vessel. Right dominance of the dural venous drainage. Skull: Right frontal scalp and supraorbital soft tissue swelling and laceration with a small hematoma measuring up to 8 mm in maximal thickness. No subjacent calvarial fracture Other: None. CT MAXILLOFACIAL FINDINGS Osseous: No fracture of the bony orbits. Remote appearing fracture of the left nasal bone. Nasal bone and anterior maxillary processes are otherwise intact. No acute mid face fractures are seen. The pterygoid plates are intact. The mandible is intact. Temporomandibular joints are normally aligned. No temporal bone fractures are identified. Complete absence of the maxillary dentition. Mandibular prognathism is noted. Partial absence of the mandibular dentition with a  periapical lucency of the right mandibular first premolar, tooth 5. Orbits: The globes appear normal and symmetric. Symmetric appearance of the extraocular musculature and optic nerve sheath complexes. Normal caliber of the superior ophthalmic veins. Sinuses: Mild mural thickening of the maxillary sinuses. Remainder of the paranasal sinuses are predominantly clear. Middle ear cavities are clear. External auditory canals are patent. Soft tissues: Right periorbital soft tissue swelling and laceration, as detailed above. Minimal palpebral swelling. Small amount of soft tissue gas associated with laceration. No other gas or foreign body is identified. CT CERVICAL SPINE FINDINGS Alignment: Likely degenerative reversal of the normal cervical lordosis centered at C5-6. No traumatic listhesis. No abnormal facet widening. Normal alignment of the craniocervical and atlantoaxial articulations. Skull base and vertebrae: No acute fracture. No primary bone lesion or focal pathologic process. Soft tissues and spinal canal: No prevertebral fluid or swelling. No visible canal hematoma. Disc levels: Multilevel intervertebral disc height loss with spondylitic endplate changes. Maximal features at C5-6 with a disc osteophyte complex this level resulting in at least mild spinal canal stenosis. Uncinate spurring and facet hypertrophic changes result in moderate bilateral foraminal narrowing at C5-6 and C6-7. No severe canal or foraminal stenosis is identified. Upper chest: No acute abnormality in the upper chest or imaged lung apices. Other: None. IMPRESSION: 1. No acute intracranial abnormality. Age advanced parenchymal volume loss. 2. Right frontal scalp and supraorbital soft tissue swelling and laceration with a small hematoma measuring up to 8 mm in maximal thickness. 3. No subjacent calvarial or facial bone fracture, no visible globe injury. 4. Remote left nasal bone fracture. 5. No acute cervical spine fracture. Multilevel  degenerative changes of the cervical spine as described above. 6. 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 Electronically Signed   By: Kreg ShropshirePrice  DeHay M.D.   On: 05/25/2019 01:09   Ct Maxillofacial Wo Contrast  Result Date: 05/25/2019 CLINICAL DATA:  Head trauma. Seizure with  head strike. Right supraorbital laceration. EXAM: CT HEAD WITHOUT CONTRAST CT MAXILLOFACIAL WITHOUT CONTRAST CT CERVICAL SPINE WITHOUT CONTRAST TECHNIQUE: Multidetector CT imaging of the head, cervical spine, and maxillofacial structures were performed using the standard protocol without intravenous contrast. Multiplanar CT image reconstructions of the cervical spine and maxillofacial structures were also generated. COMPARISON:  Most recent head CT February 26, 2019 FINDINGS: CT HEAD FINDINGS Brain: No evidence of acute infarction, hemorrhage, hydrocephalus, extra-axial collection or mass lesion/mass effect. Symmetric prominence of the ventricles, cisterns and sulci compatible with somewhat age advanced parenchymal volume loss Vascular: Atherosclerotic calcification of the carotid siphons. No hyperdense vessel. Right dominance of the dural venous drainage. Skull: Right frontal scalp and supraorbital soft tissue swelling and laceration with a small hematoma measuring up to 8 mm in maximal thickness. No subjacent calvarial fracture Other: None. CT MAXILLOFACIAL FINDINGS Osseous: No fracture of the bony orbits. Remote appearing fracture of the left nasal bone. Nasal bone and anterior maxillary processes are otherwise intact. No acute mid face fractures are seen. The pterygoid plates are intact. The mandible is intact. Temporomandibular joints are normally aligned. No temporal bone fractures are identified. Complete absence of the maxillary dentition. Mandibular prognathism is noted. Partial absence of the mandibular dentition with a periapical lucency of the right mandibular first premolar, tooth 5. Orbits: The  globes appear normal and symmetric. Symmetric appearance of the extraocular musculature and optic nerve sheath complexes. Normal caliber of the superior ophthalmic veins. Sinuses: Mild mural thickening of the maxillary sinuses. Remainder of the paranasal sinuses are predominantly clear. Middle ear cavities are clear. External auditory canals are patent. Soft tissues: Right periorbital soft tissue swelling and laceration, as detailed above. Minimal palpebral swelling. Small amount of soft tissue gas associated with laceration. No other gas or foreign body is identified. CT CERVICAL SPINE FINDINGS Alignment: Likely degenerative reversal of the normal cervical lordosis centered at C5-6. No traumatic listhesis. No abnormal facet widening. Normal alignment of the craniocervical and atlantoaxial articulations. Skull base and vertebrae: No acute fracture. No primary bone lesion or focal pathologic process. Soft tissues and spinal canal: No prevertebral fluid or swelling. No visible canal hematoma. Disc levels: Multilevel intervertebral disc height loss with spondylitic endplate changes. Maximal features at C5-6 with a disc osteophyte complex this level resulting in at least mild spinal canal stenosis. Uncinate spurring and facet hypertrophic changes result in moderate bilateral foraminal narrowing at C5-6 and C6-7. No severe canal or foraminal stenosis is identified. Upper chest: No acute abnormality in the upper chest or imaged lung apices. Other: None. IMPRESSION: 1. No acute intracranial abnormality. Age advanced parenchymal volume loss. 2. Right frontal scalp and supraorbital soft tissue swelling and laceration with a small hematoma measuring up to 8 mm in maximal thickness. 3. No subjacent calvarial or facial bone fracture, no visible globe injury. 4. Remote left nasal bone fracture. 5. No acute cervical spine fracture. Multilevel degenerative changes of the cervical spine as described above. 6. Periodontal disease  and absence of the maxillary dentition, detailed above. Electronically Signed   By: Kreg Shropshire M.D.   On: 05/25/2019 01:09    EKG:   Orders placed or performed during the hospital encounter of 05/26/19  . ED EKG  . ED EKG  . EKG 12-Lead  . EKG 12-Lead    IMPRESSION AND PLAN:   Alcohol withdrawal seizures, CT head showed scalp hematoma but nothing major, continue Ativan, follow seizure precautions.  Patient had 3 seizures in the morning as per girlfriend.  Continue CIWA protocol, IV thiamine, folic acid. 2.  Hypokalemia, replace potassium. 3.  History of seizures, bruising around right eye, cut on the forehead, CT shows scalp laceration.  #4 alcohol hepatitis:  continue IV PPIs. 5.  History of alcohol intoxication, GI bleed, alcohol withdrawal seizures before close monitoring for withdrawal symptoms as last drink was 2 days ago and patient was a heavy drinker. Continue n.p.o. at this time, when patient becomes more alert we can start clear liquids with aspiration precautions, continue Ativan for seizures.  COVID-19 test is negative. All the records are reviewed and case discussed with ED provider. Management plans discussed with the patient, family and they are in agreement.  CODE STATUS: Full code  TOTAL TIME TAKING CARE OF THIS PATIENT: 55 minutes.    Epifanio Lesches M.D on 05/26/2019 at 1:15 PM  Between 7am to 6pm - Pager - [phone]  After 6pm go to [email or web address] - password [email or web address] [facility]  Office  [phone]  CC: Primary care physician; Patient, [name]  Note: This dictation was prepared with Dragon dictation along with smaller phrase technology. Any transcriptional errors that result from this process are unintentional.

## 2019-05-26 NOTE — Progress Notes (Signed)
Per Jonny Ruiz, NP, "no (isolation) precautions but monitor for new complaints of bilateral lower extremities weakness, camphylobacter has been known to trigger GBS.  Will continue to monitor.

## 2019-05-26 NOTE — Progress Notes (Signed)
Lab called this RN to report stool specimen results; neg for cdiff, but positive for camphylobacter and enteroaggregative ecoli. This was reported to E. Stark Klein, NP.

## 2019-05-27 LAB — CBC
HCT: 37.5 % — ABNORMAL LOW (ref 39.0–52.0)
Hemoglobin: 12.8 g/dL — ABNORMAL LOW (ref 13.0–17.0)
MCH: 31.9 pg (ref 26.0–34.0)
MCHC: 34.1 g/dL (ref 30.0–36.0)
MCV: 93.5 fL (ref 80.0–100.0)
Platelets: 71 10*3/uL — ABNORMAL LOW (ref 150–400)
RBC: 4.01 MIL/uL — ABNORMAL LOW (ref 4.22–5.81)
RDW: 14.6 % (ref 11.5–15.5)
WBC: 2.6 10*3/uL — ABNORMAL LOW (ref 4.0–10.5)
nRBC: 0 % (ref 0.0–0.2)

## 2019-05-27 LAB — BASIC METABOLIC PANEL
Anion gap: 11 (ref 5–15)
BUN: 6 mg/dL (ref 6–20)
CO2: 25 mmol/L (ref 22–32)
Calcium: 8.9 mg/dL (ref 8.9–10.3)
Chloride: 100 mmol/L (ref 98–111)
Creatinine, Ser: 0.75 mg/dL (ref 0.61–1.24)
GFR calc Af Amer: >60 mL/min (ref >60)
GFR calc non Af Amer: >60 mL/min (ref >60)
Glucose, Bld: 78 mg/dL (ref 70–99)
Potassium: 3.5 mmol/L (ref 3.5–5.1)
Sodium: 136 mmol/L (ref 135–145)

## 2019-05-27 LAB — MAGNESIUM: Magnesium: 2.3 mg/dL (ref 1.7–2.4)

## 2019-05-27 NOTE — Plan of Care (Signed)
  Problem: Education: Goal: Knowledge of General Education information will improve Description: Including pain rating scale, medication(s)/side effects and non-pharmacologic comfort measures Outcome: Progressing   Problem: Activity: Goal: Risk for activity intolerance will decrease Outcome: Progressing   Problem: Safety: Goal: Ability to remain free from injury will improve Outcome: Progressing Note: Patient complains of dizziness with position change.   Problem: Health Behavior/Discharge Planning: Goal: Ability to manage health-related needs will improve Outcome: Not Progressing    Patient is requesting ativan every hour for anxiety.

## 2019-05-27 NOTE — Progress Notes (Signed)
[facility] [place] at [facility]   PATIENT NAME: Terry Clark    MR#:  [id number]  DATE OF BIRTH:  Jul 19, 1971  SUBJECTIVE: Patient is alert, awake, oriented no seizure since admission, on Ativan, patient does have tremors and asking for Ativan.  Patient fell and suffered scalp hematoma secondary to seizure.  Right eye is swollen but denies any blurred vision.  CHIEF COMPLAINT:   Chief Complaint  Patient presents with  . Seizures  . Fall    REVIEW OF SYSTEMS:   ROS CONSTITUTIONAL: No fever, fatigue or weakness.  EYES: No blurred or double vision.  EARS, NOSE, AND THROAT: No tinnitus or ear pain.  RESPIRATORY: No cough, shortness of breath, wheezing or hemoptysis.  CARDIOVASCULAR: No chest pain, orthopnea, edema.  GASTROINTESTINAL: No nausea, vomiting, diarrhea or abdominal pain.  GENITOURINARY: No dysuria, hematuria.  ENDOCRINE: No polyuria, nocturia,  HEMATOLOGY: No anemia, easy bruising or bleeding SKIN: No rash or lesion. MUSCULOSKELETAL: No joint pain or arthritis.   NEUROLOGIC: No tingling, numbness, weakness.  Anxiety, tremors PSYCHIATRY: Anxious.  DRUG ALLERGIES:  No Known Allergies  VITALS:  Blood pressure (!) 154/98, pulse 75, temperature 98.2 F (36.8 C), temperature source Oral, resp. rate 18, height 5\' 7"  (1.702 m), weight 69 kg, SpO2 99 %.  PHYSICAL EXAMINATION:  GENERAL:  48 y.o.-year-old patient lying in the bed with no acute distress.  EYES: Pupils equal, round, reactive to light. No scleral icterus. Extraocular muscles intact.  Swollen right eye noted HEENT: Head atraumatic, normocephalic. Oropharynx and nasopharynx clear.  NECK:  Supple, no jugular venous distention. No thyroid enlargement, no tenderness.  LUNGS: Normal breath sounds bilaterally, no wheezing, rales,rhonchi or crepitation. No use of accessory muscles of respiration.  CARDIOVASCULAR: S1, S2 normal. No murmurs, rubs, or gallops.  ABDOMEN: Soft, nontender,  nondistended. Bowel sounds present. No organomegaly or mass.  EXTREMITIES: No pedal edema, cyanosis, or clubbing.  NEUROLOGIC: Cranial nerves II through XII are intact. Muscle strength 5/5 in all extremities. Sensation intact. Gait not checked.  PSYCHIATRIC: The patient is alert and oriented x 3.  SKIN: Patient has bruise around the right eye and also cut on the right side of the forehead. LABORATORY PANEL:   CBC Recent Labs  Lab 05/27/19 0517  WBC 2.6*  HGB 12.8*  HCT 37.5*  PLT 71*   ------------------------------------------------------------------------------------------------------------------  Chemistries  Recent Labs  Lab 05/26/19 0808 05/27/19 0517  NA 133* 136  K 3.4* 3.5  CL 97* 100  CO2 18* 25  GLUCOSE 176* 78  BUN 11 6  CREATININE 1.02 0.75  CALCIUM 9.0 8.9  MG 2.2  --   AST 206*  --   ALT 135*  --   ALKPHOS 60  --   BILITOT 1.5*  --    ------------------------------------------------------------------------------------------------------------------  Cardiac Enzymes No results for input(s): TROPONINI in the last 168 hours. ------------------------------------------------------------------------------------------------------------------  RADIOLOGY:  Ct Head Wo Contrast  Result Date: 05/26/2019 CLINICAL DATA:  Altered level of consciousness EXAM: CT HEAD WITHOUT CONTRAST TECHNIQUE: Contiguous axial images were obtained from the base of the skull through the vertex without intravenous contrast. COMPARISON:  05/25/2019 FINDINGS: Brain: No acute intracranial abnormality. Specifically, no hemorrhage, hydrocephalus, mass lesion, acute infarction, or significant intracranial injury. Vascular: No hyperdense vessel or unexpected calcification. Skull: No acute calvarial abnormality. Sinuses/Orbits: Visualized paranasal sinuses and mastoids clear. Orbital soft tissues unremarkable. Other: None IMPRESSION: No acute intracranial abnormality. Electronically Signed   By:  Charlett NoseKevin  Dover M.D.   On: 05/26/2019 08:21  EKG:   Orders placed or performed during the hospital encounter of 05/26/19  . ED EKG  . ED EKG  . EKG 12-Lead  . EKG 12-Lead    ASSESSMENT AND PLAN:  Alcohol withdrawal seizures, continue Ativan, seizure precautions, IV thiamine, folic acid.  Had history of alcohol withdrawal seizures before.  Patient was seen in the ER 2 days ago.  With similar problem.      2.  Heavy alcohol abuse, last drink was 2 days ago continue CIWA protocol with Ativan.  Patient denies any hallucinations.  At this time Ativan will take care of withdrawal symptoms.  Will watch closely on telemetry.  Patient is advised to quit drinking.  Alcoholic hepatitis, continue IV PPIs, IV fluids, start regular diet.  The patient is alert, awake, oriented.  Follow LFTs, will order tomorrow.  #4, episodes of sinus pauses noted on telemetry as per registered nurse documentation from last night but patient was asymptomatic, no bradycardia noted on vitals.  Will get another EKG. #5. 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  #6. chronic diarrhea, C. difficile testing is negative, can use Imodium if needed.  COVID-19 test is negative. #7 hypokalemia improved. 8.  Lactic acid elevation secondary to seizure. Injury, improved.  Creatinine trended down from 1.0-0.75. All the records are reviewed and case discussed with Care Management/Social Workerr. Management plans discussed with the patient, family and they are in agreement.  CODE STATUS: Full code  TOTAL TIME TAKING CARE OF THIS PATIENT: 38 minutes.   POSSIBLE D/C IN 1-2 DAYS, DEPENDING ON CLINICAL CONDITION.   Epifanio Lesches M.D on 05/27/2019 at 10:45 AM  Between 7am to 6pm - Pager - [phone]  After 6pm go to [email or web address] - password [email or web address] [facility]  Office  [phone]  CC: Primary care physician; Patient, [name]   Note: This dictation was prepared with Dragon dictation along  with smaller phrase technology. Any transcriptional errors that result from this process are unintentional.

## 2019-05-27 NOTE — Progress Notes (Signed)
Patient has had multiple pauses on the monitor. Patient is asymptomatic. Patient has been resting since resuming care at midnight. Notified NP [place], see new orders.

## 2019-05-28 LAB — GLUCOSE, CAPILLARY
Glucose-Capillary: 103 mg/dL — ABNORMAL HIGH (ref 70–99)
Glucose-Capillary: 93 mg/dL (ref 70–99)
Glucose-Capillary: 151 mg/dL — ABNORMAL HIGH (ref 70–99)

## 2019-05-28 LAB — COMPREHENSIVE METABOLIC PANEL
ALT: 152 U/L — ABNORMAL HIGH (ref 0–44)
AST: 219 U/L — ABNORMAL HIGH (ref 15–41)
Albumin: 3.3 g/dL — ABNORMAL LOW (ref 3.5–5.0)
Alkaline Phosphatase: 57 U/L (ref 38–126)
Anion gap: 9 (ref 5–15)
BUN: 8 mg/dL (ref 6–20)
CO2: 25 mmol/L (ref 22–32)
Calcium: 8.6 mg/dL — ABNORMAL LOW (ref 8.9–10.3)
Chloride: 104 mmol/L (ref 98–111)
Creatinine, Ser: 0.73 mg/dL (ref 0.61–1.24)
GFR calc Af Amer: >60 mL/min (ref >60)
GFR calc non Af Amer: >60 mL/min (ref >60)
Glucose, Bld: 104 mg/dL — ABNORMAL HIGH (ref 70–99)
Potassium: 3.3 mmol/L — ABNORMAL LOW (ref 3.5–5.1)
Sodium: 138 mmol/L (ref 135–145)
Total Bilirubin: 1.2 mg/dL (ref 0.3–1.2)
Total Protein: 6.5 g/dL (ref 6.5–8.1)

## 2019-05-28 MED ORDER — LORAZEPAM 2 MG/ML IJ SOLN
1.0000 mg | INTRAMUSCULAR | Status: DC | PRN
  Administered 2019-05-28 – 2019-06-01 (×4): 2 mg via INTRAVENOUS
  Filled 2019-05-28 (×4): qty 1

## 2019-05-28 MED ORDER — LORAZEPAM 2 MG/ML IJ SOLN
1.0000 mg | INTRAMUSCULAR | Status: DC | PRN
  Filled 2019-05-28: qty 1

## 2019-05-28 MED ORDER — SODIUM CHLORIDE 0.9% FLUSH
10.0000 mL | Freq: Two times a day (BID) | INTRAVENOUS | Status: DC
  Administered 2019-05-28 – 2019-06-02 (×10): 10 mL via INTRAVENOUS

## 2019-05-28 MED ORDER — POTASSIUM CHLORIDE CRYS ER 20 MEQ PO TBCR
40.0000 meq | EXTENDED_RELEASE_TABLET | ORAL | Status: AC
  Administered 2019-05-28 (×2): 40 meq via ORAL
  Filled 2019-05-28 (×2): qty 2

## 2019-05-28 MED ORDER — LORAZEPAM 1 MG PO TABS
1.0000 mg | ORAL_TABLET | ORAL | Status: DC | PRN
  Administered 2019-05-28 – 2019-06-02 (×20): 1 mg via ORAL
  Filled 2019-05-28 (×17): qty 1

## 2019-05-28 MED ORDER — LORAZEPAM 1 MG PO TABS
1.0000 mg | ORAL_TABLET | ORAL | Status: DC | PRN
  Filled 2019-05-28 (×3): qty 1

## 2019-05-28 MED ORDER — LOPERAMIDE HCL 2 MG PO CAPS
2.0000 mg | ORAL_CAPSULE | Freq: Four times a day (QID) | ORAL | Status: DC | PRN
  Administered 2019-05-28 – 2019-06-01 (×2): 2 mg via ORAL
  Filled 2019-05-28 (×2): qty 1

## 2019-05-28 NOTE — Plan of Care (Signed)
  Problem: Education: Goal: Knowledge of General Education information will improve Description: Including pain rating scale, medication(s)/side effects and non-pharmacologic comfort measures Outcome: Progressing   Problem: Clinical Measurements: Goal: Ability to maintain clinical measurements within normal limits will improve Outcome: Progressing Goal: Will remain free from infection Outcome: Progressing   Problem: Activity: Goal: Risk for activity intolerance will decrease Outcome: Progressing   Problem: Nutrition: Goal: Adequate nutrition will be maintained Outcome: Progressing   Problem: Education: Goal: Knowledge of disease or condition will improve Outcome: Progressing Goal: Understanding of discharge needs will improve Outcome: Progressing   Problem: Health Behavior/Discharge Planning: Goal: Ability to identify changes in lifestyle to reduce recurrence of condition will improve Outcome: Progressing   Problem: Safety: Goal: Ability to remain free from injury will improve Outcome: Progressing   Problem: Health Behavior/Discharge Planning: Goal: Ability to manage health-related needs will improve Outcome: Not Progressing

## 2019-05-28 NOTE — Progress Notes (Signed)
[facility] [place] at [facility]   PATIENT NAME: Terry FusiJohnathan Clark    MR#:  [id number]  DATE OF BIRTH:  [birth year]  Patient is asking for Ativan.  States that diarrhea also is not better yet.  No abdominal pain, eating well.  Scoring 14 on CIWA scale.  Episodes of sinus pauses noted but patient asymptomatic.  CHIEF COMPLAINT:   Chief Complaint  Patient presents with  . Seizures  . Fall    REVIEW OF SYSTEMS:   ROS CONSTITUTIONAL: No fever, fatigue or weakness.  EYES: No blurred or double vision.  EARS, NOSE, AND THROAT: No tinnitus or ear pain.  RESPIRATORY: No cough, shortness of breath, wheezing or hemoptysis.  CARDIOVASCULAR: No chest pain, orthopnea, edema.  GASTROINTESTINAL: No nausea, vomiting, diarrhea or abdominal pain.  GENITOURINARY: No dysuria, hematuria.  ENDOCRINE: No polyuria, nocturia,  HEMATOLOGY: No anemia, easy bruising or bleeding SKIN: No rash or lesion. MUSCULOSKELETAL: No joint pain or arthritis.   NEUROLOGIC: No tingling, numbness, weakness.  Anxiety, tremors PSYCHIATRY: Anxious.  DRUG ALLERGIES:  No Known Allergies  VITALS:  Blood pressure (!) 164/93, pulse (!) 56, temperature 98.4 F (36.9 C), temperature source Oral, resp. rate 18, height 5\' 7"  (1.702 m), weight 60.7 kg, SpO2 100 %.  PHYSICAL EXAMINATION:  GENERAL:  48 y.o.-year-old patient lying in the bed with no acute distress.  EYES: Pupils equal, round, reactive to light. No scleral icterus. Extraocular muscles intact.  Swollen right eye noted HEENT: Head atraumatic, normocephalic. Oropharynx and nasopharynx clear.  NECK:  Supple, no jugular venous distention. No thyroid enlargement, no tenderness.  LUNGS: Normal breath sounds bilaterally, no wheezing, rales,rhonchi or crepitation. No use of accessory muscles of respiration.  CARDIOVASCULAR: S1, S2 normal. No murmurs, rubs, or gallops.  ABDOMEN: Soft, nontender, nondistended. Bowel sounds present. No organomegaly  or mass.  EXTREMITIES: No pedal edema, cyanosis, or clubbing.  NEUROLOGIC: Cranial nerves II through XII are intact. Muscle strength 5/5 in all extremities. Sensation intact. Gait not checked.  PSYCHIATRIC: The patient is alert and oriented x 3.  SKIN: Patient has bruise around the right eye and also cut on the right side of the forehead. LABORATORY PANEL:   CBC Recent Labs  Lab 05/27/19 0517  WBC 2.6*  HGB 12.8*  HCT 37.5*  PLT 71*   ------------------------------------------------------------------------------------------------------------------  Chemistries  Recent Labs  Lab 05/27/19 0517 05/28/19 0509  NA 136 138  K 3.5 3.3*  CL 100 104  CO2 25 25  GLUCOSE 78 104*  BUN 6 8  CREATININE 0.75 0.73  CALCIUM 8.9 8.6*  MG 2.3  --   AST  --  219*  ALT  --  152*  ALKPHOS  --  57  BILITOT  --  1.2   ------------------------------------------------------------------------------------------------------------------  Cardiac Enzymes No results for input(s): TROPONINI in the last 168 hours. ------------------------------------------------------------------------------------------------------------------  RADIOLOGY:  No results found.  EKG:   Orders placed or performed during the hospital encounter of 05/26/19  . ED EKG  . ED EKG  . EKG 12-Lead  . EKG 12-Lead    ASSESSMENT AND PLAN:  Alcohol withdrawal seizures, continue Ativan, seizure precautions, IV thiamine, folic acid.  Had history of alcohol withdrawal seizures before.  Patient was seen in the ER 2 days ago.  With similar problem.  Patient has history of deep alcohol abuse, drinks case of beer along with liquor.    2.  Heavy alcohol abuse, last drink was 2 days ago continue CIWA protocol with Ativan.  Patient denies any hallucinations.  Continue Ativan.      Alcoholic hepatitis, continue IV PPIs, continue regular diet, use Imodium for diarrhea, stool for C. difficile is negative.  Patient LFTs are slightly  worse today.  Continue to watch.   #4, episodes of sinus pauses noted on telemetry as per registered nurse documentation from last night but patient was asymptomatic, and sleeping also seen by cardiology, did not recommend any further work-up.  Avoid AV nodal blocking agents.  Patient needs sleep study as an outpatient. Pauses or high-grade heart blocks.  #5. 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  #6. chronic diarrhea, C. difficile testing is negative, can use Imodium if needed.  COVID-19 test is negative. #7 hypokalemia improved.  Continue to replace. 8.  Lactic acid elevation secondary to seizure. Injury, improved.  Creatinine trended down from 1.0-0.75. All the records are reviewed and case discussed with Care Management/Social Workerr. Management plans discussed with the patient, family and they are in agreement.  CODE STATUS: Full code  TOTAL TIME TAKING CARE OF THIS PATIENT: 38 minutes.   POSSIBLE D/C IN 1-2 DAYS, DEPENDING ON CLINICAL CONDITION.   Epifanio Lesches M.D on 05/28/2019 at 1:47 PM  Between 7am to 6pm - Pager - [phone]  After 6pm go to [email or web address] - password [email or web address] [place] [facility]  Office  [phone]  CC: Primary care physician; Patient, [name]   Note: This dictation was prepared with Dragon dictation along with smaller phrase technology. Any transcriptional errors that result from this process are unintentional.

## 2019-05-28 NOTE — Progress Notes (Signed)
    Pt added to our list this AM for review of telemetry due to concern for sinus pauses.  Tele reviewed w/ Dr. Garen Lah. At 6:44am, he had intermittent wenkebach w/ associated brief pauses in the setting of dropped beats.  No prolonged pauses or higher grade heart block noted.  No indication for further cardiac evaluation.  In the setting of baseline bradycardia, would avoid AVN blocking agents.  Consider outpt sleep study to r/o OSA.    Vitals:   05/28/19 0516 05/28/19 0823  BP: (!) 139/96 (!) 164/93  Pulse: 66 (!) 56  Resp: 16 18  Temp: 98.1 F (36.7 C) 98.4 F (36.9 C)  SpO2: 100% 100%    Murray Hodgkins, NP

## 2019-05-28 NOTE — Plan of Care (Signed)
  Problem: Education: Goal: Knowledge of General Education information will improve Description: Including pain rating scale, medication(s)/side effects and non-pharmacologic comfort measures Outcome: Progressing   Problem: Health Behavior/Discharge Planning: Goal: Ability to manage health-related needs will improve Outcome: Not Progressing Note: Patient's CIWA score has risen from 12 to 15 this shift, despite IV Ativan. Will continue to monitor / treat withdrawal symptoms as directed. Wenda Low [facility]

## 2019-05-29 LAB — GLUCOSE, CAPILLARY: Glucose-Capillary: 96 mg/dL (ref 70–99)

## 2019-05-29 MED ORDER — AZITHROMYCIN 250 MG PO TABS
500.0000 mg | ORAL_TABLET | Freq: Every day | ORAL | Status: DC
  Administered 2019-05-29 – 2019-06-02 (×5): 500 mg via ORAL
  Filled 2019-05-29 (×5): qty 2

## 2019-05-29 NOTE — Progress Notes (Signed)
No changes to LOC or blood pressure.

## 2019-05-29 NOTE — Progress Notes (Signed)
Terry Clark at [place] NAME: Terry Clark    MR#:  [id number]  DATE OF BIRTH:  [birth year]   still scoring 8 on CIWA scale, has slight abdominal pain.  No other complaints.  CHIEF COMPLAINT:   Chief Complaint  Patient presents with  . Seizures  . Fall  States diarrhea still bothering but stool for C. difficile negative GI panel positive for E. coli and Campylobacter  REVIEW OF SYSTEMS:   ROS CONSTITUTIONAL: No fever, fatigue or weakness.  EYES: No blurred or double vision.  EARS, NOSE, AND THROAT: No tinnitus or ear pain.  RESPIRATORY: No cough, shortness of breath, wheezing or hemoptysis.  CARDIOVASCULAR: No chest pain, orthopnea, edema.  GASTROINTESTINAL: No nausea, vomiting, diarrhea or abdominal pain.  GENITOURINARY: No dysuria, hematuria.  ENDOCRINE: No polyuria, nocturia,  HEMATOLOGY: No anemia, easy bruising or bleeding SKIN: No rash or lesion. MUSCULOSKELETAL: No joint pain or arthritis.   NEUROLOGIC: No tingling, numbness, weakness.  Anxiety, tremors PSYCHIATRY: Anxious.  DRUG ALLERGIES:  No Known Allergies  VITALS:  Blood pressure 127/89, pulse 75, temperature 98.2 F (36.8 C), temperature source Oral, resp. rate 19, height 5\' 7"  (1.702 m), weight 60.7 kg, SpO2 100 %.  PHYSICAL EXAMINATION:  GENERAL:  48 y.o.-year-old patient lying in the bed with no acute distress.  EYES: Pupils equal, round, reactive to light. No scleral icterus. Extraocular muscles intact.  Swollen right eye noted HEENT: Head atraumatic, normocephalic. Oropharynx and nasopharynx clear.  NECK:  Supple, no jugular venous distention. No thyroid enlargement, no tenderness.  LUNGS: Normal breath sounds bilaterally, no wheezing, rales,rhonchi or crepitation. No use of accessory muscles of respiration.  CARDIOVASCULAR: S1, S2 normal. No murmurs, rubs, or gallops.  ABDOMEN: Soft, nontender, nondistended. Bowel sounds present. No organomegaly or  mass.  EXTREMITIES: No pedal edema, cyanosis, or clubbing.  NEUROLOGIC: Cranial nerves II through XII are intact. Muscle strength 5/5 in all extremities. Sensation intact. Gait not checked.  PSYCHIATRIC: The patient is alert and oriented x 3.  SKIN: Patient has bruise around the right eye and also cut on the right side of the forehead. LABORATORY PANEL:   CBC Recent Labs  Lab 05/27/19 0517  WBC 2.6*  HGB 12.8*  HCT 37.5*  PLT 71*   ------------------------------------------------------------------------------------------------------------------  Chemistries  Recent Labs  Lab 05/27/19 0517 05/28/19 0509  NA 136 138  K 3.5 3.3*  CL 100 104  CO2 25 25  GLUCOSE 78 104*  BUN 6 8  CREATININE 0.75 0.73  CALCIUM 8.9 8.6*  MG 2.3  --   AST  --  219*  ALT  --  152*  ALKPHOS  --  57  BILITOT  --  1.2   ------------------------------------------------------------------------------------------------------------------  Cardiac Enzymes No results for input(s): TROPONINI in the last 168 hours. ------------------------------------------------------------------------------------------------------------------  RADIOLOGY:  No results found.  EKG:   Orders placed or performed during the hospital encounter of 05/26/19  . ED EKG  . ED EKG  . EKG 12-Lead  . EKG 12-Lead    ASSESSMENT AND PLAN:  Alcohol withdrawal seizures, continue Ativan, seizure precautions, IV thiamine, folic acid.  Had history of alcohol withdrawal seizures before.  Patient was seen in the ER 2 days ago.  With similar problem.  Patient has history of deep alcohol abuse, drinks case of beer along with liquor.    2.  Heavy alcohol abuse, last drink was 2 days ago continue CIWA protocol with Ativan.  Patient denies any hallucinations.  Continue Ativan.      Alcoholic hepatitis, continue IV PPIs, continue regular diet, use Imodium for diarrhea, stool for C. difficile is negative.  Patient LFTs are slightly  worse today.  Continue to watch.   #4, episodes of sinus pauses noted on telemetry as per registered nurse documentation from last night but patient was asymptomatic, and sleeping also seen by cardiology, did not recommend any further work-up.  Avoid AV nodal blocking agents.  Patient needs sleep study as an outpatient. Pauses or high-grade heart blocks.  #5. 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  #6.  Campylobacter, E. coli diarrhea; will add azithromycin 500 mg daily for 3 days     #7 hypokalemia improved.  Continue to replace.   8.  Lactic acid elevation secondary to seizure.   #9. acute kidney injury,.  Likely ATN secondary to diarrhea, poor p.o. intake, seizure, improved with fluids.  Creatinine trended down from 1.0-0.75. All the records are reviewed and case discussed with Care Management/Social Workerr. Management plans discussed with the patient, family and they are in agreement.  CODE STATUS: Full code  TOTAL TIME TAKING CARE OF THIS PATIENT: 38 minutes.   POSSIBLE D/C IN 1-2 DAYS, DEPENDING ON CLINICAL CONDITION.   Katha HammingSnehalatha Lina Hitch M.D on 05/29/2019 at 12:33 PM  Between 7am to 6pm - Pager - [phone]  After 6pm go to [email or web address] - password [email or web address]  [facility]  Office  [phone]  CC: Primary care physician; Patient, [name]   Note: This dictation was prepared with Dragon dictation along with smaller phrase technology. Any transcriptional errors that result from this process are unintentional.

## 2019-05-30 LAB — GLUCOSE, CAPILLARY: Glucose-Capillary: 95 mg/dL (ref 70–99)

## 2019-05-30 NOTE — Progress Notes (Signed)
[place] at [place] NAME: Terry Clark    MR#:  [id number]  DATE OF BIRTH:  1971-02-18  Still has diarrhea but less today, no cough, shortness of breath.  CIWA score of 9 today  CHIEF COMPLAINT:   Chief Complaint  Patient presents with  . Seizures  . Fall  States diarrhea still bothering but stool for C. difficile negative GI panel positive for E. coli and Campylobacter  REVIEW OF SYSTEMS:   ROS CONSTITUTIONAL: No fever, fatigue or weakness.  EYES: No blurred or double vision.  EARS, NOSE, AND THROAT: No tinnitus or ear pain.  RESPIRATORY: No cough, shortness of breath, wheezing or hemoptysis.  CARDIOVASCULAR: No chest pain, orthopnea, edema.  GASTROINTESTINAL: Has diarrhea but no nausea or vomiting today GENITOURINARY: No dysuria, hematuria.  ENDOCRINE: No polyuria, nocturia,  HEMATOLOGY: No anemia, easy bruising or bleeding SKIN: No rash or lesion. MUSCULOSKELETAL: No joint pain or arthritis.   NEUROLOGIC: No tingling, numbness, weakness.  Anxiety, tremors PSYCHIATRY: Anxious.  DRUG ALLERGIES:  No Known Allergies  VITALS:  Blood pressure 125/80, pulse 67, temperature 98.3 F (36.8 C), resp. rate 19, height 5\' 7"  (1.702 m), weight 60.8 kg, SpO2 100 %.  PHYSICAL EXAMINATION:  GENERAL:  48 y.o.-year-old patient lying in the bed with no acute distress.  EYES: Pupils equal, round, reactive to light. No scleral icterus. Extraocular muscles intact.  Swollen right eye noted HEENT: Head atraumatic, normocephalic. Oropharynx and nasopharynx clear.  NECK:  Supple, no jugular venous distention. No thyroid enlargement, no tenderness.  LUNGS: Normal breath sounds bilaterally, no wheezing, rales,rhonchi or crepitation. No use of accessory muscles of respiration.  CARDIOVASCULAR: S1, S2 normal. No murmurs, rubs, or gallops.  ABDOMEN: Soft, nontender, nondistended. Bowel sounds present. No organomegaly or mass.  EXTREMITIES: No  pedal edema, cyanosis, or clubbing.  Has hand tremors, hot flashes NEUROLOGIC: Cranial nerves II through XII are intact. Muscle strength 5/5 in all extremities. Sensation intact. Gait not checked.  PSYCHIATRIC: The patient is alert and oriented x 3.  SKIN: Patient has bruise around the right eye and also cut on the right side of the forehead. LABORATORY PANEL:   CBC Recent Labs  Lab 05/27/19 0517  WBC 2.6*  HGB 12.8*  HCT 37.5*  PLT 71*   ------------------------------------------------------------------------------------------------------------------  Chemistries  Recent Labs  Lab 05/27/19 0517 05/28/19 0509  NA 136 138  K 3.5 3.3*  CL 100 104  CO2 25 25  GLUCOSE 78 104*  BUN 6 8  CREATININE 0.75 0.73  CALCIUM 8.9 8.6*  MG 2.3  --   AST  --  219*  ALT  --  152*  ALKPHOS  --  57  BILITOT  --  1.2   ------------------------------------------------------------------------------------------------------------------  Cardiac Enzymes No results for input(s): TROPONINI in the last 168 hours. ------------------------------------------------------------------------------------------------------------------  RADIOLOGY:  No results found.  EKG:   Orders placed or performed during the hospital encounter of 05/26/19  . ED EKG  . ED EKG  . EKG 12-Lead  . EKG 12-Lead    ASSESSMENT AND PLAN:  Alcohol withdrawal seizures, continue Ativan, seizure precautions, IV thiamine, folic acid.  Had history of alcohol withdrawal seizures before.  Patient was seen in the ER 2 days ago.  With similar problem.  Patient has history of deep alcohol abuse, drinks case of beer along with liquor.    2.  Heavy alcohol abuse, last drink was 2 days ago of admission, continue CIWA protocol with Ativan.  Patient denies any hallucinations.  Continue Ativan.  Still scoring 9 CIWA score.     Alcoholic hepatitis, continue IV PPIs, continue regular diet, use Imodium for diarrhea, stool for C.  difficile is negative.  Patient LFTs are slightly worse today.  Continue to watch.   #4, episodes of sinus pauses noted on telemetry seen by cardiology, no further cardiac intervention as patient did not return of sinus pause during the sleep, avoid AV nodal agents.  Patient did not have any further sinus pauses reported to me   #5. 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  #6.  Campylobacter, E. coli diarrhea; continue azithromycin for total 3 days    #7, hypokalemia improved.  Continue to replace.   8.  Lactic acid elevation secondary to seizure.   #9. acute kidney injury,.  Likely ATN secondary to diarrhea, poor p.o. intake, seizure, improved with fluids.  Creatinine trended down from 1.0-0.75.    Possible d/c  Over weekend All the records are reviewed and case discussed with Care Management/Social Workerr. Management plans discussed with the patient, family and they are in agreement.  CODE STATUS: Full code  TOTAL TIME TAKING CARE OF THIS PATIENT: 38 minutes. More than 50% time spent in counseling, coordination.  POSSIBLE D/C IN 1-2 DAYS, DEPENDING ON CLINICAL CONDITION.   Katha HammingSnehalatha Shawny Borkowski M.D on 05/30/2019 at 1:54 PM  Between 7am to 6pm - Pager - [phone]  After 6pm go to [email or web address] - password [email or web address]  [facility]  Office  [phone]  CC: Primary care physician; Patient, [name]   Note: This dictation was prepared with Dragon dictation along with smaller phrase technology. Any transcriptional errors that result from this process are unintentional.

## 2019-05-30 NOTE — Plan of Care (Signed)
  Problem: Health Behavior/Discharge Planning: Goal: Ability to identify changes in lifestyle to reduce recurrence of condition will improve Outcome: Progressing   

## 2019-05-31 LAB — GLUCOSE, CAPILLARY: Glucose-Capillary: 95 mg/dL (ref 70–99)

## 2019-05-31 MED ORDER — ATENOLOL 100 MG PO TABS
100.0000 mg | ORAL_TABLET | Freq: Every day | ORAL | Status: DC
  Administered 2019-05-31 – 2019-06-02 (×3): 100 mg via ORAL
  Filled 2019-05-31 (×3): qty 1

## 2019-05-31 NOTE — Progress Notes (Signed)
Pt asked tech to tell this nurse "its time for my ativan". On arrival to the pt's room, pt is under the covers in the dark with back to the door. Pt did not acknowledge me and appears to be asleep. Nurse phone number is on the whiteboard if pt needs to directly contact this Probation officer. Pt exhibits no noted signs of distress.

## 2019-05-31 NOTE — Progress Notes (Signed)
[place] at [place] NAME: Terry Clark    MR#:  [id number]  DATE OF BIRTH:  1971/06/07  Still has diarrhea but less today, no cough, shortness of breath.  CIWA score of 9 today  CHIEF COMPLAINT:   Chief Complaint  Patient presents with  . Seizures  . Fall   Pt still is tremulous  REVIEW OF SYSTEMS:   ROS CONSTITUTIONAL: No fever, fatigue or weakness.  EYES: No blurred or double vision.  EARS, NOSE, AND THROAT: No tinnitus or ear pain.  RESPIRATORY: No cough, shortness of breath, wheezing or hemoptysis.  CARDIOVASCULAR: No chest pain, orthopnea, edema.  GASTROINTESTINAL: Has diarrhea but no nausea or vomiting today GENITOURINARY: No dysuria, hematuria.  ENDOCRINE: No polyuria, nocturia,  HEMATOLOGY: No anemia, easy bruising or bleeding SKIN: No rash or lesion. MUSCULOSKELETAL: No joint pain or arthritis.   NEUROLOGIC: No tingling, numbness, weakness.  Anxiety, tremors PSYCHIATRY: Anxious.  DRUG ALLERGIES:  No Known Allergies  VITALS:  Blood pressure (!) 140/99, pulse 89, temperature 97.8 F (36.6 C), temperature source Oral, resp. rate 20, height 5\' 7"  (1.702 m), weight 64.1 kg, SpO2 100 %.  PHYSICAL EXAMINATION:  GENERAL:  48 y.o.-year-old patient lying in the bed with no acute distress.  EYES: Pupils equal, round, reactive to light. No scleral icterus. Extraocular muscles intact.  Swollen right eye noted HEENT: Head atraumatic, normocephalic. Oropharynx and nasopharynx clear.  NECK:  Supple, no jugular venous distention. No thyroid enlargement, no tenderness.  LUNGS: Normal breath sounds bilaterally, no wheezing, rales,rhonchi or crepitation. No use of accessory muscles of respiration.  CARDIOVASCULAR: S1, S2 normal. No murmurs, rubs, or gallops.  ABDOMEN: Soft, nontender, nondistended. Bowel sounds present. No organomegaly or mass.  EXTREMITIES: No pedal edema, cyanosis, or clubbing.  Has hand tremors, hot  flashes NEUROLOGIC: Cranial nerves II through XII are intact. Muscle strength 5/5 in all extremities. Sensation intact. Gait not checked.  PSYCHIATRIC: The patient is alert and oriented x 3.  SKIN: Patient has bruise around the right eye and also cut on the right side of the forehead. LABORATORY PANEL:   CBC Recent Labs  Lab 05/27/19 0517  WBC 2.6*  HGB 12.8*  HCT 37.5*  PLT 71*   ------------------------------------------------------------------------------------------------------------------  Chemistries  Recent Labs  Lab 05/27/19 0517 05/28/19 0509  NA 136 138  K 3.5 3.3*  CL 100 104  CO2 25 25  GLUCOSE 78 104*  BUN 6 8  CREATININE 0.75 0.73  CALCIUM 8.9 8.6*  MG 2.3  --   AST  --  219*  ALT  --  152*  ALKPHOS  --  57  BILITOT  --  1.2   ------------------------------------------------------------------------------------------------------------------  Cardiac Enzymes No results for input(s): TROPONINI in the last 168 hours. ------------------------------------------------------------------------------------------------------------------  RADIOLOGY:  No results found.  EKG:   Orders placed or performed during the hospital encounter of 05/26/19  . ED EKG  . ED EKG  . EKG 12-Lead  . EKG 12-Lead    ASSESSMENT AND PLAN:  1. Alcohol withdrawal seizures, continue Ativan, seizure precautions, IV thiamine, folic acid.  Had history of alcohol withdrawal seizures before.  No further seizures noted    2.  Alcoholic hepatitis, continue IV PPIs, continue regular diet, use Imodium for diarrhea, stool for C. difficile is negative.  Patient LFTs are slightly worse today.  Continue to watch.   3.  episodes of sinus pauses noted on telemetry seen by cardiology, no further cardiac intervention as  patient did not return of sinus pause during the sleep, avoid AV nodal agents.  Patient did not have any further sinus pauses reported to me   4. . pancytopenia secondary to  EtOH abuse.  5.  Campylobacter, E. coli diarrhea; continue azithromycin for total 3 days  6.  acute kidney injury,.  Likely ATN secondary to diarrhea, renal function now normal      CODE STATUS: Full code  TOTAL TIME TAKING CARE OF THIS PATIENT: 35minutes. More than 50% time spent in counseling, coordination.  POSSIBLE D/C IN 1-2 DAYS, DEPENDING ON CLINICAL CONDITION.   Auburn BilberryShreyang Myka Hitz M.D on 05/31/2019 at 1:31 PM  Between 7am to 6pm - Pager - [phone]  After 6pm go to [email or web address] - password [email or web address]  [facility]  Office  [phone]  CC: Primary care physician; Patient, [name]   Note: This dictation was prepared with Dragon dictation along with smaller phrase technology. Any transcriptional errors that result from this process are unintentional.

## 2019-05-31 NOTE — Progress Notes (Signed)
Patient reports that his girlfriend and visitors to his home all drink heavily. We talked about the need to be in an alcohol free environment

## 2019-06-01 LAB — BASIC METABOLIC PANEL
Anion gap: 7 (ref 5–15)
BUN: 19 mg/dL (ref 6–20)
CO2: 26 mmol/L (ref 22–32)
Calcium: 9.1 mg/dL (ref 8.9–10.3)
Chloride: 104 mmol/L (ref 98–111)
Creatinine, Ser: 0.78 mg/dL (ref 0.61–1.24)
GFR calc Af Amer: >60 mL/min (ref >60)
GFR calc non Af Amer: >60 mL/min (ref >60)
Glucose, Bld: 92 mg/dL (ref 70–99)
Potassium: 3.5 mmol/L (ref 3.5–5.1)
Sodium: 137 mmol/L (ref 135–145)

## 2019-06-01 MED ORDER — AMLODIPINE BESYLATE 10 MG PO TABS
10.0000 mg | ORAL_TABLET | Freq: Every day | ORAL | Status: DC
  Administered 2019-06-01 – 2019-06-02 (×2): 10 mg via ORAL
  Filled 2019-06-01 (×2): qty 1

## 2019-06-01 NOTE — Progress Notes (Signed)
[facility] - [place] [facility]   PATIENT NAME: Terry FusiJohnathan Daponte    MR#:  [id number]  DATE OF BIRTH:  November 25, 1970  Still has diarrhea but less today, no cough, shortness of breath.  CIWA score of 9 today  CHIEF COMPLAINT:   Chief Complaint  Patient presents with  . Seizures  . Fall   Pt still is tremulous blood pressure still elevated  REVIEW OF SYSTEMS:   ROS CONSTITUTIONAL: No fever, fatigue or weakness.  EYES: No blurred or double vision.  EARS, NOSE, AND THROAT: No tinnitus or ear pain.  RESPIRATORY: No cough, shortness of breath, wheezing or hemoptysis.  CARDIOVASCULAR: No chest pain, orthopnea, edema.  GASTROINTESTINAL: Has diarrhea but no nausea or vomiting today GENITOURINARY: No dysuria, hematuria.  ENDOCRINE: No polyuria, nocturia,  HEMATOLOGY: No anemia, easy bruising or bleeding SKIN: No rash or lesion. MUSCULOSKELETAL: No joint pain or arthritis.   NEUROLOGIC: No tingling, numbness, weakness.  Anxiety, tremors PSYCHIATRY: Anxious.  DRUG ALLERGIES:  No Known Allergies  VITALS:  Blood pressure (!) 136/98, pulse 61, temperature 98.4 F (36.9 C), temperature source Oral, resp. rate 18, height 5\' 7"  (1.702 m), weight 64.1 kg, SpO2 100 %.  PHYSICAL EXAMINATION:  GENERAL:  48 y.o.-year-old patient lying in the bed with no acute distress.  EYES: Pupils equal, round, reactive to light. No scleral icterus. Extraocular muscles intact.  Swollen right eye noted HEENT: Head atraumatic, normocephalic. Oropharynx and nasopharynx clear.  NECK:  Supple, no jugular venous distention. No thyroid enlargement, no tenderness.  LUNGS: Normal breath sounds bilaterally, no wheezing, rales,rhonchi or crepitation. No use of accessory muscles of respiration.  CARDIOVASCULAR: S1, S2 normal. No murmurs, rubs, or gallops.  ABDOMEN: Soft, nontender, nondistended. Bowel sounds present. No organomegaly or mass.  EXTREMITIES: No pedal edema, cyanosis, or clubbing.   Has hand tremors, hot flashes NEUROLOGIC: Cranial nerves II through XII are intact. Muscle strength 5/5 in all extremities. Sensation intact. Gait not checked.  PSYCHIATRIC: The patient is alert and oriented x 3.  SKIN: Patient has bruise around the right eye and also cut on the right side of the forehead. LABORATORY PANEL:   CBC Recent Labs  Lab 05/27/19 0517  WBC 2.6*  HGB 12.8*  HCT 37.5*  PLT 71*   ------------------------------------------------------------------------------------------------------------------  Chemistries  Recent Labs  Lab 05/27/19 0517 05/28/19 0509 06/01/19 0454  NA 136 138 137  K 3.5 3.3* 3.5  CL 100 104 104  CO2 25 25 26   GLUCOSE 78 104* 92  BUN 6 8 19   CREATININE 0.75 0.73 0.78  CALCIUM 8.9 8.6* 9.1  MG 2.3  --   --   AST  --  219*  --   ALT  --  152*  --   ALKPHOS  --  57  --   BILITOT  --  1.2  --    ------------------------------------------------------------------------------------------------------------------  Cardiac Enzymes No results for input(s): TROPONINI in the last 168 hours. ------------------------------------------------------------------------------------------------------------------  RADIOLOGY:  No results found.  EKG:   Orders placed or performed during the hospital encounter of 05/26/19  . ED EKG  . ED EKG  . EKG 12-Lead  . EKG 12-Lead    ASSESSMENT AND PLAN:  1. Alcohol withdrawal seizures, continue Ativan, seizure precautions, IV thiamine, folic acid.  Had history of alcohol withdrawal seizures before.  No further seizures noted, no need for antiepileptic    2.  Alcoholic hepatitis, continue IV PPIs, continue regular diet, use Imodium for diarrhea, stool for C.  difficile is negative.  Patient LFTs are slightly worse today.  Continue to watch.   3.  episodes of sinus pauses noted on telemetry seen by cardiology, no further cardiac intervention as patient did not return of sinus pause during the sleep,  avoid AV nodal agents.  Patient did not have any further sinus pauses reported to me   4. . 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  5.  Campylobacter, E. coli diarrhea; continue azithromycin for total 3 days  6.  acute kidney injury,.  Likely ATN secondary to diarrhea, renal function now normal    CODE STATUS: Full code  TOTAL TIME TAKING CARE OF THIS PATIENT: 102minutes. More than 50% time spent in counseling, coordination.  POSSIBLE D/C IN 1-2 DAYS, DEPENDING ON CLINICAL CONDITION.   Dustin Flock M.D on 06/01/2019 at 12:45 PM  Between 7am to 6pm - Pager - [phone]  After 6pm go to [email or web address] - password [email or web address] [facility]  Office  [phone]  CC: Primary care physician; Patient, [name]   Note: This dictation was prepared with Dragon dictation along with smaller phrase technology. Any transcriptional errors that result from this process are unintentional.

## 2019-06-01 NOTE — Plan of Care (Signed)
  Problem: Education: Goal: Knowledge of General Education information will improve Description: Including pain rating scale, medication(s)/side effects and non-pharmacologic comfort measures Outcome: Progressing   Problem: Health Behavior/Discharge Planning: Goal: Ability to manage health-related needs will improve Outcome: Not Progressing Note: Patient still exhibiting withdrawal symptoms today, on the 6th day of admission. CIWA scores have equalled 12 and 13, IV Ativan 2 mg. given both times. PO Imodium also administered for loose stools. Will continue to monitor overall condition. Wenda Low [facility]

## 2019-06-02 LAB — GLUCOSE, CAPILLARY: Glucose-Capillary: 93 mg/dL (ref 70–99)

## 2019-06-02 MED ORDER — FOLIC ACID 1 MG PO TABS: 1.0000 mg | ORAL_TABLET | Freq: Every day | ORAL | 0 refills | Status: AC

## 2019-06-02 MED ORDER — ADULT MULTIVITAMIN W/MINERALS CH: 1.0000 | ORAL_TABLET | Freq: Every day | ORAL | 0 refills | Status: AC

## 2019-06-02 MED ORDER — AMLODIPINE BESYLATE 10 MG PO TABS: 10.0000 mg | ORAL_TABLET | Freq: Every day | ORAL | 0 refills | Status: DC

## 2019-06-02 MED ORDER — DIAZEPAM 2 MG PO TABS: 2.0000 mg | ORAL_TABLET | Freq: Three times a day (TID) | ORAL | 0 refills | Status: DC | PRN

## 2019-06-02 MED ORDER — THIAMINE HCL 100 MG PO TABS: 100.0000 mg | ORAL_TABLET | Freq: Every day | ORAL | 0 refills | Status: AC

## 2019-06-02 NOTE — TOC Transition Note (Signed)
Transition of Care [facility]) - CM/SW Discharge Note   Patient Details  Name: PACER DORN MRN: [id number] Date of Birth: 1971/04/21  [facility] CM/SW Contact:  Ross Ludwig, LCSW Phone Number: 06/02/2019, 1:12 PM   Clinical Narrative:     CSW received consult that patient does not have any insurance and needs substance abuse resources.  CSW spoke to patient and provided him a list of substance abuse resources.  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  Patient was asked if patient needs assistance for medications, patient stated that his mom is part of a discount drug program, and working on helping him get set up with it.  CSW offered to provide medications from the medication management clinic, and patient stated he is going to try the discount program first.  Patient did not express any other needs or concerns.  CSW signing off    Final next level of care: Home/Self Care Barriers to Discharge: Barriers Resolved   Patient Goals and CMS Choice Patient states their goals for this hospitalization and ongoing recovery are:: To return back home. CMS Medicare.gov Compare Post Acute Care list provided to:: Patient Choice offered to / list presented to : Patient  Discharge Placement  Patient discharging back home.                     Discharge Plan and Services                DME Arranged: N/A         HH Arranged: NA          Social Determinants of Health (SDOH) Interventions     Readmission Risk Interventions No flowsheet data found.

## 2019-06-02 NOTE — Progress Notes (Signed)
Pt discharged via private car. Pt was supposed to call this RN when his ride got here so that he could be wheel downstairs but he didn't and just left on his own. This was discovered at 72. IV was removed and AVS was reviewed with the pt at 1300. All questions answered and pt verbalized understanding of D/C instructions. Pt was A&Ox4 and independent in the room.

## 2019-06-02 NOTE — Discharge Summary (Signed)
[facility] [place] [facility]  Latron T Urbieta, [place]48 y.o., DOB 23-Mar-1971, MRN [id number]. Admission date: 05/26/2019 Discharge Date 06/02/2019 Primary MD Patient, [name] Admitting Physician Katha HammingSnehalatha Konidena, MD  Admission Diagnosis  Perceptual disturbances and seizures concurrent with and due to alcohol withdrawal (HCC) [W09.811[F10.232, R56.9]  Discharge Diagnosis   Active Problems: Alcohol withdrawal seizure Alcoholic hepatitis Sinus pause Pancytopenia Diarrhea with Campylobacter and E. Coli Acute kidney injury     Hospital Course  Nicola GirtJohnathan Christella NoaSwann  is a 48 y.o. male with a known history of alcohol abuse, drinks a case of beer and fifth of liquor every day comes in because of seizure, brought by EMS patient's girlfriend noticed he is seizing.  Last drink was 2 days ago.  Received Ativan in the emergency room, now sedated.  Patient was admitted for alcohol withdrawal.  He had no further seizures.  He also was noted to have some pauses on telemetry he was seen by cardiology no further intervention was recommended.  His blood pressure was high therefore he was started on Norvasc.  He was strongly recommended not to drink.           Consults  None  Significant Tests:  See full reports for all details     Ct Head Wo Contrast  Result Date: 05/26/2019 CLINICAL DATA:  Altered level of consciousness EXAM: CT HEAD WITHOUT CONTRAST TECHNIQUE: Contiguous axial images were obtained from the base of the skull through the vertex without intravenous contrast. COMPARISON:  05/25/2019 FINDINGS: Brain: No acute intracranial abnormality. Specifically, no hemorrhage, hydrocephalus, mass lesion, acute infarction, or significant intracranial injury. Vascular: No hyperdense vessel or unexpected calcification. Skull: No acute calvarial abnormality. Sinuses/Orbits: Visualized paranasal sinuses and mastoids clear. Orbital soft tissues unremarkable. Other: None IMPRESSION: No acute  intracranial abnormality. Electronically Signed   By: Charlett NoseKevin  Dover M.D.   On: 05/26/2019 08:21   Ct Head Wo Contrast  Result Date: 05/25/2019 CLINICAL DATA:  Head trauma. Seizure with head strike. Right supraorbital laceration. EXAM: CT HEAD WITHOUT CONTRAST CT MAXILLOFACIAL WITHOUT CONTRAST CT CERVICAL SPINE WITHOUT CONTRAST TECHNIQUE: Multidetector CT imaging of the head, cervical spine, and maxillofacial structures were performed using the standard protocol without intravenous contrast. Multiplanar CT image reconstructions of the cervical spine and maxillofacial structures were also generated. COMPARISON:  Most recent head CT February 26, 2019 FINDINGS: CT HEAD FINDINGS Brain: No evidence of acute infarction, hemorrhage, hydrocephalus, extra-axial collection or mass lesion/mass effect. Symmetric prominence of the ventricles, cisterns and sulci compatible with somewhat age advanced parenchymal volume loss Vascular: Atherosclerotic calcification of the carotid siphons. No hyperdense vessel. Right dominance of the dural venous drainage. Skull: Right frontal scalp and supraorbital soft tissue swelling and laceration with a small hematoma measuring up to 8 mm in maximal thickness. No subjacent calvarial fracture Other: None. CT MAXILLOFACIAL FINDINGS Osseous: No fracture of the bony orbits. Remote appearing fracture of the left nasal bone. Nasal bone and anterior maxillary processes are otherwise intact. No acute mid face fractures are seen. The pterygoid plates are intact. The mandible is intact. Temporomandibular joints are normally aligned. No temporal bone fractures are identified. Complete absence of the maxillary dentition. Mandibular prognathism is noted. Partial absence of the mandibular dentition with a periapical lucency of the right mandibular first premolar, tooth 5. Orbits: The globes appear normal and symmetric. Symmetric appearance of the extraocular musculature and optic nerve sheath complexes. Normal  caliber of the superior ophthalmic veins. Sinuses: Mild mural thickening of the maxillary  sinuses. Remainder of the paranasal sinuses are predominantly clear. Middle ear cavities are clear. External auditory canals are patent. Soft tissues: Right periorbital soft tissue swelling and laceration, as detailed above. Minimal palpebral swelling. Small amount of soft tissue gas associated with laceration. No other gas or foreign body is identified. CT CERVICAL SPINE FINDINGS Alignment: Likely degenerative reversal of the normal cervical lordosis centered at C5-6. No traumatic listhesis. No abnormal facet widening. Normal alignment of the craniocervical and atlantoaxial articulations. Skull base and vertebrae: No acute fracture. No primary bone lesion or focal pathologic process. Soft tissues and spinal canal: No prevertebral fluid or swelling. No visible canal hematoma. Disc levels: Multilevel intervertebral disc height loss with spondylitic endplate changes. Maximal features at C5-6 with a disc osteophyte complex this level resulting in at least mild spinal canal stenosis. Uncinate spurring and facet hypertrophic changes result in moderate bilateral foraminal narrowing at C5-6 and C6-7. No severe canal or foraminal stenosis is identified. Upper chest: No acute abnormality in the upper chest or imaged lung apices. Other: None. IMPRESSION: 1. No acute intracranial abnormality. Age advanced parenchymal volume loss. 2. Right frontal scalp and supraorbital soft tissue swelling and laceration with a small hematoma measuring up to 8 mm in maximal thickness. 3. No subjacent calvarial or facial bone fracture, no visible globe injury. 4. Remote left nasal bone fracture. 5. No acute cervical spine fracture. Multilevel degenerative changes of the cervical spine as described above. 6. 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 Electronically Signed   By: Kreg ShropshirePrice  DeHay M.D.   On: 05/25/2019 01:09   Ct  Cervical Spine Wo Contrast  Result Date: 05/25/2019 CLINICAL DATA:  Head trauma. Seizure with head strike. Right supraorbital laceration. EXAM: CT HEAD WITHOUT CONTRAST CT MAXILLOFACIAL WITHOUT CONTRAST CT CERVICAL SPINE WITHOUT CONTRAST TECHNIQUE: Multidetector CT imaging of the head, cervical spine, and maxillofacial structures were performed using the standard protocol without intravenous contrast. Multiplanar CT image reconstructions of the cervical spine and maxillofacial structures were also generated. COMPARISON:  Most recent head CT February 26, 2019 FINDINGS: CT HEAD FINDINGS Brain: No evidence of acute infarction, hemorrhage, hydrocephalus, extra-axial collection or mass lesion/mass effect. Symmetric prominence of the ventricles, cisterns and sulci compatible with somewhat age advanced parenchymal volume loss Vascular: Atherosclerotic calcification of the carotid siphons. No hyperdense vessel. Right dominance of the dural venous drainage. Skull: Right frontal scalp and supraorbital soft tissue swelling and laceration with a small hematoma measuring up to 8 mm in maximal thickness. No subjacent calvarial fracture Other: None. CT MAXILLOFACIAL FINDINGS Osseous: No fracture of the bony orbits. Remote appearing fracture of the left nasal bone. Nasal bone and anterior maxillary processes are otherwise intact. No acute mid face fractures are seen. The pterygoid plates are intact. The mandible is intact. Temporomandibular joints are normally aligned. No temporal bone fractures are identified. Complete absence of the maxillary dentition. Mandibular prognathism is noted. Partial absence of the mandibular dentition with a periapical lucency of the right mandibular first premolar, tooth 5. Orbits: The globes appear normal and symmetric. Symmetric appearance of the extraocular musculature and optic nerve sheath complexes. Normal caliber of the superior ophthalmic veins. Sinuses: Mild mural thickening of the maxillary  sinuses. Remainder of the paranasal sinuses are predominantly clear. Middle ear cavities are clear. External auditory canals are patent. Soft tissues: Right periorbital soft tissue swelling and laceration, as detailed above. Minimal palpebral swelling. Small amount of soft tissue gas associated with laceration. No other gas or foreign body is  identified. CT CERVICAL SPINE FINDINGS Alignment: Likely degenerative reversal of the normal cervical lordosis centered at C5-6. No traumatic listhesis. No abnormal facet widening. Normal alignment of the craniocervical and atlantoaxial articulations. Skull base and vertebrae: No acute fracture. No primary bone lesion or focal pathologic process. Soft tissues and spinal canal: No prevertebral fluid or swelling. No visible canal hematoma. Disc levels: Multilevel intervertebral disc height loss with spondylitic endplate changes. Maximal features at C5-6 with a disc osteophyte complex this level resulting in at least mild spinal canal stenosis. Uncinate spurring and facet hypertrophic changes result in moderate bilateral foraminal narrowing at C5-6 and C6-7. No severe canal or foraminal stenosis is identified. Upper chest: No acute abnormality in the upper chest or imaged lung apices. Other: None. IMPRESSION: 1. No acute intracranial abnormality. Age advanced parenchymal volume loss. 2. Right frontal scalp and supraorbital soft tissue swelling and laceration with a small hematoma measuring up to 8 mm in maximal thickness. 3. No subjacent calvarial or facial bone fracture, no visible globe injury. 4. Remote left nasal bone fracture. 5. No acute cervical spine fracture. Multilevel degenerative changes of the cervical spine as described above. 6. 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 Electronically Signed   By: Lovena Le M.D.   On: 05/25/2019 01:09   Ct Maxillofacial Wo Contrast  Result Date: 05/25/2019 CLINICAL DATA:  Head trauma. Seizure  with head strike. Right supraorbital laceration. EXAM: CT HEAD WITHOUT CONTRAST CT MAXILLOFACIAL WITHOUT CONTRAST CT CERVICAL SPINE WITHOUT CONTRAST TECHNIQUE: Multidetector CT imaging of the head, cervical spine, and maxillofacial structures were performed using the standard protocol without intravenous contrast. Multiplanar CT image reconstructions of the cervical spine and maxillofacial structures were also generated. COMPARISON:  Most recent head CT February 26, 2019 FINDINGS: CT HEAD FINDINGS Brain: No evidence of acute infarction, hemorrhage, hydrocephalus, extra-axial collection or mass lesion/mass effect. Symmetric prominence of the ventricles, cisterns and sulci compatible with somewhat age advanced parenchymal volume loss Vascular: Atherosclerotic calcification of the carotid siphons. No hyperdense vessel. Right dominance of the dural venous drainage. Skull: Right frontal scalp and supraorbital soft tissue swelling and laceration with a small hematoma measuring up to 8 mm in maximal thickness. No subjacent calvarial fracture Other: None. CT MAXILLOFACIAL FINDINGS Osseous: No fracture of the bony orbits. Remote appearing fracture of the left nasal bone. Nasal bone and anterior maxillary processes are otherwise intact. No acute mid face fractures are seen. The pterygoid plates are intact. The mandible is intact. Temporomandibular joints are normally aligned. No temporal bone fractures are identified. Complete absence of the maxillary dentition. Mandibular prognathism is noted. Partial absence of the mandibular dentition with a periapical lucency of the right mandibular first premolar, tooth 5. Orbits: The globes appear normal and symmetric. Symmetric appearance of the extraocular musculature and optic nerve sheath complexes. Normal caliber of the superior ophthalmic veins. Sinuses: Mild mural thickening of the maxillary sinuses. Remainder of the paranasal sinuses are predominantly clear. Middle ear cavities are  clear. External auditory canals are patent. Soft tissues: Right periorbital soft tissue swelling and laceration, as detailed above. Minimal palpebral swelling. Small amount of soft tissue gas associated with laceration. No other gas or foreign body is identified. CT CERVICAL SPINE FINDINGS Alignment: Likely degenerative reversal of the normal cervical lordosis centered at C5-6. No traumatic listhesis. No abnormal facet widening. Normal alignment of the craniocervical and atlantoaxial articulations. Skull base and vertebrae: No acute fracture. No primary bone lesion or focal pathologic process. Soft tissues and spinal  canal: No prevertebral fluid or swelling. No visible canal hematoma. Disc levels: Multilevel intervertebral disc height loss with spondylitic endplate changes. Maximal features at C5-6 with a disc osteophyte complex this level resulting in at least mild spinal canal stenosis. Uncinate spurring and facet hypertrophic changes result in moderate bilateral foraminal narrowing at C5-6 and C6-7. No severe canal or foraminal stenosis is identified. Upper chest: No acute abnormality in the upper chest or imaged lung apices. Other: None. IMPRESSION: 1. No acute intracranial abnormality. Age advanced parenchymal volume loss. 2. Right frontal scalp and supraorbital soft tissue swelling and laceration with a small hematoma measuring up to 8 mm in maximal thickness. 3. No subjacent calvarial or facial bone fracture, no visible globe injury. 4. Remote left nasal bone fracture. 5. No acute cervical spine fracture. Multilevel degenerative changes of the cervical spine as described above. 6. 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 Electronically Signed   By: Kreg Shropshire M.D.   On: 05/25/2019 01:09       Today   Subjective:   Gunnar Fusi patient doing well denies any complaints Objective:   Blood pressure (!) 142/88, pulse (!) 57, temperature 98.4 F (36.9 C),  temperature source Oral, resp. rate 18, height 5\' 6"  (1.676 m), weight 62.5 kg, SpO2 100 %.  .  Intake/Output Summary (Last 24 hours) at 06/02/2019 1450 Last data filed at 06/02/2019 0929 Gross per 24 hour  Intake 10 ml  Output -  Net 10 ml    Exam VITAL SIGNS: Blood pressure (!) 142/88, pulse (!) 57, temperature 98.4 F (36.9 C), temperature source Oral, resp. rate 18, height 5\' 6"  (1.676 m), weight 62.5 kg, SpO2 100 %.  GENERAL:  48 y.o.-year-old patient lying in the bed with no acute distress.  EYES: Pupils equal, round, reactive to light and accommodation. No scleral icterus. Extraocular muscles intact.  HEENT: Head atraumatic, normocephalic. Oropharynx and nasopharynx clear.  NECK:  Supple, no jugular venous distention. No thyroid enlargement, no tenderness.  LUNGS: Normal breath sounds bilaterally, no wheezing, rales,rhonchi or crepitation. No use of accessory muscles of respiration.  CARDIOVASCULAR: S1, S2 normal. No murmurs, rubs, or gallops.  ABDOMEN: Soft, nontender, nondistended. Bowel sounds present. No organomegaly or mass.  EXTREMITIES: No pedal edema, cyanosis, or clubbing.  NEUROLOGIC: Cranial nerves II through XII are intact. Muscle strength 5/5 in all extremities. Sensation intact. Gait not checked.  PSYCHIATRIC: The patient is alert and oriented x 3.  SKIN: No obvious rash, lesion, or ulcer.   Data Review     CBC w Diff:  Lab Results  Component Value Date   WBC 2.6 (L) 05/27/2019   HGB 12.8 (L) 05/27/2019   HCT 37.5 (L) 05/27/2019   PLT 71 (L) 05/27/2019   LYMPHOPCT 27 05/26/2019   MONOPCT 14 05/26/2019   EOSPCT 2 05/26/2019   BASOPCT 1 05/26/2019   CMP:  Lab Results  Component Value Date   NA 137 06/01/2019   K 3.5 06/01/2019   CL 104 06/01/2019   CO2 26 06/01/2019   BUN 19 06/01/2019   CREATININE 0.78 06/01/2019   PROT 6.5 05/28/2019   ALBUMIN 3.3 (L) 05/28/2019   BILITOT 1.2 05/28/2019   ALKPHOS 57 05/28/2019   AST 219 (H) 05/28/2019   ALT  152 (H) 05/28/2019  .  Micro Results Recent Results (from the past 240 hour(s))  SARS Coronavirus 2 [facility] order, Performed in [facility] hospital lab)     Status: None   Collection Time:  05/26/19  8:51 AM  Result Value Ref Range Status   SARS Coronavirus 2 NEGATIVE NEGATIVE Final    Comment: (NOTE) If result is NEGATIVE SARS-CoV-2 target nucleic acids are NOT DETECTED. The SARS-CoV-2 RNA is generally detectable in upper and lower  respiratory specimens during the acute phase of infection. The lowest  concentration of SARS-CoV-2 viral copies this assay can detect is 250  copies / mL. A negative result does not preclude SARS-CoV-2 infection  and should not be used as the sole basis for treatment or other  patient management decisions.  A negative result may occur with  improper specimen collection / handling, submission of specimen other  than nasopharyngeal swab, presence of viral mutation(s) within the  areas targeted by this assay, and inadequate number of viral copies  (<250 copies / mL). A negative result must be combined with clinical  observations, patient history, and epidemiological information. If result is POSITIVE SARS-CoV-2 target nucleic acids are DETECTED. The SARS-CoV-2 RNA is generally detectable in upper and lower  respiratory specimens dur ing the acute phase of infection.  Positive  results are indicative of active infection with SARS-CoV-2.  Clinical  correlation with patient history and other diagnostic information is  necessary to determine patient infection status.  Positive results do  not rule out bacterial infection or co-infection with other viruses. If result is PRESUMPTIVE POSTIVE SARS-CoV-2 nucleic acids MAY BE PRESENT.   A presumptive positive result was obtained on the submitted specimen  and confirmed on repeat testing.  While 2019 novel coronavirus  (SARS-CoV-2) nucleic acids may be present in the submitted sample  additional confirmatory  testing may be necessary for epidemiological  and / or clinical management purposes  to differentiate between  SARS-CoV-2 and other Sarbecovirus currently known to infect humans.  If clinically indicated additional testing with an alternate test  methodology [phone]) is advised. The SARS-CoV-2 RNA is generally  detectable in upper and lower respiratory sp ecimens during the acute  phase of infection. The expected result is Negative. Fact Sheet for Patients:  BoilerBrush.com.cy Fact Sheet for Healthcare Providers: https://pope.com/ This test is not yet approved or cleared by the [place] FDA and has been authorized for detection and/or diagnosis of SARS-CoV-2 by FDA under an Emergency Use Authorization (EUA).  This EUA will remain in effect (meaning this test can be used) for the duration of the COVID-19 declaration under Section 564(b)(1) of the Act, 21 U.S.C. section 360bbb-3(b)(1), unless the authorization is terminated or revoked sooner. Performed at [facility], [address] [place], [place] [address]   C difficile quick scan w PCR reflex     Status: None   Collection Time: 05/26/19  5:52 PM   Specimen: STOOL  Result Value Ref Range Status   C Diff antigen NEGATIVE NEGATIVE Final   C Diff toxin NEGATIVE NEGATIVE Final   C Diff interpretation No C. difficile detected.  Final    Comment: Performed at [facility], [address] [place], [place] [address]  Gastrointestinal Panel by PCR , Stool     Status: Abnormal   Collection Time: 05/26/19  5:52 PM   Specimen: Stool  Result Value Ref Range Status   Campylobacter species DETECTED (A) NOT DETECTED Final    Comment: RESULT CALLED TO, READ BACK BY AND VERIFIED WITH: ANGELA LLOYD RN AT 2218 ON 05/26/2019 SNG    Plesimonas shigelloides NOT DETECTED NOT DETECTED Final   Salmonella species NOT DETECTED NOT DETECTED Final   Yersinia  enterocolitica NOT  DETECTED NOT DETECTED Final   Vibrio species NOT DETECTED NOT DETECTED Final   Vibrio cholerae NOT DETECTED NOT DETECTED Final   Enteroaggregative E coli (EAEC) DETECTED (A) NOT DETECTED Final    Comment: RESULT CALLED TO, READ BACK BY AND VERIFIED WITH: ANGELA LLOYD RN AT 2218 ON 05/26/2019 SNG    Enteropathogenic E coli (EPEC) NOT DETECTED NOT DETECTED Final   Enterotoxigenic E coli (ETEC) NOT DETECTED NOT DETECTED Final   Shiga like toxin producing E coli (STEC) NOT DETECTED NOT DETECTED Final   Shigella/Enteroinvasive E coli (EIEC) NOT DETECTED NOT DETECTED Final   Cryptosporidium NOT DETECTED NOT DETECTED Final   Cyclospora cayetanensis NOT DETECTED NOT DETECTED Final   Entamoeba histolytica NOT DETECTED NOT DETECTED Final   Giardia lamblia NOT DETECTED NOT DETECTED Final   Adenovirus F40/41 NOT DETECTED NOT DETECTED Final   Astrovirus NOT DETECTED NOT DETECTED Final   Norovirus GI/GII NOT DETECTED NOT DETECTED Final   Rotavirus A NOT DETECTED NOT DETECTED Final   Sapovirus (I, II, IV, and V) NOT DETECTED NOT DETECTED Final    Comment: Performed at [facility], [address], [place], [place] [address]        Code Status Orders  (From admission, onward)         Start     Ordered   05/26/19 0934  Full code  Continuous     05/26/19 0934        Code Status History    Date Active Date Inactive Code Status Order ID Comments User Context   02/26/2019 2058 03/03/2019 0013 Full Code [id number]  Shaune Pollack, MD Inpatient   09/22/2018 1914 09/23/2018 1216 Full Code [id number]  Arnaldo Natal, MD ED   09/17/2018 2023 09/18/2018 2221 Full Code [id number]  Ihor Austin, MD Inpatient   06/13/2014 1643 06/14/2014 1839 Full Code [id number]  Levie Heritage, DO Inpatient   Advance Care Planning Activity          Follow-up Information    [facility] Follow up in 1 week(s).   Specialty: Primary Care Why: as new patient  Contact information: [address]  [address] [place] [address] [phone]          Discharge Medications   Allergies as of 06/02/2019   No Known Allergies     Medication List    TAKE these medications   amLODipine 10 MG tablet Commonly known as: NORVASC Take 1 tablet (10 mg total) by mouth daily.   diazepam 2 MG tablet Commonly known as: Valium Take 1 tablet (2 mg total) by mouth every 8 (eight) hours as needed for anxiety (tremmors).   folic acid 1 MG tablet Commonly known as: FOLVITE Take 1 tablet (1 mg total) by mouth daily.   multivitamin with minerals Tabs tablet Take 1 tablet by mouth daily.   thiamine 100 MG tablet Take 1 tablet (100 mg total) by mouth daily.          Total Time in preparing paper work, data evaluation and todays exam - 35 minutes  Auburn Bilberry M.D on 06/02/2019 at 2:50 PM [employer] [facility]   Office  [phone]

## 2019-06-17 ENCOUNTER — Encounter: Payer: Self-pay | Admitting: Emergency Medicine

## 2019-06-17 ENCOUNTER — Other Ambulatory Visit: Payer: Self-pay

## 2019-06-17 DIAGNOSIS — K2920 Alcoholic gastritis without bleeding: Secondary | ICD-10-CM | POA: Insufficient documentation

## 2019-06-17 DIAGNOSIS — F17200 Nicotine dependence, unspecified, uncomplicated: Secondary | ICD-10-CM | POA: Insufficient documentation

## 2019-06-17 DIAGNOSIS — R251 Tremor, unspecified: Secondary | ICD-10-CM | POA: Insufficient documentation

## 2019-06-17 DIAGNOSIS — H1132 Conjunctival hemorrhage, left eye: Secondary | ICD-10-CM | POA: Insufficient documentation

## 2019-06-17 DIAGNOSIS — Y908 Blood alcohol level of 240 mg/100 ml or more: Secondary | ICD-10-CM | POA: Insufficient documentation

## 2019-06-17 DIAGNOSIS — Z7141 Alcohol abuse counseling and surveillance of alcoholic: Secondary | ICD-10-CM | POA: Insufficient documentation

## 2019-06-17 DIAGNOSIS — Z79899 Other long term (current) drug therapy: Secondary | ICD-10-CM | POA: Insufficient documentation

## 2019-06-17 DIAGNOSIS — F10920 Alcohol use, unspecified with intoxication, uncomplicated: Secondary | ICD-10-CM | POA: Insufficient documentation

## 2019-06-17 DIAGNOSIS — Z046 Encounter for general psychiatric examination, requested by authority: Secondary | ICD-10-CM | POA: Insufficient documentation

## 2019-06-17 DIAGNOSIS — R55 Syncope and collapse: Secondary | ICD-10-CM | POA: Insufficient documentation

## 2019-06-17 LAB — COMPREHENSIVE METABOLIC PANEL
ALT: 62 U/L — ABNORMAL HIGH (ref 0–44)
AST: 94 U/L — ABNORMAL HIGH (ref 15–41)
Albumin: 4.2 g/dL (ref 3.5–5.0)
Alkaline Phosphatase: 59 U/L (ref 38–126)
Anion gap: 16 — ABNORMAL HIGH (ref 5–15)
BUN: 14 mg/dL (ref 6–20)
CO2: 21 mmol/L — ABNORMAL LOW (ref 22–32)
Calcium: 8.9 mg/dL (ref 8.9–10.3)
Chloride: 97 mmol/L — ABNORMAL LOW (ref 98–111)
Creatinine, Ser: 0.76 mg/dL (ref 0.61–1.24)
GFR calc Af Amer: >60 mL/min (ref >60)
GFR calc non Af Amer: >60 mL/min (ref >60)
Glucose, Bld: 122 mg/dL — ABNORMAL HIGH (ref 70–99)
Potassium: 3.4 mmol/L — ABNORMAL LOW (ref 3.5–5.1)
Sodium: 134 mmol/L — ABNORMAL LOW (ref 135–145)
Total Bilirubin: 1.1 mg/dL (ref 0.3–1.2)
Total Protein: 8.3 g/dL — ABNORMAL HIGH (ref 6.5–8.1)

## 2019-06-17 LAB — LIPASE, BLOOD: Lipase: 50 U/L (ref 11–51)

## 2019-06-17 LAB — TROPONIN I (HIGH SENSITIVITY): Troponin I (High Sensitivity): 8 ng/L (ref <18)

## 2019-06-17 NOTE — ED Triage Notes (Signed)
Pt to triage via w/c with no distress noted, mask in place; Pt reports hx IBS; c/o "pain all over"; st "my stomach, my chest" since this morning accomp by dizziness

## 2019-06-18 ENCOUNTER — Emergency Department: Payer: Self-pay

## 2019-06-18 ENCOUNTER — Emergency Department
Admission: EM | Admit: 2019-06-18 | Discharge: 2019-06-18 | Disposition: A | Discharge status: Home / Self Care | Payer: Self-pay | Attending: Emergency Medicine | Admitting: Emergency Medicine

## 2019-06-18 DIAGNOSIS — R1013 Epigastric pain: Secondary | ICD-10-CM

## 2019-06-18 DIAGNOSIS — F10920 Alcohol use, unspecified with intoxication, uncomplicated: Secondary | ICD-10-CM

## 2019-06-18 DIAGNOSIS — K2920 Alcoholic gastritis without bleeding: Secondary | ICD-10-CM

## 2019-06-18 LAB — CBC WITH DIFFERENTIAL/PLATELET
Abs Immature Granulocytes: 0 10*3/uL (ref 0.00–0.07)
Basophils Absolute: 0.1 10*3/uL (ref 0.0–0.1)
Basophils Relative: 1 %
Eosinophils Absolute: 0.1 10*3/uL (ref 0.0–0.5)
Eosinophils Relative: 1 %
HCT: 42.1 % (ref 39.0–52.0)
Hemoglobin: 14.8 g/dL (ref 13.0–17.0)
Immature Granulocytes: 0 %
Lymphocytes Relative: 67 %
Lymphs Abs: 2.5 10*3/uL (ref 0.7–4.0)
MCH: 31.6 pg (ref 26.0–34.0)
MCHC: 35.2 g/dL (ref 30.0–36.0)
MCV: 90 fL (ref 80.0–100.0)
Monocytes Absolute: 0.7 10*3/uL (ref 0.1–1.0)
Monocytes Relative: 19 %
Neutro Abs: 0.4 10*3/uL — ABNORMAL LOW (ref 1.7–7.7)
Neutrophils Relative %: 12 %
Platelets: 229 10*3/uL (ref 150–400)
RBC: 4.68 MIL/uL (ref 4.22–5.81)
RDW: 14.4 % (ref 11.5–15.5)
WBC: 3.8 10*3/uL — ABNORMAL LOW (ref 4.0–10.5)
nRBC: 0 % (ref 0.0–0.2)

## 2019-06-18 LAB — TROPONIN I (HIGH SENSITIVITY): Troponin I (High Sensitivity): 8 ng/L (ref <18)

## 2019-06-18 LAB — ETHANOL: Alcohol, Ethyl (B): 280 mg/dL — ABNORMAL HIGH (ref <10)

## 2019-06-18 MED ORDER — CHLORDIAZEPOXIDE HCL 25 MG PO CAPS: ORAL_CAPSULE | ORAL | 0 refills | Status: DC

## 2019-06-18 MED ORDER — IOHEXOL 300 MG/ML  SOLN
100.0000 mL | Freq: Once | INTRAMUSCULAR | Status: AC | PRN
  Administered 2019-06-18: 04:00:00 100 mL via INTRAVENOUS

## 2019-06-18 MED ORDER — SODIUM CHLORIDE 0.9 % IV BOLUS
1000.0000 mL | Freq: Once | INTRAVENOUS | Status: AC
  Administered 2019-06-18: 1000 mL via INTRAVENOUS

## 2019-06-18 MED ORDER — FAMOTIDINE 20 MG PO TABS: 20.0000 mg | ORAL_TABLET | Freq: Two times a day (BID) | ORAL | 1 refills | Status: DC

## 2019-06-18 MED ORDER — THIAMINE HCL 100 MG/ML IJ SOLN
Freq: Once | INTRAVENOUS | Status: AC
  Administered 2019-06-18: 04:00:00 via INTRAVENOUS
  Filled 2019-06-18: qty 1000

## 2019-06-18 MED ORDER — THIAMINE HCL 100 MG/ML IJ SOLN
100.0000 mg | Freq: Every day | INTRAMUSCULAR | Status: DC
Start: 2019-06-18 — End: 2019-06-18

## 2019-06-18 MED ORDER — IOHEXOL 9 MG/ML PO SOLN: 500.0000 mL | ORAL | Status: AC

## 2019-06-18 MED ORDER — LORAZEPAM 2 MG/ML IJ SOLN
0.0000 mg | Freq: Four times a day (QID) | INTRAMUSCULAR | Status: DC
  Administered 2019-06-18: 03:00:00 1 mg via INTRAVENOUS
  Filled 2019-06-18: qty 1

## 2019-06-18 MED ORDER — LORAZEPAM 1 MG PO TABS
1.0000 mg | ORAL_TABLET | Freq: Once | ORAL | Status: AC
  Administered 2019-06-18: 09:00:00 1 mg via ORAL
  Filled 2019-06-18: qty 1

## 2019-06-18 MED ORDER — FAMOTIDINE IN NACL 20-0.9 MG/50ML-% IV SOLN
20.0000 mg | Freq: Once | INTRAVENOUS | Status: AC
Start: 2019-06-18 — End: 2019-06-18
  Administered 2019-06-18: 20 mg via INTRAVENOUS
  Filled 2019-06-18: qty 50

## 2019-06-18 MED ORDER — LORAZEPAM 1 MG PO TABS
1.0000 mg | ORAL_TABLET | Freq: Once | ORAL | Status: AC
  Administered 2019-06-18: 13:00:00 1 mg via ORAL
  Filled 2019-06-18: qty 1

## 2019-06-18 NOTE — ED Provider Notes (Signed)
Outpatient referral has been made for this patient.   Terry Newport, MD 06/18/19 [phone]

## 2019-06-18 NOTE — BH Assessment (Addendum)
TTS called [facility] ([phone]) and spoke to Logan who reports they currently do not have any beds available.  TTS spoke to patient regarding alcohol detox treatment. Pt was informed that [facility] did not have any beds available. Patient said he preferred not to wait for another detox program to offer him a bed.   Patient was informed about [facility] resources and provided with referral information. Patient expressed his understanding and preferred to try outpatient services.  This information was relayed to patient's nurse and EDP - Dr. Jimmye Norman

## 2019-06-18 NOTE — ED Provider Notes (Signed)
[facility] Emergency Department Provider Note   ____________________________________________   First MD Initiated Contact with Patient 06/18/19 0200     (approximate)  I have reviewed the triage vital signs and the nursing notes.   HISTORY  Chief Complaint Abdominal Pain and Chest Pain    HPI Terry Clark is a 48 y.o. male who presents to the ED from home with a chief complaint of generalized abdominal pain, chest pain, nausea and vomiting.  Patient is a daily heavy drinker, "what ever I can get my hands on" who was admitted for a week last month for alcohol withdrawal seizures.  States he intermittently has seizures, last one 4 days ago and how he got left sub-conjunctival hemorrhage.  Presents tonight for generalized abdominal and chest pain.  States he desires alcohol detox.  Denies fever, cough, shortness of breath, diarrhea.  Denies recent travel or trauma.       Past Medical History:  Diagnosis Date  . Alcohol abuse   . Alcohol withdrawal seizure [facility])     Patient Active Problem List   Diagnosis Date Noted  . 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 Alcohol withdrawal ([place]) 02/26/2019  . Hematemesis   . GI bleed 09/17/2018  . Alcoholic intoxication with complication ([place])   . Anger   . Subconjunctival hemorrhage of both eyes   . Alcohol withdrawal seizure ([place]) 06/13/2014  . Alcoholic hepatitis 16/06/9603    Past Surgical History:  Procedure Laterality Date  . ESOPHAGOGASTRODUODENOSCOPY N/A 09/18/2018   Procedure: ESOPHAGOGASTRODUODENOSCOPY (EGD);  Surgeon: Lin Landsman, MD;  Location: [facility] ENDOSCOPY;  Service: Gastroenterology;  Laterality: N/A;  . TONSILLECTOMY      Prior to Admission medications   Medication Sig Start Date End Date Taking? Authorizing Provider  amLODipine (NORVASC) 10 MG tablet Take 1 tablet (10 mg total) by mouth daily. 06/02/19   Dustin Flock, MD  diazepam (VALIUM) 2 MG tablet Take 1 tablet (2 mg total) by mouth every 8 (eight) hours as needed for anxiety (tremmors). 06/02/19 06/01/20  Dustin Flock, MD  folic acid (FOLVITE) 1 MG tablet Take 1 tablet (1 mg total) by mouth daily. 06/02/19   Dustin Flock, MD  Multiple Vitamin (MULTIVITAMIN WITH MINERALS) TABS tablet Take 1 tablet by mouth daily. 06/02/19   Dustin Flock, MD  thiamine 100 MG tablet Take 1 tablet (100 mg total) by mouth daily. 06/02/19   Dustin Flock, MD    Allergies Patient has no known allergies.  Family History  Problem Relation Age of Onset  . Kidney failure Mother   . Cancer Other     Social History Social History   Tobacco Use  . Smoking status: Current Every Day Smoker    Packs/day: 1.00  . Smokeless tobacco: Never Used  Substance Use Topics  . Alcohol use: Yes    Comment: case of beer and fifth of liquor daily  . Drug use: Not Currently    Review of Systems  Constitutional: No fever/chills Eyes: No visual changes. ENT: No sore throat. Cardiovascular: Denies chest pain. Respiratory: Denies shortness of breath. Gastrointestinal: Positive for generalized abdominal pain, nausea and vomiting.  No diarrhea.  No constipation. Genitourinary: Negative for dysuria. Musculoskeletal: Negative for back pain. Skin: Negative for rash. Neurological: Negative for headaches, focal weakness or numbness.   ____________________________________________   PHYSICAL EXAM:  VITAL SIGNS: ED Triage Vitals  Enc Vitals Group     BP 06/17/19 2308 Marland Kitchen)  138/102     Pulse Rate 06/17/19 2308 (!) 103     Resp 06/17/19 2308 17     Temp 06/17/19 2308 99.2 F (37.3 C)     Temp Source 06/17/19 2308 Oral     SpO2 06/17/19 2308 97 %     Weight 06/17/19 2309 147 lb (66.7 kg)     Height 06/17/19 2309 5\' 6"  (1.676 m)     Head Circumference --      Peak Flow --      Pain Score --      Pain Loc --      Pain Edu? --      Excl. in GC? --      Constitutional: Alert and oriented. Well appearing and in no acute distress. Eyes: Small left lateral sub-conjunctival hemorrhage. PERRL. EOMI. Head: Atraumatic. Nose: No congestion/rhinnorhea. Mouth/Throat: Mucous membranes are moist.  Oropharynx non-erythematous. Neck: No stridor.  No cervical spine tenderness to palpation. Cardiovascular: Normal rate, regular rhythm. Grossly normal heart sounds.  Good peripheral circulation. Respiratory: Normal respiratory effort.  No retractions. Lungs CTAB. Gastrointestinal: Soft with mild generalized tenderness to palpation. No distention. No abdominal bruits. No CVA tenderness. Musculoskeletal: No lower extremity tenderness nor edema.  No joint effusions. Neurologic:  Normal speech and language. No gross focal neurologic deficits are appreciated. No gait instability. Skin:  Skin is warm, dry and intact. No rash noted. Psychiatric: Mood and affect are normal. Speech and behavior are normal.  ____________________________________________   LABS (all labs ordered are listed, but only abnormal results are displayed)  Labs Reviewed  CBC WITH DIFFERENTIAL/PLATELET - Abnormal; Notable for the following components:      Result Value   WBC 3.8 (*)    Neutro Abs 0.4 (*)    All other components within normal limits  COMPREHENSIVE METABOLIC PANEL - Abnormal; Notable for the following components:   Sodium 134 (*)    Potassium 3.4 (*)    Chloride 97 (*)    CO2 21 (*)    Glucose, Bld 122 (*)    Total Protein 8.3 (*)    AST 94 (*)    ALT 62 (*)    Anion gap 16 (*)    All other components within normal limits  ETHANOL - Abnormal; Notable for the following components:   Alcohol, Ethyl (B) 280 (*)    All other components within normal limits  LIPASE, BLOOD  TROPONIN I (HIGH SENSITIVITY)  TROPONIN I (HIGH SENSITIVITY)   ____________________________________________  EKG  ED ECG REPORT I, Layman Gully J, the attending physician, personally viewed and  interpreted this ECG.   Date: 06/18/2019  EKG Time: 2314  Rate: 98  Rhythm: normal EKG, normal sinus rhythm  Axis: Normal  Intervals:none  ST&T Change: BER  ____________________________________________  RADIOLOGY  ED MD interpretation: No acute findings  Official radiology report(s): Ct Abdomen Pelvis W Contrast  Result Date: 06/18/2019 CLINICAL DATA:  Generalized abdominal pain with nausea and vomiting. EXAM: CT ABDOMEN AND PELVIS WITH CONTRAST TECHNIQUE: Multidetector CT imaging of the abdomen and pelvis was performed using the standard protocol following bolus administration of intravenous contrast. CONTRAST:  08/18/2019 OMNIPAQUE IOHEXOL 300 MG/ML  SOLN COMPARISON:  07/27/2018 FINDINGS: Lower chest:  No contributory findings. Hepatobiliary: Hepatic steatosis.No evidence of biliary obstruction or stone. Pancreas: Unremarkable. Spleen: Unremarkable. Adrenals/Urinary Tract: Negative adrenals. No hydronephrosis or stone. Tiny left renal cystic density. Unremarkable bladder. Stomach/Bowel:  No obstruction. No appendicitis. Vascular/Lymphatic: No acute vascular abnormality. Aortic atherosclerosis, moderate for age. No mass  or adenopathy. Reproductive:No pathologic findings. Other: No ascites or pneumoperitoneum. Musculoskeletal: No acute abnormalities. IMPRESSION: 1. No acute finding. 2. Hepatic steatosis and aortic atherosclerosis. Electronically Signed   By: Marnee SpringJonathon  Watts M.D.   On: 06/18/2019 05:00    ____________________________________________   PROCEDURES  Procedure(s) performed (including Critical Care):  Procedures   ____________________________________________   INITIAL IMPRESSION / ASSESSMENT AND PLAN / ED COURSE  As part of my medical decision making, I reviewed the following data within the electronic MEDICAL RECORD NUMBER Nursing notes reviewed and incorporated, Labs reviewed, EKG interpreted, Old chart reviewed, Radiograph reviewed and Notes from prior ED visits      Kristine GarbeJohnathan T Brock was evaluated in Emergency Department on 06/18/2019 for the symptoms described in the history of present illness. He was evaluated in the context of the global COVID-19 pandemic, which necessitated consideration that the patient might be at risk for infection with the SARS-CoV-2 virus that causes COVID-19. Institutional protocols and algorithms that pertain to the evaluation of patients at risk for COVID-19 are in a state of rapid change based on information released by regulatory bodies including the CDC and federal and state organizations. These policies and algorithms were followed during the patient's care in the ED.    48 year old alcoholic with a history of alcohol withdrawal seizures who presents with generalized abdominal pain and desire for alcohol detox. Differential diagnosis includes, but is not limited to, biliary disease (biliary colic, acute cholecystitis, cholangitis, choledocholithiasis, etc), intrathoracic causes for epigastric abdominal pain including ACS, gastritis, duodenitis, pancreatitis, small bowel or large bowel obstruction, abdominal aortic aneurysm, hernia, and ulcer(s).  Laboratory results unremarkable; will check EtOH, repeat troponin; obtain CT abdomen/pelvis.  Initiate CIWA protocol and consult [facility] for detox.   Clinical Course as of Jun 17 644  Wed Jun 18, 2019  0408 Patient passed out from intoxication.  Drank 1 bottle contrast.  Will go ahead and obtain CT scan.   [JS]  0513 CT scan unremarkable.  [facility] referred [facility] to [facility]; awaiting acceptance.  Patient continues to sleep in no acute distress.   [JS]  [id number] Patient will be moved to the behavioral area of the emergency department awaiting acceptance from [facility].  Continues to sleep soundly.   [JS]    Clinical Course User Index [JS] Irean HongSung, Amaro Mangold J, MD     ____________________________________________   FINAL CLINICAL IMPRESSION(S) / ED DIAGNOSES  Final diagnoses:  Alcoholic intoxication  without complication (HCC)  Epigastric pain  Acute alcoholic gastritis without hemorrhage     ED Discharge Orders    None       Note:  This document was prepared using Dragon voice recognition software and may include unintentional dictation errors.   Irean HongSung, Soundra Lampley J, MD 06/18/19 [phone]

## 2019-06-18 NOTE — ED Notes (Signed)
Patient nervous and states that He needs something for withdrawal from the alcohol, nurse received for po ativan, will continue to monitor.

## 2019-06-18 NOTE — ED Notes (Signed)
[facility] has confirmed that they do have male detox beds available. Pts information will be forwarded for review.

## 2019-06-18 NOTE — ED Notes (Signed)
Patient awake, states " I feel like I'm going into withdrawal again, I'm shaky, nurse spoke to Dr. Jimmye Norman and he order extra ativan, Nurse will continue to monitor, Patient up to bathroom also.

## 2019-06-18 NOTE — ED Notes (Signed)
Patient voices understanding of discharge instructions, Patient was given resource material, saline lock removed without difficulty,Patient has all belongings, Patient's girlfriend on the way to pick him up. Nurse told him about [facility] in [place]. Patient without signs of distress.

## 2019-08-18 ENCOUNTER — Emergency Department
Admission: EM | Admit: 2019-08-18 | Discharge: 2019-08-19 | Disposition: A | Discharge status: Home / Self Care | Payer: Self-pay | Attending: Emergency Medicine | Admitting: Emergency Medicine

## 2019-08-18 ENCOUNTER — Emergency Department: Payer: Self-pay

## 2019-08-18 ENCOUNTER — Other Ambulatory Visit: Payer: Self-pay

## 2019-08-18 DIAGNOSIS — F10920 Alcohol use, unspecified with intoxication, uncomplicated: Secondary | ICD-10-CM | POA: Insufficient documentation

## 2019-08-18 DIAGNOSIS — Y908 Blood alcohol level of 240 mg/100 ml or more: Secondary | ICD-10-CM | POA: Insufficient documentation

## 2019-08-18 DIAGNOSIS — Z79899 Other long term (current) drug therapy: Secondary | ICD-10-CM | POA: Insufficient documentation

## 2019-08-18 DIAGNOSIS — M79672 Pain in left foot: Secondary | ICD-10-CM | POA: Insufficient documentation

## 2019-08-18 DIAGNOSIS — B351 Tinea unguium: Secondary | ICD-10-CM | POA: Insufficient documentation

## 2019-08-18 DIAGNOSIS — F17210 Nicotine dependence, cigarettes, uncomplicated: Secondary | ICD-10-CM | POA: Insufficient documentation

## 2019-08-18 MED ORDER — SODIUM CHLORIDE 0.9 % IV BOLUS
1000.0000 mL | Freq: Once | INTRAVENOUS | Status: AC
  Administered 2019-08-19: 1000 mL via INTRAVENOUS

## 2019-08-18 NOTE — ED Triage Notes (Signed)
Pt arrives to ED via [facility] from home with c/o left foot pain x1 week. Pt states it is "black and gangrene"; but upon assessment pt's foot is not black, but has a blacken big toe nail and covered in dry skin. Pt reports 4 beers PTA. Pt is alert, obviously intoxicated, in NAD; RR even, regular and unlabored.

## 2019-08-18 NOTE — ED Provider Notes (Signed)
[facility] Emergency Department Provider Note   ____________________________________________   First MD Initiated Contact with Patient 08/18/19 2345     (approximate)  I have reviewed the triage vital signs and the nursing notes.   HISTORY  Chief Complaint Foot Pain  Level V caveat: limited by poor historian and alcohol intoxication  HPI Terry Clark is a 48 y.o. male brought to the ED via EMS from home with a chief complaint of left foot pain.  Patient reports the foot is "black and gangrene".  Denies injury or trauma.  Denies fever, cough, chest pain, shortness of breath, abdominal pain, nausea or vomiting.  Obvious intoxication.       Past Medical History:  Diagnosis Date  . Alcohol abuse   . Alcohol withdrawal seizure [facility])     Patient Active Problem List   Diagnosis Date Noted  . Perceptual disturbances and seizures concurrent with and due to alcohol withdrawal (HCC) 05/26/2019  . Alcohol withdrawal (HCC) 02/26/2019  . Hematemesis   . GI bleed 09/17/2018  . Alcoholic intoxication with complication (HCC)   . Anger   . Subconjunctival hemorrhage of both eyes   . Alcohol withdrawal seizure (HCC) 06/13/2014  . 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 ESOPHAGOGASTRODUODENOSCOPY N/A 09/18/2018   Procedure: ESOPHAGOGASTRODUODENOSCOPY (EGD);  Surgeon: Toney Reil, MD;  Location: [facility] ENDOSCOPY;  Service: Gastroenterology;  Laterality: N/A;  . TONSILLECTOMY      Prior to Admission medications   Medication Sig Start Date End Date Taking? Authorizing Provider  amLODipine (NORVASC) 10 MG tablet Take 1 tablet (10 mg total) by mouth daily. 06/02/19   Auburn Bilberry, MD  chlordiazePOXIDE (LIBRIUM) 25 MG capsule Take 1 tablet by mouth 5 times a day on day 1, then decrease by 1 tablet daily until gone. 06/18/19   Emily Filbert, MD  diazepam (VALIUM) 2 MG tablet Take 1 tablet (2 mg total)  by mouth every 8 (eight) hours as needed for anxiety (tremmors). 06/02/19 06/01/20  Auburn Bilberry, MD  famotidine (PEPCID) 20 MG tablet Take 1 tablet (20 mg total) by mouth 2 (two) times daily. 06/18/19   Emily Filbert, MD  folic acid (FOLVITE) 1 MG tablet Take 1 tablet (1 mg total) by mouth daily. 06/02/19   Auburn Bilberry, MD  Multiple Vitamin (MULTIVITAMIN WITH MINERALS) TABS tablet Take 1 tablet by mouth daily. 06/02/19   Auburn Bilberry, MD  thiamine 100 MG tablet Take 1 tablet (100 mg total) by mouth daily. 06/02/19   Auburn Bilberry, MD    Allergies Patient has no known allergies.  Family History  Problem Relation Age of Onset  . Kidney failure Mother   . Cancer Other     Social History Social History   Tobacco Use  . Smoking status: Current Every Day Smoker    Packs/day: 1.00  . Smokeless tobacco: Never Used  Substance Use Topics  . Alcohol use: Yes    Comment: case of beer and fifth of liquor daily  . Drug use: Not Currently    Review of Systems  Constitutional: No fever/chills Eyes: No visual changes. ENT: No sore throat. Cardiovascular: Denies chest pain. Respiratory: Denies shortness of breath. Gastrointestinal: No abdominal pain.  No nausea, no vomiting.  No diarrhea.  No constipation. Genitourinary: Negative for dysuria. Musculoskeletal: Positive for left foot pain.  Negative for back pain. Skin: Negative for rash. Neurological: Negative for headaches, focal weakness or numbness.  ____________________________________________   PHYSICAL EXAM:  VITAL SIGNS: ED Triage Vitals  Enc Vitals Group     BP 08/18/19 2319 (!) 144/113     Pulse Rate 08/18/19 2319 78     Resp 08/18/19 2319 17     Temp 08/18/19 2319 97.7 F (36.5 C)     Temp Source 08/18/19 2319 Oral     SpO2 08/18/19 2319 99 %     Weight 08/18/19 2315 140 lb (63.5 kg)     Height 08/18/19 2315 5\' 7"  (1.702 m)     Head Circumference --      Peak Flow --      Pain Score 08/18/19 2315 10      Pain Loc --      Pain Edu? --      Excl. in GC? --     Constitutional: Asleep, awakened for exam.  Alert and oriented. Well appearing and in no acute distress. Eyes: Conjunctivae are normal. PERRL. EOMI. Head: Atraumatic. Nose: No congestion/rhinnorhea. Mouth/Throat: Mucous membranes are moist.  Oropharynx non-erythematous. Neck: No stridor.   Cardiovascular: Normal rate, regular rhythm. Grossly normal heart sounds.  Good peripheral circulation. Respiratory: Normal respiratory effort.  No retractions. Lungs CTAB. Gastrointestinal: Soft and nontender. No distention. No abdominal bruits. No CVA tenderness. Musculoskeletal:  Left great toenail blackened not consistent with acute trauma but more consistent with onychomycosis.  Skin is dry and flaky.  2+ distal pulses.  Brisk, less than 5-second capillary refill. Neurologic:  Normal speech and language. No gross focal neurologic deficits are appreciated.  Skin:  Skin is warm, dry and intact. No rash noted. Psychiatric: Mood and affect are normal. Speech and behavior are normal.  ____________________________________________   LABS (all labs ordered are listed, but only abnormal results are displayed)  Labs Reviewed  CBC WITH DIFFERENTIAL/PLATELET - Abnormal; Notable for the following components:      Result Value   RBC 3.74 (*)    Hemoglobin 12.1 (*)    HCT 34.7 (*)    RDW 18.3 (*)    Platelets 101 (*)    All other components within normal limits  BASIC METABOLIC PANEL - Abnormal; Notable for the following components:   Sodium 130 (*)    Chloride 96 (*)    Glucose, Bld 137 (*)    Calcium 7.8 (*)    All other components within normal limits  ETHANOL - Abnormal; Notable for the following components:   Alcohol, Ethyl (B) 421 (*)    All other components within normal limits   ____________________________________________  EKG  None ____________________________________________  RADIOLOGY  ED MD interpretation: Negative  left foot x-ray  Official radiology report(s): Dg Foot Complete Left  Result Date: 08/19/2019 CLINICAL DATA:  Great toe pain. EXAM: LEFT FOOT - COMPLETE 3+ VIEW COMPARISON:  None. FINDINGS: There is no evidence of fracture or dislocation. There is no evidence of arthropathy or other focal bone abnormality. Soft tissues are unremarkable. IMPRESSION: Negative. Electronically Signed   By: Katherine Mantlehristopher  Green M.D.   On: 08/19/2019 00:08    ____________________________________________   PROCEDURES  Procedure(s) performed (including Critical Care):  Procedures   ____________________________________________   INITIAL IMPRESSION / ASSESSMENT AND PLAN / ED COURSE  As part of my medical decision making, I reviewed the following data within the electronic MEDICAL RECORD NUMBER Nursing notes reviewed and incorporated, Labs reviewed, Old chart reviewed, Radiograph reviewed and Notes from prior ED visits     Kristine GarbeJohnathan T Dishner was evaluated in Emergency Department on 08/19/2019  for the symptoms described in the history of present illness. He was evaluated in the context of the global COVID-19 pandemic, which necessitated consideration that the patient might be at risk for infection with the SARS-CoV-2 virus that causes COVID-19. Institutional protocols and algorithms that pertain to the evaluation of patients at risk for COVID-19 are in a state of rapid change based on information released by regulatory bodies including the CDC and federal and state organizations. These policies and algorithms were followed during the patient's care in the ED.    48 year old male who presents with a 1 week history of left foot pain.  Differential diagnosis includes but is not limited to musculoskeletal, infectious, vascular etiologies, etc.  Patient is intoxicated and therefore poor historian.  On examination, left foot is dry and flaky but not gangrenous.  Toenail is blackened without erythema or gangrene.  Appears to be a  chronic process.  No vascular compromise.  X-rays unremarkable.  Will check basic labs, initiate IV fluid resuscitation, discharge once sober.  Clinical Course as of Aug 18 654  Tue Aug 19, 2019  0217 Patient soundly asleep in no acute distress.   [JS]  [id number] Patient remains soundly asleep in no acute distress.  Care will be transferred to the oncoming provider.  Anticipate discharge home once patient is sober and ambulatory with steady gait.  Will refer to podiatry for outpatient follow-up.   [JS]    Clinical Course User Index [JS] Paulette Blanch, MD     ____________________________________________   FINAL CLINICAL IMPRESSION(S) / ED DIAGNOSES  Final diagnoses:  Foot pain, left  Alcoholic intoxication without complication ([place])  Onychomycosis     ED Discharge Orders    None       Note:  This document was prepared using Dragon voice recognition software and may include unintentional dictation errors.   Paulette Blanch, MD 08/19/19 [phone]

## 2019-08-19 LAB — CBC WITH DIFFERENTIAL/PLATELET
Abs Immature Granulocytes: 0.03 10*3/uL (ref 0.00–0.07)
Basophils Absolute: 0 10*3/uL (ref 0.0–0.1)
Basophils Relative: 1 %
Eosinophils Absolute: 0 10*3/uL (ref 0.0–0.5)
Eosinophils Relative: 0 %
HCT: 34.7 % — ABNORMAL LOW (ref 39.0–52.0)
Hemoglobin: 12.1 g/dL — ABNORMAL LOW (ref 13.0–17.0)
Immature Granulocytes: 1 %
Lymphocytes Relative: 28 %
Lymphs Abs: 1.1 10*3/uL (ref 0.7–4.0)
MCH: 32.4 pg (ref 26.0–34.0)
MCHC: 34.9 g/dL (ref 30.0–36.0)
MCV: 92.8 fL (ref 80.0–100.0)
Monocytes Absolute: 0.3 10*3/uL (ref 0.1–1.0)
Monocytes Relative: 9 %
Neutro Abs: 2.5 10*3/uL (ref 1.7–7.7)
Neutrophils Relative %: 61 %
Platelets: 101 10*3/uL — ABNORMAL LOW (ref 150–400)
RBC: 3.74 MIL/uL — ABNORMAL LOW (ref 4.22–5.81)
RDW: 18.3 % — ABNORMAL HIGH (ref 11.5–15.5)
WBC: 4 10*3/uL (ref 4.0–10.5)
nRBC: 0 % (ref 0.0–0.2)

## 2019-08-19 LAB — BASIC METABOLIC PANEL
Anion gap: 9 (ref 5–15)
BUN: 15 mg/dL (ref 6–20)
CO2: 25 mmol/L (ref 22–32)
Calcium: 7.8 mg/dL — ABNORMAL LOW (ref 8.9–10.3)
Chloride: 96 mmol/L — ABNORMAL LOW (ref 98–111)
Creatinine, Ser: 0.62 mg/dL (ref 0.61–1.24)
GFR calc Af Amer: >60 mL/min (ref >60)
GFR calc non Af Amer: >60 mL/min (ref >60)
Glucose, Bld: 137 mg/dL — ABNORMAL HIGH (ref 70–99)
Potassium: 3.9 mmol/L (ref 3.5–5.1)
Sodium: 130 mmol/L — ABNORMAL LOW (ref 135–145)

## 2019-08-19 LAB — ETHANOL: Alcohol, Ethyl (B): 421 mg/dL (ref <10)

## 2019-08-19 MED ORDER — LORAZEPAM 2 MG/ML IJ SOLN
1.0000 mg | Freq: Once | INTRAMUSCULAR | Status: AC
  Administered 2019-08-19: 01:00:00 1 mg via INTRAVENOUS

## 2019-08-19 MED ORDER — LORAZEPAM 2 MG/ML IJ SOLN
INTRAMUSCULAR | Status: AC
  Administered 2019-08-19: 01:00:00 1 mg via INTRAVENOUS
  Filled 2019-08-19: qty 1

## 2019-08-19 MED ORDER — VITAMIN B-1 100 MG PO TABS
100.0000 mg | ORAL_TABLET | Freq: Every day | ORAL | Status: DC
  Administered 2019-08-19: 100 mg via ORAL
  Filled 2019-08-19: qty 1

## 2019-08-19 MED ORDER — LORAZEPAM 2 MG/ML IJ SOLN
0.0000 mg | Freq: Four times a day (QID) | INTRAMUSCULAR | Status: DC
  Administered 2019-08-19: 1 mg via INTRAVENOUS
  Filled 2019-08-19: qty 1

## 2019-08-19 NOTE — Discharge Instructions (Signed)
Drink alcohol only in moderation.  Follow-up with the foot specialist for further evaluation of your toenail.  Return to the ER for worsening symptoms, persistent vomiting, difficulty breathing or other concerns.

## 2019-08-19 NOTE — ED Provider Notes (Signed)
-----------------------------------------   9:59 AM on 08/19/2019 -----------------------------------------  Patient appears clinically sober.  He was discharged from the emergency department at this time.   Harvest Dark, MD 08/19/19 [phone]

## 2019-10-13 ENCOUNTER — Emergency Department
Admission: EM | Admit: 2019-10-13 | Discharge: 2019-10-14 | Disposition: A | Discharge status: Home / Self Care | Payer: Self-pay | Attending: Emergency Medicine | Admitting: Emergency Medicine

## 2019-10-13 ENCOUNTER — Other Ambulatory Visit: Payer: Self-pay

## 2019-10-13 DIAGNOSIS — F10120 Alcohol abuse with intoxication, uncomplicated: Secondary | ICD-10-CM | POA: Insufficient documentation

## 2019-10-13 DIAGNOSIS — F10920 Alcohol use, unspecified with intoxication, uncomplicated: Secondary | ICD-10-CM

## 2019-10-13 DIAGNOSIS — F17200 Nicotine dependence, unspecified, uncomplicated: Secondary | ICD-10-CM | POA: Insufficient documentation

## 2019-10-13 LAB — COMPREHENSIVE METABOLIC PANEL
ALT: 173 U/L — ABNORMAL HIGH (ref 0–44)
AST: 242 U/L — ABNORMAL HIGH (ref 15–41)
Albumin: 3.4 g/dL — ABNORMAL LOW (ref 3.5–5.0)
Alkaline Phosphatase: 144 U/L — ABNORMAL HIGH (ref 38–126)
Anion gap: 18 — ABNORMAL HIGH (ref 5–15)
BUN: 20 mg/dL (ref 6–20)
CO2: 20 mmol/L — ABNORMAL LOW (ref 22–32)
Calcium: 8.4 mg/dL — ABNORMAL LOW (ref 8.9–10.3)
Chloride: 94 mmol/L — ABNORMAL LOW (ref 98–111)
Creatinine, Ser: 0.98 mg/dL (ref 0.61–1.24)
GFR calc Af Amer: >60 mL/min (ref >60)
GFR calc non Af Amer: >60 mL/min (ref >60)
Glucose, Bld: 153 mg/dL — ABNORMAL HIGH (ref 70–99)
Potassium: 4 mmol/L (ref 3.5–5.1)
Sodium: 132 mmol/L — ABNORMAL LOW (ref 135–145)
Total Bilirubin: 1.7 mg/dL — ABNORMAL HIGH (ref 0.3–1.2)
Total Protein: 7.5 g/dL (ref 6.5–8.1)

## 2019-10-13 LAB — CBC
HCT: 41.4 % (ref 39.0–52.0)
Hemoglobin: 14.1 g/dL (ref 13.0–17.0)
MCH: 32 pg (ref 26.0–34.0)
MCHC: 34.1 g/dL (ref 30.0–36.0)
MCV: 94.1 fL (ref 80.0–100.0)
Platelets: 153 10*3/uL (ref 150–400)
RBC: 4.4 MIL/uL (ref 4.22–5.81)
RDW: 12.8 % (ref 11.5–15.5)
WBC: 4.3 10*3/uL (ref 4.0–10.5)
nRBC: 0 % (ref 0.0–0.2)

## 2019-10-13 LAB — ETHANOL: Alcohol, Ethyl (B): 462 mg/dL (ref <10)

## 2019-10-13 MED ORDER — LORAZEPAM 1 MG PO TABS
1.0000 mg | ORAL_TABLET | Freq: Once | ORAL | Status: AC
  Administered 2019-10-13: 1 mg via ORAL
  Filled 2019-10-13: qty 1

## 2019-10-13 NOTE — ED Notes (Signed)
Recollect of red sent to lab. Pt given meal tray at this time.

## 2019-10-13 NOTE — ED Notes (Signed)
Pt given meal tray.

## 2019-10-13 NOTE — ED Notes (Signed)
Pt reports he needs Ativan "right now", pt states he is going to go into [facility]. Dr. Mayford Knife informed

## 2019-10-13 NOTE — ED Notes (Signed)
Pt requesting his ativan. Says that, "he should be getting it every 3 hours because he is a bad alcoholic."   LW edt

## 2019-10-13 NOTE — Discharge Instructions (Addendum)
Please seek medical attention for any high fevers, chest pain, shortness of breath, change in behavior, persistent vomiting, bloody stool or any other new or concerning symptoms.  

## 2019-10-13 NOTE — ED Notes (Signed)
Verbal given by Dr. Mayford Knife to give 1mg  ativan PO stat

## 2019-10-13 NOTE — ED Notes (Addendum)
Pt denies any SI or HI. Pt states that he has been staying outside for the last three days. Pt states he drank 7 beers today. Pt states he normally drinks more than that. Pt states some pain to his side. Pt resting and denies any other complaints.

## 2019-10-13 NOTE — ED Triage Notes (Signed)
First Nurse Note:  Arrives via EMS with C/O bilateral foot pain x several months. Per EMS patient reports that toes are black.  Patient homeless and lives outside.  [facility].

## 2019-10-13 NOTE — ED Provider Notes (Signed)
[facility] Emergency Department Provider Note   ____________________________________________   I have reviewed the triage vital signs and the nursing notes.   HISTORY  Chief Complaint Help   History limited by: Intoxication   HPI Terry Clark is a 49 y.o. male who presents to the emergency department today because he states he needs help.  Patient is intoxicated and it is a hard time understanding what help the patient is requesting.  He does discuss having continued foot pain.  He states nothing has changed with this pain since he was seen for it 2 months ago in the emergency department.  He states he has not followed up for it.  He also complains of alcohol use.  It is unclear if he is looking for detox.  The patient continues to repeat that he needs help however again is unclear what help he is asking for.   Records reviewed. Per medical record review patient has a history of alcohol abuse.   Past Medical History:  Diagnosis Date  . Alcohol abuse   . Alcohol withdrawal seizure [facility])     Patient Active Problem List   Diagnosis Date Noted  . 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 Alcohol withdrawal ([place]) 02/26/2019  . Hematemesis   . GI bleed 09/17/2018  . Alcoholic intoxication with complication ([place])   . Anger   . Subconjunctival hemorrhage of both eyes   . Alcohol withdrawal seizure ([place]) 06/13/2014  . Alcoholic hepatitis 74/14/2395    Past Surgical History:  Procedure Laterality Date  . ESOPHAGOGASTRODUODENOSCOPY N/A 09/18/2018   Procedure: ESOPHAGOGASTRODUODENOSCOPY (EGD);  Surgeon: Lin Landsman, MD;  Location: [facility] ENDOSCOPY;  Service: Gastroenterology;  Laterality: N/A;  . TONSILLECTOMY      Prior to Admission medications   Medication Sig Start Date End Date Taking? Authorizing Provider  amLODipine (NORVASC) 10 MG tablet Take 1 tablet (10 mg total) by mouth  daily. 06/02/19   Dustin Flock, MD  chlordiazePOXIDE (LIBRIUM) 25 MG capsule Take 1 tablet by mouth 5 times a day on day 1, then decrease by 1 tablet daily until gone. 06/18/19   Earleen Newport, MD  diazepam (VALIUM) 2 MG tablet Take 1 tablet (2 mg total) by mouth every 8 (eight) hours as needed for anxiety (tremmors). 06/02/19 06/01/20  Dustin Flock, MD  famotidine (PEPCID) 20 MG tablet Take 1 tablet (20 mg total) by mouth 2 (two) times daily. 06/18/19   Earleen Newport, MD  folic acid (FOLVITE) 1 MG tablet Take 1 tablet (1 mg total) by mouth daily. 06/02/19   Dustin Flock, MD  Multiple Vitamin (MULTIVITAMIN WITH MINERALS) TABS tablet Take 1 tablet by mouth daily. 06/02/19   Dustin Flock, MD  thiamine 100 MG tablet Take 1 tablet (100 mg total) by mouth daily. 06/02/19   Dustin Flock, MD    Allergies Patient has no known allergies.  Family History  Problem Relation Age of Onset  . Kidney failure Mother   . Cancer Other     Social History Social History   Tobacco Use  . Smoking status: Current Every Day Smoker    Packs/day: 1.00  . Smokeless tobacco: Never Used  Substance Use Topics  . Alcohol use: Yes    Comment: case of beer and fifth of liquor daily  . Drug use: Not Currently    Review of Systems Constitutional: No fever/chills Eyes: No visual changes. ENT: No sore throat. Cardiovascular: Denies chest  pain. Respiratory: Denies shortness of breath. Gastrointestinal: No abdominal pain.  No nausea, no vomiting.  No diarrhea.   Genitourinary: Negative for dysuria. Musculoskeletal: Positive for continued foot pain. Skin: Negative for rash. Neurological: Negative for headaches, focal weakness or numbness.  ____________________________________________   PHYSICAL EXAM:  VITAL SIGNS: ED Triage Vitals  Enc Vitals Group     BP 10/13/19 1407 (!) 140/106     Pulse Rate 10/13/19 1407 (!) 118     Resp 10/13/19 1407 20     Temp 10/13/19 1407 97.7 F (36.5 C)      Temp Source 10/13/19 1407 Oral     SpO2 10/13/19 1407 96 %     Weight 10/13/19 1405 146 lb (66.2 kg)     Height 10/13/19 1405 '5\' 7"'$  (1.702 m)     Head Circumference --      Peak Flow --      Pain Score 10/13/19 1404 9   Constitutional: Alert. Appears intoxicated. Eyes: Conjunctivae are normal.  ENT      Head: Normocephalic and atraumatic.      Nose: No congestion/rhinnorhea.      Mouth/Throat: Mucous membranes are moist.      Neck: No stridor. Hematological/Lymphatic/Immunilogical: No cervical lymphadenopathy. Cardiovascular: Tachycardic, regular rhythm.  No murmurs, rubs, or gallops.  Respiratory: Normal respiratory effort without tachypnea nor retractions. Breath sounds are clear and equal bilaterally. No wheezes/rales/rhonchi. Gastrointestinal: Soft and non tender. No rebound. No guarding.  Genitourinary: Deferred Musculoskeletal: Normal range of motion in all extremities. No lower extremity edema. Neurologic:  Appears intoxicated.  Skin:  Skin is warm, dry and intact. No rash noted. ____________________________________________    LABS (pertinent positives/negatives)  CBC wbc 4.3, hgb 14.1, plt 153 CMP na 132, cl 94, glu 153, cr 0.98, ast 242, alt 173, alk phos 144, t bili 1.7 Ethanol 462 ____________________________________________   EKG  I, Nance Pear, attending physician, personally viewed and interpreted this EKG  EKG Time: 1409 Rate: 136 Rhythm: sinus tachycardia Axis: right axis deviation Intervals: qtc 436 QRS: LPFB ST changes: no st elevation Impression: abnormal ekg   ____________________________________________    RADIOLOGY  None  ____________________________________________   PROCEDURES  Procedures  ____________________________________________   INITIAL IMPRESSION / ASSESSMENT AND PLAN / ED COURSE  Pertinent labs & imaging results that were available during my care of the patient were reviewed by me and considered in my  medical decision making (see chart for details).   Patient presented to the emergency department today because of unclear concerns.  He is asked for help but has a hard time explaining what he was hoping we can help with today.  Did have some complaints of continued left foot pain for which she was seen a couple months ago and has not yet followed up.  Patient appeared to be intoxicated and did have a bottle of alcohol on his person.  Blood work does show significantly elevated ethanol level.  Blood work also shows some transaminitis which is likely due to alcohol use.  Will observe patient and await sobriety to reassess.   Patient appears to be slightly less intoxicated after multiple hours.  He is still somewhat unstable.  Will however prepare discharge paperwork for continued sobriety. ____________________________________________   FINAL CLINICAL IMPRESSION(S) / ED DIAGNOSES  Final diagnoses:  Alcoholic intoxication without complication ([place])     Note: This dictation was prepared with Dragon dictation. Any transcriptional errors that result from this process are unintentional     Nance Pear, MD  10/13/19 2330  

## 2019-10-13 NOTE — ED Notes (Addendum)
Pt here via [facility] for bilateral foot pain x 4 months. Pt reports that toes are black. PT reports he drinks every day and states he drinks 4-5 fifths of liquor per day. PT reports that he is homeless

## 2019-10-14 MED ORDER — CHLORDIAZEPOXIDE HCL 25 MG PO CAPS
50.0000 mg | ORAL_CAPSULE | Freq: Once | ORAL | Status: AC
  Administered 2019-10-14: 50 mg via ORAL
  Filled 2019-10-14: qty 2

## 2019-10-22 ENCOUNTER — Emergency Department
Admission: EM | Admit: 2019-10-22 | Discharge: 2019-10-22 | Disposition: A | Discharge status: Home / Self Care | Payer: Self-pay | Attending: Emergency Medicine | Admitting: Emergency Medicine

## 2019-10-22 ENCOUNTER — Other Ambulatory Visit: Payer: Self-pay

## 2019-10-22 DIAGNOSIS — M549 Dorsalgia, unspecified: Secondary | ICD-10-CM | POA: Insufficient documentation

## 2019-10-22 DIAGNOSIS — F1010 Alcohol abuse, uncomplicated: Secondary | ICD-10-CM | POA: Insufficient documentation

## 2019-10-22 DIAGNOSIS — R748 Abnormal levels of other serum enzymes: Secondary | ICD-10-CM | POA: Insufficient documentation

## 2019-10-22 DIAGNOSIS — F17210 Nicotine dependence, cigarettes, uncomplicated: Secondary | ICD-10-CM | POA: Insufficient documentation

## 2019-10-22 DIAGNOSIS — Z79899 Other long term (current) drug therapy: Secondary | ICD-10-CM | POA: Insufficient documentation

## 2019-10-22 DIAGNOSIS — Y908 Blood alcohol level of 240 mg/100 ml or more: Secondary | ICD-10-CM | POA: Insufficient documentation

## 2019-10-22 LAB — URINE DRUG SCREEN, QUALITATIVE (ARMC ONLY)
Amphetamines, Ur Screen: NOT DETECTED
Barbiturates, Ur Screen: NOT DETECTED
Benzodiazepine, Ur Scrn: POSITIVE — AB
Cannabinoid 50 Ng, Ur ~~LOC~~: POSITIVE — AB
Cocaine Metabolite,Ur ~~LOC~~: NOT DETECTED
MDMA (Ecstasy)Ur Screen: NOT DETECTED
Methadone Scn, Ur: NOT DETECTED
Opiate, Ur Screen: NOT DETECTED
Phencyclidine (PCP) Ur S: NOT DETECTED
Tricyclic, Ur Screen: NOT DETECTED

## 2019-10-22 LAB — COMPREHENSIVE METABOLIC PANEL
ALT: 141 U/L — ABNORMAL HIGH (ref 0–44)
AST: 341 U/L — ABNORMAL HIGH (ref 15–41)
Albumin: 3.6 g/dL (ref 3.5–5.0)
Alkaline Phosphatase: 175 U/L — ABNORMAL HIGH (ref 38–126)
Anion gap: 13 (ref 5–15)
BUN: 11 mg/dL (ref 6–20)
CO2: 29 mmol/L (ref 22–32)
Calcium: 8.5 mg/dL — ABNORMAL LOW (ref 8.9–10.3)
Chloride: 96 mmol/L — ABNORMAL LOW (ref 98–111)
Creatinine, Ser: 0.78 mg/dL (ref 0.61–1.24)
GFR calc Af Amer: >60 mL/min (ref >60)
GFR calc non Af Amer: >60 mL/min (ref >60)
Glucose, Bld: 144 mg/dL — ABNORMAL HIGH (ref 70–99)
Potassium: 3.5 mmol/L (ref 3.5–5.1)
Sodium: 138 mmol/L (ref 135–145)
Total Bilirubin: 1.6 mg/dL — ABNORMAL HIGH (ref 0.3–1.2)
Total Protein: 8 g/dL (ref 6.5–8.1)

## 2019-10-22 LAB — CBC
HCT: 39.6 % (ref 39.0–52.0)
Hemoglobin: 13.6 g/dL (ref 13.0–17.0)
MCH: 31.6 pg (ref 26.0–34.0)
MCHC: 34.3 g/dL (ref 30.0–36.0)
MCV: 92.1 fL (ref 80.0–100.0)
Platelets: 142 10*3/uL — ABNORMAL LOW (ref 150–400)
RBC: 4.3 MIL/uL (ref 4.22–5.81)
RDW: 12.7 % (ref 11.5–15.5)
WBC: 4.1 10*3/uL (ref 4.0–10.5)
nRBC: 0 % (ref 0.0–0.2)

## 2019-10-22 LAB — URINALYSIS, COMPLETE (UACMP) WITH MICROSCOPIC
Bacteria, UA: NONE SEEN
Bilirubin Urine: NEGATIVE
Glucose, UA: NEGATIVE mg/dL
Hgb urine dipstick: NEGATIVE
Ketones, ur: NEGATIVE mg/dL
Leukocytes,Ua: NEGATIVE
Nitrite: NEGATIVE
Protein, ur: 30 mg/dL — AB
Specific Gravity, Urine: 1.008 (ref 1.005–1.030)
pH: 6 (ref 5.0–8.0)

## 2019-10-22 LAB — ETHANOL: Alcohol, Ethyl (B): 425 mg/dL (ref <10)

## 2019-10-22 NOTE — ED Notes (Signed)
Patient calling friend for ride

## 2019-10-22 NOTE — ED Notes (Signed)
Patient picked up by sister, escorted to sister's vehicle. NAD, educated on the importance of following up with [facility] for sobriety

## 2019-10-22 NOTE — ED Notes (Signed)
Date and time results received: 10/22/19 1945 (use smartphrase ".now" to insert current time)  Test: etoh Critical Value: 425  Name of Provider Notified: Derrill Kay, MD  Orders Received? Or Actions Taken?: Actions Taken: patient assessed

## 2019-10-22 NOTE — ED Notes (Signed)
Attempted to reach patient's mother x3 without answer. Will try to find another ride option for patient

## 2019-10-22 NOTE — ED Notes (Signed)
Attempting to call friend again for ride

## 2019-10-22 NOTE — ED Notes (Signed)
Sister coming to pick up patient in about 20 minutes

## 2019-10-22 NOTE — Discharge Instructions (Addendum)
As we discussed there is evidence in your blood work that your liver is being injured by your drinking. It is very important that you seek help to stop drinking alcohol.

## 2019-10-22 NOTE — ED Provider Notes (Signed)
[facility] Emergency Department Provider Note  ____________________________________________   I have reviewed the triage vital signs and the nursing notes.   HISTORY  Chief Complaint Dark urine  History limited by: Not Limited   HPI Terry Clark is a 49 y.o. male who presents to the emergency department today with primary concern that his kidneys might be shutting down.  Patient states that for the past month he has had back pain.  He is also had dark urine.  He thinks that the symptoms have gotten worse over the past couple days.  He is concerned that his kidney function is getting bad.  The patient states he last drank alcohol 2 days ago.  Records reviewed. Per medical record review patient has a history of alcohol abuse.   Past Medical History:  Diagnosis Date  . Alcohol abuse   . Alcohol withdrawal seizure [facility])     Patient Active Problem List   Diagnosis Date Noted  . 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 Alcohol withdrawal ([place]) 02/26/2019  . Hematemesis   . GI bleed 09/17/2018  . Alcoholic intoxication with complication ([place])   . Anger   . Subconjunctival hemorrhage of both eyes   . Alcohol withdrawal seizure ([place]) 06/13/2014  . Alcoholic hepatitis 30/16/0109    Past Surgical History:  Procedure Laterality Date  . ESOPHAGOGASTRODUODENOSCOPY N/A 09/18/2018   Procedure: ESOPHAGOGASTRODUODENOSCOPY (EGD);  Surgeon: Lin Landsman, MD;  Location: [facility] ENDOSCOPY;  Service: Gastroenterology;  Laterality: N/A;  . TONSILLECTOMY      Prior to Admission medications   Medication Sig Start Date End Date Taking? Authorizing Provider  amLODipine (NORVASC) 10 MG tablet Take 1 tablet (10 mg total) by mouth daily. 06/02/19   Dustin Flock, MD  chlordiazePOXIDE (LIBRIUM) 25 MG capsule Take 1 tablet by mouth 5 times a day on day 1, then decrease by 1 tablet daily until gone. 06/18/19    Earleen Newport, MD  diazepam (VALIUM) 2 MG tablet Take 1 tablet (2 mg total) by mouth every 8 (eight) hours as needed for anxiety (tremmors). 06/02/19 06/01/20  Dustin Flock, MD  famotidine (PEPCID) 20 MG tablet Take 1 tablet (20 mg total) by mouth 2 (two) times daily. 06/18/19   Earleen Newport, MD  folic acid (FOLVITE) 1 MG tablet Take 1 tablet (1 mg total) by mouth daily. 06/02/19   Dustin Flock, MD  Multiple Vitamin (MULTIVITAMIN WITH MINERALS) TABS tablet Take 1 tablet by mouth daily. 06/02/19   Dustin Flock, MD  thiamine 100 MG tablet Take 1 tablet (100 mg total) by mouth daily. 06/02/19   Dustin Flock, MD    Allergies Patient has no known allergies.  Family History  Problem Relation Age of Onset  . Kidney failure Mother   . Cancer Other     Social History Social History   Tobacco Use  . Smoking status: Current Every Day Smoker    Packs/day: 1.00  . Smokeless tobacco: Never Used  Substance Use Topics  . Alcohol use: Yes    Comment: case of beer and fifth of liquor daily  . Drug use: Not Currently    Review of Systems Constitutional: No fever/chills Eyes: No visual changes. ENT: No sore throat. Cardiovascular: Denies chest pain. Respiratory: Denies shortness of breath. Gastrointestinal: No abdominal pain.  No nausea, no vomiting.  No diarrhea.   Genitourinary: Positive for dark urine. Musculoskeletal: Positive for back pain. Skin: Negative for rash. Neurological:  Negative for headaches, focal weakness or numbness.  ____________________________________________   PHYSICAL EXAM:  VITAL SIGNS: ED Triage Vitals  Enc Vitals Group     BP --      Pulse Rate 10/22/19 1828 (!) 105     Resp 10/22/19 1828 18     Temp 10/22/19 1828 97.7 F (36.5 C)     Temp Source 10/22/19 1828 Oral     SpO2 10/22/19 1828 99 %     Weight 10/22/19 1827 140 lb (63.5 kg)     Height 10/22/19 1827 5' 7"  (1.702 m)     Head Circumference --      Peak Flow --      Pain  Score 10/22/19 1827 10   Constitutional: Alert and oriented.  Eyes: Conjunctivae are normal.  ENT      Head: Normocephalic and atraumatic.      Nose: No congestion/rhinnorhea.      Mouth/Throat: Mucous membranes are moist.      Neck: No stridor. Hematological/Lymphatic/Immunilogical: No cervical lymphadenopathy. Cardiovascular: Normal rate, regular rhythm.  No murmurs, rubs, or gallops.  Respiratory: Normal respiratory effort without tachypnea nor retractions. Breath sounds are clear and equal bilaterally. No wheezes/rales/rhonchi. Gastrointestinal: Soft and non tender. No rebound. No guarding.  Genitourinary: Deferred Musculoskeletal: Normal range of motion in all extremities. No lower extremity edema. Neurologic:  Normal speech and language. No gross focal neurologic deficits are appreciated.  Skin:  Skin is warm, dry and intact. No rash noted. Psychiatric: Mood and affect are normal. Speech and behavior are normal. Patient exhibits appropriate insight and judgment.  ____________________________________________    LABS (pertinent positives/negatives)  CBC wbc 4.1, hgb 13.6, plt 142 Ethanol 425 CMP cl 96, glu 144, ca 8.5, ast 341, alt 141, alk phos 175, t bili 1.6 UA hazy, protein 30 otherwise unremarkable ____________________________________________   EKG  None  ____________________________________________    RADIOLOGY  None   ____________________________________________   PROCEDURES  Procedures  ____________________________________________   INITIAL IMPRESSION / ASSESSMENT AND PLAN / ED COURSE  Pertinent labs & imaging results that were available during my care of the patient were reviewed by me and considered in my medical decision making (see chart for details).   Patient's blood work and urine without concerning signs for any kidney damage heart failure.  His liver enzymes however were elevated.  I did discuss this with the patient.  I discussed  importance of alcohol cessation.  Will give patient RTS information.   ____________________________________________   FINAL CLINICAL IMPRESSION(S) / ED DIAGNOSES  Final diagnoses:  Alcohol abuse  Elevated liver enzymes     Note: This dictation was prepared with Dragon dictation. Any transcriptional errors that result from this process are unintentional     Nance Pear, MD 10/22/19 2128

## 2019-10-22 NOTE — ED Notes (Signed)
Completed CIWA on patient, no tremors visible to this RN but when asked to hold hands up, patient exhibits extreme tremors. Says this is why he needs Ativan

## 2019-10-22 NOTE — ED Triage Notes (Signed)
Pt comes into the ED via EMS from home with c/o kidney pain for the past 3 months pt is ambulatory triage in NAD, with a steady gait, EMS reports possible ETOH

## 2019-10-22 NOTE — ED Triage Notes (Addendum)
Pt reports bilateral back pain and dark urine x 3 months. Pt reports he needs ativan for seizures due to ETOH withdrawal. Pt was seen here in December for seizures. Pt states he has had no etoh in 2 days because he wanted to stop on his own.

## 2019-11-01 ENCOUNTER — Emergency Department: Payer: Self-pay

## 2019-11-01 ENCOUNTER — Encounter: Payer: Self-pay | Admitting: Emergency Medicine

## 2019-11-01 ENCOUNTER — Inpatient Hospital Stay
Admission: EM | Admit: 2019-11-01 | Discharge: 2019-11-10 | DRG: 922 | Disposition: A | Discharge status: Home / Self Care | Payer: Self-pay | Attending: Internal Medicine | Admitting: Internal Medicine

## 2019-11-01 ENCOUNTER — Other Ambulatory Visit: Payer: Self-pay

## 2019-11-01 DIAGNOSIS — R109 Unspecified abdominal pain: Secondary | ICD-10-CM | POA: Diagnosis not present

## 2019-11-01 DIAGNOSIS — K922 Gastrointestinal hemorrhage, unspecified: Secondary | ICD-10-CM | POA: Diagnosis present

## 2019-11-01 DIAGNOSIS — Z6822 Body mass index (BMI) 22.0-22.9, adult: Secondary | ICD-10-CM

## 2019-11-01 DIAGNOSIS — Z20822 Contact with and (suspected) exposure to covid-19: Secondary | ICD-10-CM | POA: Diagnosis present

## 2019-11-01 DIAGNOSIS — X31XXXA Exposure to excessive natural cold, initial encounter: Secondary | ICD-10-CM

## 2019-11-01 DIAGNOSIS — Z841 Family history of disorders of kidney and ureter: Secondary | ICD-10-CM

## 2019-11-01 DIAGNOSIS — E872 Acidosis: Secondary | ICD-10-CM | POA: Diagnosis present

## 2019-11-01 DIAGNOSIS — G9341 Metabolic encephalopathy: Secondary | ICD-10-CM | POA: Diagnosis present

## 2019-11-01 DIAGNOSIS — F10929 Alcohol use, unspecified with intoxication, unspecified: Secondary | ICD-10-CM

## 2019-11-01 DIAGNOSIS — T68XXXA Hypothermia, initial encounter: Principal | ICD-10-CM | POA: Diagnosis present

## 2019-11-01 DIAGNOSIS — F10939 Alcohol use, unspecified with withdrawal, unspecified: Secondary | ICD-10-CM | POA: Diagnosis present

## 2019-11-01 DIAGNOSIS — R7401 Elevation of levels of liver transaminase levels: Secondary | ICD-10-CM | POA: Diagnosis present

## 2019-11-01 DIAGNOSIS — R4182 Altered mental status, unspecified: Secondary | ICD-10-CM

## 2019-11-01 DIAGNOSIS — D649 Anemia, unspecified: Secondary | ICD-10-CM | POA: Diagnosis not present

## 2019-11-01 DIAGNOSIS — F17210 Nicotine dependence, cigarettes, uncomplicated: Secondary | ICD-10-CM | POA: Diagnosis present

## 2019-11-01 DIAGNOSIS — D61818 Other pancytopenia: Secondary | ICD-10-CM | POA: Diagnosis present

## 2019-11-01 DIAGNOSIS — R509 Fever, unspecified: Secondary | ICD-10-CM | POA: Diagnosis not present

## 2019-11-01 DIAGNOSIS — E861 Hypovolemia: Secondary | ICD-10-CM | POA: Diagnosis present

## 2019-11-01 DIAGNOSIS — D696 Thrombocytopenia, unspecified: Secondary | ICD-10-CM

## 2019-11-01 DIAGNOSIS — I10 Essential (primary) hypertension: Secondary | ICD-10-CM | POA: Diagnosis present

## 2019-11-01 DIAGNOSIS — J9811 Atelectasis: Secondary | ICD-10-CM | POA: Diagnosis present

## 2019-11-01 DIAGNOSIS — R636 Underweight: Secondary | ICD-10-CM | POA: Diagnosis present

## 2019-11-01 DIAGNOSIS — Z79899 Other long term (current) drug therapy: Secondary | ICD-10-CM

## 2019-11-01 DIAGNOSIS — F10229 Alcohol dependence with intoxication, unspecified: Secondary | ICD-10-CM | POA: Diagnosis present

## 2019-11-01 DIAGNOSIS — I9589 Other hypotension: Secondary | ICD-10-CM | POA: Diagnosis present

## 2019-11-01 DIAGNOSIS — R911 Solitary pulmonary nodule: Secondary | ICD-10-CM | POA: Diagnosis present

## 2019-11-01 DIAGNOSIS — F10239 Alcohol dependence with withdrawal, unspecified: Secondary | ICD-10-CM | POA: Diagnosis not present

## 2019-11-01 DIAGNOSIS — R001 Bradycardia, unspecified: Secondary | ICD-10-CM | POA: Diagnosis present

## 2019-11-01 DIAGNOSIS — Y908 Blood alcohol level of 240 mg/100 ml or more: Secondary | ICD-10-CM | POA: Diagnosis present

## 2019-11-01 DIAGNOSIS — L608 Other nail disorders: Secondary | ICD-10-CM | POA: Diagnosis present

## 2019-11-01 DIAGNOSIS — G621 Alcoholic polyneuropathy: Secondary | ICD-10-CM | POA: Diagnosis present

## 2019-11-01 DIAGNOSIS — E876 Hypokalemia: Secondary | ICD-10-CM

## 2019-11-01 DIAGNOSIS — R195 Other fecal abnormalities: Secondary | ICD-10-CM | POA: Diagnosis present

## 2019-11-01 LAB — CBC WITH DIFFERENTIAL/PLATELET
Abs Immature Granulocytes: 0.03 10*3/uL (ref 0.00–0.07)
Basophils Absolute: 0 10*3/uL (ref 0.0–0.1)
Basophils Relative: 1 %
Eosinophils Absolute: 0.2 10*3/uL (ref 0.0–0.5)
Eosinophils Relative: 3 %
HCT: 34.1 % — ABNORMAL LOW (ref 39.0–52.0)
Hemoglobin: 11.4 g/dL — ABNORMAL LOW (ref 13.0–17.0)
Immature Granulocytes: 1 %
Lymphocytes Relative: 12 %
Lymphs Abs: 0.7 10*3/uL (ref 0.7–4.0)
MCH: 31.2 pg (ref 26.0–34.0)
MCHC: 33.4 g/dL (ref 30.0–36.0)
MCV: 93.4 fL (ref 80.0–100.0)
Monocytes Absolute: 0.6 10*3/uL (ref 0.1–1.0)
Monocytes Relative: 10 %
Neutro Abs: 4.3 10*3/uL (ref 1.7–7.7)
Neutrophils Relative %: 73 %
Platelets: 189 10*3/uL (ref 150–400)
RBC: 3.65 MIL/uL — ABNORMAL LOW (ref 4.22–5.81)
RDW: 13.9 % (ref 11.5–15.5)
WBC: 5.7 10*3/uL (ref 4.0–10.5)
nRBC: 0 % (ref 0.0–0.2)

## 2019-11-01 LAB — URINE DRUG SCREEN, QUALITATIVE (ARMC ONLY)
Amphetamines, Ur Screen: NOT DETECTED
Barbiturates, Ur Screen: NOT DETECTED
Benzodiazepine, Ur Scrn: NOT DETECTED
Cannabinoid 50 Ng, Ur ~~LOC~~: NOT DETECTED
Cocaine Metabolite,Ur ~~LOC~~: NOT DETECTED
MDMA (Ecstasy)Ur Screen: NOT DETECTED
Methadone Scn, Ur: NOT DETECTED
Opiate, Ur Screen: NOT DETECTED
Phencyclidine (PCP) Ur S: NOT DETECTED
Tricyclic, Ur Screen: NOT DETECTED

## 2019-11-01 LAB — COMPREHENSIVE METABOLIC PANEL
ALT: 61 U/L — ABNORMAL HIGH (ref 0–44)
AST: 160 U/L — ABNORMAL HIGH (ref 15–41)
Albumin: 3.2 g/dL — ABNORMAL LOW (ref 3.5–5.0)
Alkaline Phosphatase: 159 U/L — ABNORMAL HIGH (ref 38–126)
Anion gap: 15 (ref 5–15)
BUN: 13 mg/dL (ref 6–20)
CO2: 22 mmol/L (ref 22–32)
Calcium: 8.1 mg/dL — ABNORMAL LOW (ref 8.9–10.3)
Chloride: 96 mmol/L — ABNORMAL LOW (ref 98–111)
Creatinine, Ser: 0.68 mg/dL (ref 0.61–1.24)
GFR calc Af Amer: >60 mL/min (ref >60)
GFR calc non Af Amer: >60 mL/min (ref >60)
Glucose, Bld: 176 mg/dL — ABNORMAL HIGH (ref 70–99)
Potassium: 3.8 mmol/L (ref 3.5–5.1)
Sodium: 133 mmol/L — ABNORMAL LOW (ref 135–145)
Total Bilirubin: 1.1 mg/dL (ref 0.3–1.2)
Total Protein: 7.5 g/dL (ref 6.5–8.1)

## 2019-11-01 LAB — MAGNESIUM: Magnesium: 2.1 mg/dL (ref 1.7–2.4)

## 2019-11-01 LAB — URINALYSIS, COMPLETE (UACMP) WITH MICROSCOPIC
Bacteria, UA: NONE SEEN
Bilirubin Urine: NEGATIVE
Glucose, UA: NEGATIVE mg/dL
Hgb urine dipstick: NEGATIVE
Ketones, ur: NEGATIVE mg/dL
Leukocytes,Ua: NEGATIVE
Nitrite: NEGATIVE
Protein, ur: NEGATIVE mg/dL
Specific Gravity, Urine: 1.002 — ABNORMAL LOW (ref 1.005–1.030)
Squamous Epithelial / HPF: NONE SEEN (ref 0–5)
WBC, UA: NONE SEEN WBC/hpf (ref 0–5)
pH: 5 (ref 5.0–8.0)

## 2019-11-01 LAB — AMMONIA: Ammonia: 12 umol/L (ref 9–35)

## 2019-11-01 LAB — CK: Total CK: 248 U/L (ref 49–397)

## 2019-11-01 LAB — PROCALCITONIN: Procalcitonin: 0.56 ng/mL

## 2019-11-01 LAB — RESPIRATORY PANEL BY RT PCR (FLU A&B, COVID)
Influenza A by PCR: NEGATIVE
Influenza B by PCR: NEGATIVE
SARS Coronavirus 2 by RT PCR: NEGATIVE

## 2019-11-01 LAB — LACTIC ACID, PLASMA
Lactic Acid, Venous: 6 mmol/L (ref 0.5–1.9)
Lactic Acid, Venous: 4.9 mmol/L (ref 0.5–1.9)

## 2019-11-01 LAB — ETHANOL: Alcohol, Ethyl (B): 467 mg/dL (ref <10)

## 2019-11-01 LAB — TROPONIN I (HIGH SENSITIVITY)
Troponin I (High Sensitivity): 13 ng/L (ref <18)
Troponin I (High Sensitivity): 12 ng/L (ref <18)

## 2019-11-01 LAB — LIPASE, BLOOD: Lipase: 64 U/L — ABNORMAL HIGH (ref 11–51)

## 2019-11-01 LAB — GLUCOSE, CAPILLARY: Glucose-Capillary: 85 mg/dL (ref 70–99)

## 2019-11-01 MED ORDER — SODIUM CHLORIDE 0.9 % IV SOLN
1.0000 g | INTRAVENOUS | Status: DC
  Administered 2019-11-01 – 2019-11-02 (×2): 1 g via INTRAVENOUS
  Filled 2019-11-01 (×3): qty 10

## 2019-11-01 MED ORDER — ONDANSETRON HCL 4 MG/2ML IJ SOLN
4.0000 mg | Freq: Four times a day (QID) | INTRAMUSCULAR | Status: DC | PRN
  Administered 2019-11-04 – 2019-11-05 (×2): 4 mg via INTRAVENOUS
  Filled 2019-11-01 (×3): qty 2

## 2019-11-01 MED ORDER — ENOXAPARIN SODIUM 40 MG/0.4ML ~~LOC~~ SOLN
40.0000 mg | SUBCUTANEOUS | Status: DC
  Administered 2019-11-01 – 2019-11-02 (×2): 40 mg via SUBCUTANEOUS
  Filled 2019-11-01 (×2): qty 0.4

## 2019-11-01 MED ORDER — SODIUM CHLORIDE 0.9 % IV SOLN
0.0000 ug/min | INTRAVENOUS | Status: DC
  Administered 2019-11-02: 20 ug/min via INTRAVENOUS
  Administered 2019-11-02: 50 ug/min via INTRAVENOUS
  Administered 2019-11-02: 05:00:00 60 ug/min via INTRAVENOUS
  Filled 2019-11-01: qty 10
  Filled 2019-11-01 (×2): qty 1

## 2019-11-01 MED ORDER — PANTOPRAZOLE SODIUM 40 MG IV SOLR
40.0000 mg | INTRAVENOUS | Status: DC
  Administered 2019-11-01 – 2019-11-02 (×2): 40 mg via INTRAVENOUS
  Filled 2019-11-01 (×2): qty 40

## 2019-11-01 MED ORDER — ACETAMINOPHEN 325 MG PO TABS
650.0000 mg | ORAL_TABLET | ORAL | Status: DC | PRN
  Administered 2019-11-06 – 2019-11-09 (×3): 650 mg via ORAL
  Filled 2019-11-01 (×3): qty 2

## 2019-11-01 MED ORDER — SODIUM CHLORIDE 0.9 % IV SOLN: INTRAVENOUS | Status: DC

## 2019-11-01 MED ORDER — SODIUM CHLORIDE 0.9 % IV BOLUS
1000.0000 mL | Freq: Once | INTRAVENOUS | Status: AC
  Administered 2019-11-01: 1000 mL via INTRAVENOUS

## 2019-11-01 MED ORDER — SODIUM CHLORIDE 0.9 % IV BOLUS
1000.0000 mL | Freq: Once | INTRAVENOUS | Status: AC
  Administered 2019-11-01: 19:00:00 1000 mL via INTRAVENOUS

## 2019-11-01 NOTE — H&P (Signed)
Name: Terry Clark MRN: [id number] DOB: [birth year]    ADMISSION DATE:  11/01/2019 CONSULTATION DATE:  11/01/2019  REFERRING MD :  Dr. Charna Archer  CHIEF COMPLAINT:  Hypothermia  BRIEF PATIENT DESCRIPTION:  49 year old male admitted 11/01/2019 due to severe hypothermia and altered mental status secondary to alcohol intoxication.  During rewarming he has become hypotensive requiring Neo-Synephrine drip.  Sepsis work-up in progress.  Chest x-ray with potential cavitary lesion in the left midlung zone, CT chest pending.  SIGNIFICANT EVENTS  2/20-admission ICU 2/20-hypotensive with rewarming, requiring Neo-Synephrine  STUDIES:  2/20-chest x-ray>>There is no pneumothorax or large pleural effusion. The heart size is normal. There is suggestion of a potentially cavitary 1.5 cm lesion in the left mid lung zone. This may represent artifact from overlapping osseous structures, however a true pulmonary lesion is difficult to entirely exclude. There is no acute osseous Abnormality. 2/21-CT chest>>  CULTURES: SARS-CoV-2 PCR 2/20>> negative Influenza PCR 2/20>> negative Blood culture x2 2/20>> Strep pneumo urinary antigen 2/20>> Legionella urinary antigen 2/20>>  ANTIBIOTICS: Ceftriaxone 2/20>>  HISTORY OF PRESENT ILLNESS:   Terry Clark is a 49 year old male with a past medical history notable for alcohol abuse and alcohol withdrawal seizures who presented to [facility] ED on 11/01/2019 due to severe hypothermia.  Patient is currently altered, therefore history is obtained from ED and nursing notes.  Per notes, EMS was called as the patient was found behind a convenience store, cold and wet while sitting in the mud.  Per EMS his rectal temp was 86.2 which they removed his clothing and attempted rewarming with heating packs.  He was alert during transport but remained altered and was unable to provide any significant history. No signs of head trauma.  Upon arrival to the ED his temperature was noted to  be 84.9, of which active external warming was initiated with bear hugger and also initiation of warm IV fluids.  Urinalysis was negative for UTI, and chest x-ray with potential cavitary lesion in the left midlung zone, .  Blood work revealed sodium 133, chloride 96, glucose 176, alkaline phosphatase 159, lipase 64, AST 160, ALT 61, CK 248, lactic acid 6.0, WBC 5.7, procalcitonin 0.56, hemoglobin 11.4, high-sensitivity troponin 12.  Serum ethyl alcohol is 467.  His COVID-19 PCR is negative, urinalysis is negative for UTI.  Urine drug screen is negative.  PCCM is asked to admit the patient for further work-up and treatment of hypothermia secondary to cold exposure, altered mental status in the setting of alcohol intoxication, and hypotension in the setting of rewarming versus questionable developing sepsis.  CT chest is currently pending.  PAST MEDICAL HISTORY :   has a past medical history of Alcohol abuse and Alcohol withdrawal seizure ([place]).  has a past surgical history that includes Tonsillectomy and Esophagogastroduodenoscopy (N/A, 09/18/2018). Prior to Admission medications   Medication Sig Start Date End Date Taking? Authorizing Provider  amLODipine (NORVASC) 10 MG tablet Take 1 tablet (10 mg total) by mouth daily. 06/02/19   Dustin Flock, MD  chlordiazePOXIDE (LIBRIUM) 25 MG capsule Take 1 tablet by mouth 5 times a day on day 1, then decrease by 1 tablet daily until gone. 06/18/19   Earleen Newport, MD  diazepam (VALIUM) 2 MG tablet Take 1 tablet (2 mg total) by mouth every 8 (eight) hours as needed for anxiety (tremmors). 06/02/19 06/01/20  Dustin Flock, MD  famotidine (PEPCID) 20 MG tablet Take 1 tablet (20 mg total) by mouth 2 (two) times daily. 06/18/19   Lenise Arena  E, MD  folic acid (FOLVITE) 1 MG tablet Take 1 tablet (1 mg total) by mouth daily. 06/02/19   Auburn Bilberry, MD  Multiple Vitamin (MULTIVITAMIN WITH MINERALS) TABS tablet Take 1 tablet by mouth daily. 06/02/19   Auburn Bilberry, MD  thiamine 100 MG tablet Take 1 tablet (100 mg total) by mouth daily. 06/02/19   Auburn Bilberry, MD   No Known Allergies  FAMILY HISTORY:  family history includes Cancer in an other family member; Kidney failure in his mother. SOCIAL HISTORY:  reports that he has been smoking. He has been smoking about 1.00 pack per day. He has never used smokeless tobacco. He reports current alcohol use. He reports previous drug use.   COVID-19 DISASTER DECLARATION:  FULL CONTACT PHYSICAL EXAMINATION WAS NOT POSSIBLE DUE TO TREATMENT OF COVID-19 AND  CONSERVATION OF PERSONAL PROTECTIVE EQUIPMENT, LIMITED EXAM FINDINGS INCLUDE-  Patient assessed or the symptoms described in the history of present illness.  In the context of the Global COVID-19 pandemic, which necessitated consideration that the patient might be at risk for infection with the SARS-CoV-2 virus that causes COVID-19, Institutional protocols and algorithms that pertain to the evaluation of patients at risk for COVID-19 are in a state of rapid change based on information released by regulatory bodies including the CDC and federal and state organizations. These policies and algorithms were followed during the patient's care while in hospital.  REVIEW OF SYSTEMS:  Unable to assess due to AMS & critical illness  SUBJECTIVE:  Unable to assess due to AMS & Critical illness  VITAL SIGNS: Temp:  [84.9 F (29.4 C)-87.1 F (30.6 C)] 87.1 F (30.6 C) (02/20 2030) Pulse Rate:  [41-56] 41 (02/20 2030) Resp:  [12-18] 17 (02/20 2030) BP: (137-146)/(56-88) 146/88 (02/20 2030) SpO2:  [97 %-100 %] 100 % (02/20 2030) Weight:  [63.5 kg] 63.5 kg (02/20 1902)  PHYSICAL EXAMINATION: General:  Acutely ill appearing male, laying in bed, asleep, on room air, in NAD Neuro:  Lethargic with periods of intermittent alertness (intoxicated) HEENT:  Atraumatic, normocephalic, neck supple, no JVD Cardiovascular:  Regular rate and rhythm, s1s2, no  M/R/G Lungs:  Clear to auscultation bilaterally, even, nonlabored, normal effort Abdomen:  Soft, nontender, nondistended, no guarding or rebound tenderness, BS+ x4 Musculoskeletal:  Normal bulk and tone, no deformities Skin:  Cool and dry.  No obvious rashes, lesions, or ulcerations  Recent Labs  Lab 11/01/19 1906  NA 133*  K 3.8  CL 96*  CO2 22  BUN 13  CREATININE 0.68  GLUCOSE 176*   Recent Labs  Lab 11/01/19 1906  HGB 11.4*  HCT 34.1*  WBC 5.7  PLT 189   DG Chest Portable 1 View  Result Date: 11/01/2019 CLINICAL DATA:  Altered mental status. EXAM: PORTABLE CHEST 1 VIEW COMPARISON:  August 20, 2018 FINDINGS: There is no pneumothorax or large pleural effusion. The heart size is normal. There is suggestion of a potentially cavitary 1.5 cm lesion in the left mid lung zone. This may represent artifact from overlapping osseous structures, however a true pulmonary lesion is difficult to entirely exclude. There is no acute osseous abnormality. IMPRESSION: Possible cavitary 1.5 cm lesion in the left mid lung zone. Consider further evaluation with chest CT, preferably with contrast. No pneumothorax or large pleural effusion. Electronically Signed   By: Katherine Mantle M.D.   On: 11/01/2019 19:37    ASSESSMENT / PLAN:  Hypothermia secondary to cold exposure -Continuous cardiac monitoring -Monitor for arrhthymias, bradycardia -Monitor for hypotension  during rewarming -Goal rate of warming 0.5 - 2 degrees Celsius/hr -Warming blanket and warmed IV fluids  Hypotension, likely in the setting of rewarming vs. Developing sepsis (? Lung cavitary lesion) Lactic acidosis -Continuous cardiac monitoring -Maintain MAP greater than 65 -IV fluids -Neo-Synephrine as needed to maintain map goal -Trend lactic acid until normalized -Urinalysis is negative,  Chest x-ray with potential cavitary lesion in the left midlung zone, sepsis work-up in progress -Follow cultures as above -Placed on  empiric ceftriaxone per Dr. Birder Robson recommendation  Altered mental status secondary to ETOH intoxication -Provide supportive care -Frequent neuro checks (pt with intermittent periods of alertness followed by periods of somnolence) -Avoid sedating medications as able -Currently maintaining his airway -Banana bag (thiamine, folate, multivitamin) -Monitor for signs of withdrawal -Monitor for seizures -Urine drug screen is negative -No signs of head trauma -Consider Head CT if no improvement in mentation  Pulmonary cavitary lesion in Left mid lung zone on CXR -Supplemental O2 as needed to maintain O2 sats greater than 92% -Follow intermittent ABG and chest x-ray as needed -Placed on empiric Rocephin -Discussed with Dr. Jayme Cloud, given location of lesion, low likelihood of TB -We will obtain CT chest once patient stabilized  Mild transaminitis, likely secondary to alcohol abuse -N.p.o. for now until mental status improved -Trend LFTs -Protonix IV for stress ulcer prophylaxis  Anemia without signs or symptoms of bleeding -Monitor for S/Sx of bleeding -Trend CBC -Subcu Lovenox for VTE Prophylaxis  -Transfuse for Hgb <7         Patient is currently critically ill, prognosis is guarded, he is high risk for cardiac arrest and death   Disposition: ICU Goals of care: Full code VTE prophylaxis: Subcu Lovenox Stress ulcer prophylaxis: IV Protonix Updates: Patient with altered mental status, and no family available for update 11/01/2019   Harlon Ditty, [facility] [place] [facility] Pager: [phone]  11/01/2019, 9:31 PM

## 2019-11-01 NOTE — ED Triage Notes (Addendum)
Pt arrived via [facility]. Pt was found by PD behind convenience store wet sitting in mud.  Pt was found by homeless person behind the store who called 911. Pt found by EMS to have rectal temp of 86.2.  Other vitals were stable.   On EMS arrival, to was alert to self and asking if he is going to die.   On arrival to ED pt alert and able to state his name.

## 2019-11-01 NOTE — ED Notes (Signed)
Sitter at bedside.

## 2019-11-01 NOTE — ED Notes (Signed)
Pt more alert at this time. Pt requesting snacks at this time. Per MD no snacks at this time.

## 2019-11-01 NOTE — ED Notes (Signed)
April B RN collected first set of blood cultures.

## 2019-11-01 NOTE — ED Provider Notes (Signed)
[facility] Emergency Department Provider Note   ____________________________________________   First MD Initiated Contact with Patient 11/01/19 1855     (approximate)  I have reviewed the triage vital signs and the nursing notes.   HISTORY  Chief Complaint Cold Exposure    HPI Terry Clark is a 49 y.o. male with possible history of alcohol abuse who presents to the ED for hypothermia.  History is limited due to patient's altered mental status.  Per EMS, he was found behind a convention in store sitting in wet and cold mud and wearing wet clothes.  He was found by another individual, who notified the store, who then called EMS.  EMS found patient's rectal temp to be 86.2 and they removed his cold and wet close, attempted rewarming with heating packs.  Patient was awake and alert during transport but unable to provide significant history.        Past Medical History:  Diagnosis Date  . Alcohol abuse   . Alcohol withdrawal seizure [facility])     Patient Active Problem List   Diagnosis Date Noted  . Hypothermia 11/01/2019  . 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 Alcohol withdrawal ([place]) 02/26/2019  . Hematemesis   . GI bleed 09/17/2018  . Alcoholic intoxication with complication ([place])   . Anger   . Subconjunctival hemorrhage of both eyes   . Alcohol withdrawal seizure ([place]) 06/13/2014  . Alcoholic hepatitis 62/70/3500    Past Surgical History:  Procedure Laterality Date  . ESOPHAGOGASTRODUODENOSCOPY N/A 09/18/2018   Procedure: ESOPHAGOGASTRODUODENOSCOPY (EGD);  Surgeon: Lin Landsman, MD;  Location: [facility] ENDOSCOPY;  Service: Gastroenterology;  Laterality: N/A;  . TONSILLECTOMY      Prior to Admission medications   Medication Sig Start Date End Date Taking? Authorizing Provider  amLODipine (NORVASC) 10 MG tablet Take 1 tablet (10 mg total) by mouth daily. 06/02/19   Dustin Flock, MD  chlordiazePOXIDE (LIBRIUM) 25 MG capsule Take 1 tablet by mouth 5 times a day on day 1, then decrease by 1 tablet daily until gone. 06/18/19   Earleen Newport, MD  diazepam (VALIUM) 2 MG tablet Take 1 tablet (2 mg total) by mouth every 8 (eight) hours as needed for anxiety (tremmors). 06/02/19 06/01/20  Dustin Flock, MD  famotidine (PEPCID) 20 MG tablet Take 1 tablet (20 mg total) by mouth 2 (two) times daily. 06/18/19   Earleen Newport, MD  folic acid (FOLVITE) 1 MG tablet Take 1 tablet (1 mg total) by mouth daily. 06/02/19   Dustin Flock, MD  Multiple Vitamin (MULTIVITAMIN WITH MINERALS) TABS tablet Take 1 tablet by mouth daily. 06/02/19   Dustin Flock, MD  thiamine 100 MG tablet Take 1 tablet (100 mg total) by mouth daily. 06/02/19   Dustin Flock, MD    Allergies Patient has no known allergies.  Family History  Problem Relation Age of Onset  . Kidney failure Mother   . Cancer Other     Social History Social History   Tobacco Use  . Smoking status: Current Every Day Smoker    Packs/day: 1.00  . Smokeless tobacco: Never Used  Substance Use Topics  . Alcohol use: Yes    Comment: case of beer and fifth of liquor daily  . Drug use: Not Currently    Review of Systems Unable to obtain secondary to altered mental status  ____________________________________________   PHYSICAL EXAM:  VITAL SIGNS: ED Triage Vitals [11/01/19  1855]  Enc Vitals Group     BP      Pulse      Resp      Temp      Temp src      SpO2 98 %     Weight      Height      Head Circumference      Peak Flow      Pain Score      Pain Loc      Pain Edu?      Excl. in GC?     Constitutional: Awake and alert. Eyes: Conjunctivae are normal. Head: Atraumatic. Nose: No congestion/rhinnorhea. Mouth/Throat: Mucous membranes are dry. Neck: Normal ROM Cardiovascular: Normal rate, regular rhythm. Grossly normal heart sounds. Respiratory: Normal respiratory effort.  No  retractions. Lungs CTAB. Gastrointestinal: Soft and nontender. No distention. Genitourinary: deferred Musculoskeletal: No lower extremity tenderness nor edema. Neurologic: Garbled and incoherent speech.  No gross focal neurologic deficits are appreciated. Skin:  Skin is cool to touch, dry and intact. No rash noted. Psychiatric: Mood and affect are normal.  ____________________________________________   LABS (all labs ordered are listed, but only abnormal results are displayed)  Labs Reviewed  CBC WITH DIFFERENTIAL/PLATELET - Abnormal; Notable for the following components:      Result Value   RBC 3.65 (*)    Hemoglobin 11.4 (*)    HCT 34.1 (*)    All other components within normal limits  COMPREHENSIVE METABOLIC PANEL - Abnormal; Notable for the following components:   Sodium 133 (*)    Chloride 96 (*)    Glucose, Bld 176 (*)    Calcium 8.1 (*)    Albumin 3.2 (*)    AST 160 (*)    ALT 61 (*)    Alkaline Phosphatase 159 (*)    All other components within normal limits  URINALYSIS, COMPLETE (UACMP) WITH MICROSCOPIC - Abnormal; Notable for the following components:   Color, Urine STRAW (*)    APPearance CLEAR (*)    Specific Gravity, Urine 1.002 (*)    All other components within normal limits  LIPASE, BLOOD - Abnormal; Notable for the following components:   Lipase 64 (*)    All other components within normal limits  LACTIC ACID, PLASMA - Abnormal; Notable for the following components:   Lactic Acid, Venous 6.0 (*)    All other components within normal limits  LACTIC ACID, PLASMA - Abnormal; Notable for the following components:   Lactic Acid, Venous 4.9 (*)    All other components within normal limits  ETHANOL - Abnormal; Notable for the following components:   Alcohol, Ethyl (B) 467 (*)    All other components within normal limits  RESPIRATORY PANEL BY RT PCR (FLU A&B, COVID)  CULTURE, BLOOD (ROUTINE X 2)  CULTURE, BLOOD (ROUTINE X 2)  URINE DRUG SCREEN, QUALITATIVE  ([facility] ONLY)  MAGNESIUM  AMMONIA  CK  PROCALCITONIN  PROCALCITONIN  STREP PNEUMONIAE URINARY ANTIGEN  LEGIONELLA PNEUMOPHILA SEROGP 1 UR AG  HIV ANTIBODY (ROUTINE TESTING W REFLEX)  CBC  COMPREHENSIVE METABOLIC PANEL  TROPONIN I (HIGH SENSITIVITY)  TROPONIN I (HIGH SENSITIVITY)   ____________________________________________  EKG  ED ECG REPORT I, Chesley Noon, the attending physician, personally viewed and interpreted this ECG.   Date: 11/01/2019  EKG Time: 19:05  Rate: 50's  Rhythm: sinus bradycardia  Axis: Normal  Intervals:none  ST&T Change: None   PROCEDURES  Procedure(s) performed (including Critical Care):  .Critical Care Performed by: Chesley Noon,  MD Authorized by: Chesley Noon, MD   Critical care provider statement:    Critical care time (minutes):  45   Critical care time was exclusive of:  Separately billable procedures and treating other patients and teaching time   Critical care was necessary to treat or prevent imminent or life-threatening deterioration of the following conditions:  Metabolic crisis   Critical care was time spent personally by me on the following activities:  Discussions with consultants, evaluation of patient's response to treatment, examination of patient, ordering and performing treatments and interventions, ordering and review of laboratory studies, ordering and review of radiographic studies, pulse oximetry, re-evaluation of patient's condition, obtaining history from patient or surrogate and review of old charts   I assumed direction of critical care for this patient from another provider in my specialty: no       ____________________________________________   INITIAL IMPRESSION / ASSESSMENT AND PLAN / ED COURSE       49 year old male with history of alcohol abuse presents to the ED after being found confused and sitting in cold, noted to have core temp of 86.2 prior to arrival.  For temp measured here just after  arrival is 84.9 and active external warming was initiated with bear hugger, will also start warm IV fluids and irrigate bladder with warm fluids.  Plan to check labs and assess for sepsis and possible infectious process.  UA and chest x-ray without evidence of infectious process.  Lab work significant for very high blood alcohol as well as lactic acidosis, patient with no abdominal pain or tenderness on reevaluation.  He does have a mild transaminitis, likely secondary to alcohol abuse, which is improved from prior assessments.  His temperature and mental status seem to be gradually improving, but given severe hypothermia, case was discussed with eICU attending, who accepts patient for admission.      ____________________________________________   FINAL CLINICAL IMPRESSION(S) / ED DIAGNOSES  Final diagnoses:  Hypothermia, initial encounter  Alcoholic intoxication with complication (HCC)  Altered mental status, unspecified altered mental status type     ED Discharge Orders    None       Note:  This document was prepared using Dragon voice recognition software and may include unintentional dictation errors.   Chesley Noon, MD 11/01/19 2240

## 2019-11-02 ENCOUNTER — Encounter: Payer: Self-pay | Admitting: Pulmonary Disease

## 2019-11-02 ENCOUNTER — Inpatient Hospital Stay: Payer: Self-pay

## 2019-11-02 ENCOUNTER — Other Ambulatory Visit: Payer: Self-pay

## 2019-11-02 LAB — COMPREHENSIVE METABOLIC PANEL
ALT: 50 U/L — ABNORMAL HIGH (ref 0–44)
AST: 145 U/L — ABNORMAL HIGH (ref 15–41)
Albumin: 2.5 g/dL — ABNORMAL LOW (ref 3.5–5.0)
Alkaline Phosphatase: 119 U/L (ref 38–126)
Anion gap: 12 (ref 5–15)
BUN: 11 mg/dL (ref 6–20)
CO2: 21 mmol/L — ABNORMAL LOW (ref 22–32)
Calcium: 7 mg/dL — ABNORMAL LOW (ref 8.9–10.3)
Chloride: 108 mmol/L (ref 98–111)
Creatinine, Ser: 0.66 mg/dL (ref 0.61–1.24)
GFR calc Af Amer: >60 mL/min (ref >60)
GFR calc non Af Amer: >60 mL/min (ref >60)
Glucose, Bld: 76 mg/dL (ref 70–99)
Potassium: 3.6 mmol/L (ref 3.5–5.1)
Sodium: 141 mmol/L (ref 135–145)
Total Bilirubin: 1 mg/dL (ref 0.3–1.2)
Total Protein: 5.6 g/dL — ABNORMAL LOW (ref 6.5–8.1)

## 2019-11-02 LAB — CBC
HCT: 25.8 % — ABNORMAL LOW (ref 39.0–52.0)
Hemoglobin: 8.8 g/dL — ABNORMAL LOW (ref 13.0–17.0)
MCH: 31.8 pg (ref 26.0–34.0)
MCHC: 34.1 g/dL (ref 30.0–36.0)
MCV: 93.1 fL (ref 80.0–100.0)
Platelets: 157 10*3/uL (ref 150–400)
RBC: 2.77 MIL/uL — ABNORMAL LOW (ref 4.22–5.81)
RDW: 14 % (ref 11.5–15.5)
WBC: 5 10*3/uL (ref 4.0–10.5)
nRBC: 0 % (ref 0.0–0.2)

## 2019-11-02 LAB — STREP PNEUMONIAE URINARY ANTIGEN: Strep Pneumo Urinary Antigen: NEGATIVE

## 2019-11-02 LAB — LACTIC ACID, PLASMA
Lactic Acid, Venous: 3.2 mmol/L (ref 0.5–1.9)
Lactic Acid, Venous: 2.3 mmol/L (ref 0.5–1.9)

## 2019-11-02 LAB — MAGNESIUM: Magnesium: 1.6 mg/dL — ABNORMAL LOW (ref 1.7–2.4)

## 2019-11-02 LAB — PROCALCITONIN: Procalcitonin: 0.42 ng/mL

## 2019-11-02 MED ORDER — IOHEXOL 300 MG/ML  SOLN
75.0000 mL | Freq: Once | INTRAMUSCULAR | Status: AC | PRN
  Administered 2019-11-02: 11:00:00 75 mL via INTRAVENOUS

## 2019-11-02 MED ORDER — CHLORHEXIDINE GLUCONATE CLOTH 2 % EX PADS
6.0000 | MEDICATED_PAD | Freq: Every day | CUTANEOUS | Status: DC
  Administered 2019-11-02 – 2019-11-07 (×6): 6 via TOPICAL

## 2019-11-02 MED ORDER — SODIUM CHLORIDE 0.9 % IV SOLN
25.0000 ug/min | INTRAVENOUS | Status: DC
  Administered 2019-11-02: 09:00:00 50 ug/min via INTRAVENOUS
  Filled 2019-11-02 (×4): qty 1

## 2019-11-02 MED ORDER — PHENYLEPHRINE HCL-NACL 10-0.9 MG/250ML-% IV SOLN
25.0000 ug/min | INTRAVENOUS | Status: DC
  Filled 2019-11-02: qty 250

## 2019-11-02 MED ORDER — THIAMINE HCL 100 MG/ML IJ SOLN
500.0000 mg | Freq: Every day | INTRAVENOUS | Status: AC
  Administered 2019-11-02 – 2019-11-06 (×5): 500 mg via INTRAVENOUS
  Filled 2019-11-02 (×5): qty 5

## 2019-11-02 MED ORDER — DEXMEDETOMIDINE HCL IN NACL 400 MCG/100ML IV SOLN: 0.2000 ug/kg/h | INTRAVENOUS | Status: DC

## 2019-11-02 MED ORDER — PHENOBARBITAL 32.4 MG PO TABS
64.8000 mg | ORAL_TABLET | Freq: Three times a day (TID) | ORAL | Status: AC
  Administered 2019-11-02 – 2019-11-03 (×5): 64.8 mg via ORAL
  Filled 2019-11-02 (×5): qty 2

## 2019-11-02 MED ORDER — THIAMINE HCL 100 MG/ML IJ SOLN
Freq: Once | INTRAVENOUS | Status: AC
  Filled 2019-11-02: qty 1000

## 2019-11-02 MED ORDER — DEXTROSE IN LACTATED RINGERS 5 % IV SOLN
INTRAVENOUS | Status: DC
  Administered 2019-11-03: 900 mL via INTRAVENOUS

## 2019-11-02 MED ORDER — LORAZEPAM 2 MG/ML IJ SOLN
1.0000 mg | INTRAMUSCULAR | Status: DC | PRN
  Administered 2019-11-02 – 2019-11-05 (×22): 2 mg via INTRAVENOUS
  Filled 2019-11-02 (×22): qty 1

## 2019-11-02 MED ORDER — SODIUM CHLORIDE 0.9 % IV SOLN
250.0000 mL | INTRAVENOUS | Status: DC
  Administered 2019-11-09: 09:00:00 250 mL via INTRAVENOUS

## 2019-11-02 MED ORDER — NOREPINEPHRINE 4 MG/250ML-% IV SOLN: 2.0000 ug/min | INTRAVENOUS | Status: DC

## 2019-11-02 MED ORDER — PHENOBARBITAL 32.4 MG PO TABS
32.4000 mg | ORAL_TABLET | Freq: Two times a day (BID) | ORAL | Status: AC
  Administered 2019-11-05 (×2): 32.4 mg via ORAL
  Filled 2019-11-02 (×2): qty 1

## 2019-11-02 MED ORDER — PHENOBARBITAL 32.4 MG PO TABS
64.8000 mg | ORAL_TABLET | Freq: Two times a day (BID) | ORAL | Status: AC
  Administered 2019-11-04 (×2): 64.8 mg via ORAL
  Filled 2019-11-02 (×2): qty 2

## 2019-11-02 MED ORDER — THIAMINE HCL 100 MG/ML IJ SOLN
100.0000 mg | Freq: Every day | INTRAMUSCULAR | Status: DC
  Administered 2019-11-02: 100 mg via INTRAVENOUS
  Filled 2019-11-02: qty 2

## 2019-11-02 NOTE — Progress Notes (Signed)
eLink Physician-Brief Progress Note Patient Name: PRINCE COUEY DOB: 1971-04-30 MRN: [id number]   Date of Service  11/02/2019  HPI/Events of Note  1M with hx of EtOH abuse and withdrawal seizures who was admitted to the ICU via [facility] ED for severe hypothermia (85.8 F) c/b bradycardia to 40s, elevated lactic acid level, and hypotension which developed during rewarming with warm IVF and warming blanket. EtOH level on admission was 467.  His hypotension has been treated with a phenylephrine drip with good effect. He has been rewarmed without any difficulty.  Lactic acid has also downtrended nicely.   eICU Interventions  # Neuro: - Allow EtOH to be metabolized and mental status to improve. - If the patient develops signs of EtOH withdrawal, consider using a single-dose phenobarbital load which will self-taper, rather than a CIWA/benzodiazepine protocol. - Thiamine 100mg  IV daily x3 days. Already received banana bag.  # Endocrine: - Rewarming with warm IVFs and bair hugger.  # Respiratory: - CT Chest to evaluate incidental finding of possible cavitary lesion in the left lung. - Wean [place] as tolerated.  # Cardiac: - Wean Neo drip as tolerated, maintaining MAP >\= 65 mmHg. Hypotension is likely a result of vasodilatory effects of rewarming of extremities.  # GI: - NPO until no longer intoxicated.  # ID: - Ceftriaxone empirically for now. Can likely d/c this if ongoing low suspicion for infection in AM.  DVT PPX: Lovenox [place] (ppx dosing) GI PPX: Not indicated. Code Status: Full Code     Intervention Category Evaluation Type: New Patient Evaluation  11/02/2019, 4:59 AM

## 2019-11-02 NOTE — Progress Notes (Signed)
Patient noted to have redness and swelling to left knee and left shoulder upon admisiion

## 2019-11-02 NOTE — Progress Notes (Addendum)
Follow up - Critical Care Medicine Note  Patient Details:    Terry Clark is an 49 y.o. male admitted 11/01/2019 due to severe hypothermia and altered mental status secondary to alcohol intoxication.  During rewarming he has become hypotensive requiring Neo-Synephrine drip.  Sepsis work-up in progress.  Chest x-ray with potential cavitary lesion in the left midlung zone, CT chest pending.  Lines, Airways, Drains:    Anti-infectives:  Anti-infectives (From admission, onward)   Start     Dose/Rate Route Frequency Ordered Stop   11/01/19 2130  cefTRIAXone (ROCEPHIN) 1 g in sodium chloride 0.9 % 100 mL IVPB     1 g 200 mL/hr over 30 Minutes Intravenous Every 24 hours 11/01/19 2116        Microbiology: Results for orders placed or performed during the hospital encounter of 11/01/19  Culture, blood (routine x 2)     Status: None (Preliminary result)   Collection Time: 11/01/19  6:55 PM   Specimen: BLOOD  Result Value Ref Range Status   Specimen Description BLOOD RIGHT ANTECUBITAL  Final   Special Requests   Final    BOTTLES DRAWN AEROBIC AND ANAEROBIC Blood Culture results may not be optimal due to an excessive volume of blood received in culture bottles   Culture   Final    NO GROWTH < 12 HOURS Performed at [facility], [address], [place], [place] [address]    Report Status PENDING  Incomplete  Culture, blood (routine x 2)     Status: None (Preliminary result)   Collection Time: 11/01/19  7:15 PM   Specimen: BLOOD  Result Value Ref Range Status   Specimen Description BLOOD LEFT ANTECUBITAL  Final   Special Requests   Final    BOTTLES DRAWN AEROBIC AND ANAEROBIC Blood Culture results may not be optimal due to an excessive volume of blood received in culture bottles   Culture   Final    NO GROWTH < 12 HOURS Performed at [facility], [address], [place], [place] [address]    Report Status PENDING  Incomplete  Respiratory Panel by RT PCR (Flu A&B,  Covid) - Nasopharyngeal Swab     Status: None   Collection Time: 11/01/19  7:15 PM   Specimen: Nasopharyngeal Swab  Result Value Ref Range Status   SARS Coronavirus 2 by RT PCR NEGATIVE NEGATIVE Final    Comment: (NOTE) SARS-CoV-2 target nucleic acids are NOT DETECTED. The SARS-CoV-2 RNA is generally detectable in upper respiratoy specimens during the acute phase of infection. The lowest concentration of SARS-CoV-2 viral copies this assay can detect is 131 copies/mL. A negative result does not preclude SARS-Cov-2 infection and should not be used as the sole basis for treatment or other patient management decisions. A negative result may occur with  improper specimen collection/handling, submission of specimen other than nasopharyngeal swab, presence of viral mutation(s) within the areas targeted by this assay, and inadequate number of viral copies (<131 copies/mL). A negative result must be combined with clinical observations, patient history, and epidemiological information. The expected result is Negative. Fact Sheet for Patients:  https://www.moore.com/ Fact Sheet for Healthcare Providers:  https://www.young.biz/ This test is not yet ap proved or cleared by the [place] FDA and  has been authorized for detection and/or diagnosis of SARS-CoV-2 by FDA under an Emergency Use Authorization (EUA). This EUA will remain  in effect (meaning this test can be used) for the duration of the COVID-19 declaration under Section 564(b)(1) of  the Act, 21 U.S.C. section 360bbb-3(b)(1), unless the authorization is terminated or revoked sooner.    Influenza A by PCR NEGATIVE NEGATIVE Final   Influenza B by PCR NEGATIVE NEGATIVE Final    Comment: (NOTE) The Xpert Xpress SARS-CoV-2/FLU/RSV assay is intended as an aid in  the diagnosis of influenza from Nasopharyngeal swab specimens and  should not be used as a sole basis for treatment. Nasal washings and   aspirates are unacceptable for Xpert Xpress SARS-CoV-2/FLU/RSV  testing. Fact Sheet for Patients: PinkCheek.be Fact Sheet for Healthcare Providers: GravelBags.it This test is not yet approved or cleared by the [place] FDA and  has been authorized for detection and/or diagnosis of SARS-CoV-2 by  FDA under an Emergency Use Authorization (EUA). This EUA will remain  in effect (meaning this test can be used) for the duration of the  Covid-19 declaration under Section 564(b)(1) of the Act, 21  U.S.C. section 360bbb-3(b)(1), unless the authorization is  terminated or revoked. Performed at [facility], [address] [place], [place] [address]     Best Practice/Protocols:  VTE Prophylaxis: Lovenox (prophylaxtic dose) GI Prophylaxis: Proton Pump Inhibitor CIWA/High dose thiamine    Events: 2/20-admission ICU 2/20-hypotensive with rewarming, requiring Neo-Synephrine 2/21-pressors weaned off, on CIWA, CT chest: No cavitary lesion, possible small patch of pneumonia  Studies: CT CHEST W CONTRAST  Result Date: 11/02/2019 CLINICAL DATA:  Possible left lung cavitary lesion on recent chest x-ray. EXAM: CT CHEST WITH CONTRAST TECHNIQUE: Multidetector CT imaging of the chest was performed during intravenous contrast administration. IV mechanical malfunction during contrast injection is only a small amount of intravenous contrast was administered for the exam. Contrast injection was not repeated. CONTRAST:  55mL OMNIPAQUE IOHEXOL 300 MG/ML  SOLN COMPARISON:  07/27/2018 FINDINGS: Cardiovascular: Heart is normal size. Mild calcified plaque over the 3 vessel coronary arteries. Thoracic aorta is normal in caliber. Remaining vascular structures are unremarkable. Mediastinum/Nodes: No evidence of mediastinal or hilar adenopathy. Remaining mediastinal structures are normal. Lungs/Pleura: Lungs are adequately inflated with minimal  dependent bibasilar atelectasis. No evidence of nodule or cavitary process within the left lung. There is a focal somewhat linear hazy focus of airspace opacification over the periphery of the anterior right upper lobe which may be due to atelectasis or infection. No effusion. Airways are normal. Upper Abdomen: No acute findings. Musculoskeletal: Stable mild compression deformities of T5 through T7. Mild degenerative changes of the spine. IMPRESSION: 1. No evidence of cavitary lesion over the left lung. Hazy peripheral linear area of opacification over the anterior right upper lobe which may be due to atelectasis or infection. Recommend follow-up CT 4-6 weeks. 2.  Three stable mild compression deformities involving T5-T7. 3.  Atherosclerotic coronary artery disease. Electronically Signed   By: Marin Olp M.D.   On: 11/02/2019 11:43   DG Chest Portable 1 View  Result Date: 11/01/2019 CLINICAL DATA:  Altered mental status. EXAM: PORTABLE CHEST 1 VIEW COMPARISON:  August 20, 2018 FINDINGS: There is no pneumothorax or large pleural effusion. The heart size is normal. There is suggestion of a potentially cavitary 1.5 cm lesion in the left mid lung zone. This may represent artifact from overlapping osseous structures, however a true pulmonary lesion is difficult to entirely exclude. There is no acute osseous abnormality. IMPRESSION: Possible cavitary 1.5 cm lesion in the left mid lung zone. Consider further evaluation with chest CT, preferably with contrast. No pneumothorax or large pleural effusion. Electronically Signed   By: Jamie Kato.D.  On: 11/01/2019 19:37    Consults:    Subjective:    Overnight Issues: Has rewarmed.  Off of pressors.  Somewhat tremulous asking for Ativan.  He is on CIWA scale.  No overt complaints.  Does not recall events prior to admission.  Objective:  Vital signs for last 24 hours: Temp:  [84.9 F (29.4 C)-99.5 F (37.5 C)] 99.5 F (37.5 C) (02/21  1615) Pulse Rate:  [40-112] 102 (02/21 1615) Resp:  [0-22] 17 (02/21 1615) BP: (79-146)/(46-97) 137/97 (02/21 1615) SpO2:  [96 %-100 %] 100 % (02/21 1615) Weight:  [57.8 kg-63.5 kg] 57.8 kg (02/21 0404)  Hemodynamic parameters for last 24 hours:    Intake/Output from previous day: 02/20 0701 - 02/21 0700 In: 762.5 [I.V.:762.5] Out: 2075 [Urine:2075]  Intake/Output this shift: Total I/O In: 486.4 [I.V.:486.4] Out: 2050 [Urine:2050]  Vent settings for last 24 hours:    Physical Exam:  General:  Acutely ill appearing male, laying in bed, thin, on room air, in NAD Neuro:   Sleepy but arousable, somewhat confused, oriented x3 not to situation.  PERRL HEENT:  Atraumatic, normocephalic, oral mucosa dry Neck: Supple, no JVD, trachea midline Cardiovascular:  Tachycardic with regular rhythm, s1s2, no M/R/G Lungs:  Clear to auscultation bilaterally,nonlabored, normal effort Abdomen:  Soft, nontender, nondistended, no guarding or rebound tenderness, BS+ x4 Musculoskeletal:  Normal bulk and tone, no deformities Skin:  Cool and dry.  No obvious rashes, lesions, or ulcerations  Assessment/Plan:  Hypothermia secondary to cold exposure Acute alcohol intoxication in the setting of chronic alcoholism Has rewarmed Off of pressors Signs of early withdrawal noted   Hypotension, likely in the setting of rewarming vs. Developing sepsis (? Lung cavitary lesion) Lactic acidosis Continuous cardiac monitoring Maintain MAP greater than 65 IV fluids, D5 LR Possible alcoholic ketoacidosis Trend lactic acid until normalized Chest CT showed no cavitary lesion small patch of possible pneumonia likely aspiration Continue antibiotics   Early alcohol withdrawal Chronic alcoholism Provide supportive care CIWA protocol Precedex if needed High-dose thiamine Monitor electrolytes and supplement as needed Monitor for seizures Urine drug screen is negative Will need rehabilitation once  able.  Pulmonary cavitary lesion in Left mid lung zone on CXR Ruled out by CT Small patch of possible aspiration pneumonitis Continue Rocephin  Mild transaminitis, likely secondary to alcoholic hepatitis Trend LFTs This will delay lactic acid clearance  Anemia without signs or symptoms of bleeding High risk for upper GI bleed Monitor for S/Sx of bleeding Trend CBC Subcu Lovenox for VTE Prophylaxis  Transfuse for Hgb <7    LOS: 1 day    Additional comments: Care coordination performed with patient's bedside nurse [facility]  Critical Care Total Time*:   C. Danice Goltz, MD [facility] 11/02/2019  *Care during the described time interval was provided by me and/or other providers on the critical care team.  I have reviewed this patient's available data, including medical history, events of note, physical examination and test results as part of my evaluation.  **This note was dictated using voice recognition software/Dragon.  Despite best efforts to proofread, errors can occur which can change the meaning.  Any change was purely unintentional.

## 2019-11-02 NOTE — Progress Notes (Signed)
Patient upon assessment is AOx4.  He does not remember how he arrived to the hospital.  The first words he uttered when he awoke was, "where's my ativan?". I greeted the patient and explained who I was and what I would be doing to assist him through his therapy why at [facility].  I explained to patient the indications of alcohol withdrawal and I also explained the patients current course of therapy consisting of NEO 45mcg/min and Multi vitamin NS bag for rehydration.  The patient is able to move himself around in bed.  He has good peripheral movement.  He is weak in all the extremities when force is applied.  His skin is warm to the touch but he is currently negative of tremors or diaphoresis.  His lung sounds are diminished.  His heart sounds S1S2 are faint.  His bowel sounds are hypoactive.  Patient states he consumes a 5th of "anything" per day.  Contacted physician who ordered him for CIWA therapy.  Precedex therapy is included. His vitals are WDL 98.6 bladder, 121/81 (94), RR 12, SpO2 100% 2L [place].

## 2019-11-02 NOTE — Progress Notes (Signed)
Patient noted to have frequent periods of apnea with sats dropping <60% during those periods but sats quickly return to 100% . O2 via [place] placed on patient @ 2LPM. Will continue to monitor

## 2019-11-03 LAB — BASIC METABOLIC PANEL
Anion gap: 8 (ref 5–15)
BUN: 10 mg/dL (ref 6–20)
CO2: 27 mmol/L (ref 22–32)
Calcium: 7.5 mg/dL — ABNORMAL LOW (ref 8.9–10.3)
Chloride: 102 mmol/L (ref 98–111)
Creatinine, Ser: 0.57 mg/dL — ABNORMAL LOW (ref 0.61–1.24)
GFR calc Af Amer: >60 mL/min (ref >60)
GFR calc non Af Amer: >60 mL/min (ref >60)
Glucose, Bld: 109 mg/dL — ABNORMAL HIGH (ref 70–99)
Potassium: 3 mmol/L — ABNORMAL LOW (ref 3.5–5.1)
Sodium: 137 mmol/L (ref 135–145)

## 2019-11-03 LAB — CBC
HCT: 24.7 % — ABNORMAL LOW (ref 39.0–52.0)
Hemoglobin: 8.2 g/dL — ABNORMAL LOW (ref 13.0–17.0)
MCH: 31.3 pg (ref 26.0–34.0)
MCHC: 33.2 g/dL (ref 30.0–36.0)
MCV: 94.3 fL (ref 80.0–100.0)
Platelets: 104 10*3/uL — ABNORMAL LOW (ref 150–400)
RBC: 2.62 MIL/uL — ABNORMAL LOW (ref 4.22–5.81)
RDW: 14.3 % (ref 11.5–15.5)
WBC: 3.3 10*3/uL — ABNORMAL LOW (ref 4.0–10.5)
nRBC: 0 % (ref 0.0–0.2)

## 2019-11-03 LAB — LEGIONELLA PNEUMOPHILA SEROGP 1 UR AG: L. pneumophila Serogp 1 Ur Ag: NEGATIVE

## 2019-11-03 LAB — PROCALCITONIN: Procalcitonin: 0.29 ng/mL

## 2019-11-03 LAB — PHOSPHORUS: Phosphorus: 2.1 mg/dL — ABNORMAL LOW (ref 2.5–4.6)

## 2019-11-03 MED ORDER — PANTOPRAZOLE SODIUM 40 MG PO TBEC
40.0000 mg | DELAYED_RELEASE_TABLET | Freq: Every day | ORAL | Status: DC
  Administered 2019-11-03 – 2019-11-04 (×2): 40 mg via ORAL
  Filled 2019-11-03 (×2): qty 1

## 2019-11-03 MED ORDER — FOLIC ACID 1 MG PO TABS
1.0000 mg | ORAL_TABLET | Freq: Every day | ORAL | Status: DC
  Administered 2019-11-03 – 2019-11-10 (×8): 1 mg via ORAL
  Filled 2019-11-03 (×8): qty 1

## 2019-11-03 MED ORDER — ADULT MULTIVITAMIN W/MINERALS CH
1.0000 | ORAL_TABLET | Freq: Every day | ORAL | Status: DC
  Administered 2019-11-03 – 2019-11-10 (×8): 1 via ORAL
  Filled 2019-11-03 (×9): qty 1

## 2019-11-03 MED ORDER — SODIUM CHLORIDE 0.9 % IV SOLN
3.0000 g | Freq: Four times a day (QID) | INTRAVENOUS | Status: DC
  Administered 2019-11-03 – 2019-11-04 (×5): 3 g via INTRAVENOUS
  Filled 2019-11-03: qty 3
  Filled 2019-11-03 (×4): qty 8
  Filled 2019-11-03: qty 3
  Filled 2019-11-03: qty 8
  Filled 2019-11-03: qty 3

## 2019-11-03 MED ORDER — ENOXAPARIN SODIUM 40 MG/0.4ML ~~LOC~~ SOLN
40.0000 mg | SUBCUTANEOUS | Status: DC
  Administered 2019-11-03: 40 mg via SUBCUTANEOUS
  Filled 2019-11-03: qty 0.4

## 2019-11-03 MED ORDER — SODIUM PHOSPHATES 45 MMOLE/15ML IV SOLN
20.0000 mmol | Freq: Once | INTRAVENOUS | Status: AC
  Administered 2019-11-03: 13:00:00 20 mmol via INTRAVENOUS
  Filled 2019-11-03: qty 6.67

## 2019-11-03 MED ORDER — MAGNESIUM SULFATE 4 GM/100ML IV SOLN
4.0000 g | Freq: Once | INTRAVENOUS | Status: AC
  Administered 2019-11-03: 4 g via INTRAVENOUS
  Filled 2019-11-03: qty 100

## 2019-11-03 MED ORDER — THIAMINE HCL 100 MG PO TABS
100.0000 mg | ORAL_TABLET | Freq: Every day | ORAL | Status: DC
  Administered 2019-11-07 – 2019-11-10 (×4): 100 mg via ORAL
  Filled 2019-11-03 (×4): qty 1

## 2019-11-03 MED ORDER — POTASSIUM CHLORIDE CRYS ER 20 MEQ PO TBCR
40.0000 meq | EXTENDED_RELEASE_TABLET | Freq: Two times a day (BID) | ORAL | Status: AC
  Administered 2019-11-03 (×2): 40 meq via ORAL
  Filled 2019-11-03 (×2): qty 2

## 2019-11-03 NOTE — Consult Note (Signed)
PHARMACY CONSULT NOTE - FOLLOW UP  Pharmacy Consult for Electrolyte Monitoring and Replacement   Recent Labs: Potassium (mmol/L)  Date Value  11/03/2019 3.0 (L)   Magnesium (mg/dL)  Date Value  00/52/5910 1.6 (L)   Calcium (mg/dL)  Date Value  28/90/2284 7.5 (L)   Albumin (g/dL)  Date Value  06/98/6148 2.5 (L)   Phosphorus (mg/dL)  Date Value  30/73/5430 2.1 (L)   Sodium (mmol/L)  Date Value  11/03/2019 137     Assessment: Terry Clark is a 49 y.o. male admitted on 11/01/2019 with pneumonia. Pt was admitted to ED for hypothermia due to exposure while intoxicated. Pharmacy was consulted for electrolyte management.   Goal of Therapy:  Electrolytes WNL   Plan:  K 3.0; P 2.1  Pt received KCl 40 mEq tablet for 2 doses this AM @ 0758 and 1052.  Currently on D5 drip.  Ordered sodium phosphate 20 mmol in D5 IV 1x.   Pharmacy will follow up with AM labs.   Henreitta Leber  Pharmacy student 11/03/2019 12:26 PM.

## 2019-11-03 NOTE — Progress Notes (Signed)
[facility] visited pt. while rounding on ICU; pt. in bed eating lunch when [facility] arrived.  Pt. shared he is not in close contact with his family, though he has a son and mother living close by.  [facility] unable to determine what pt.'s living situation is; pt. mentioned living in a shelter, but was not forthcoming with details.  No further needs perceived.       11/03/19 1150  Clinical Encounter Type  Visited With Patient;Health care provider  Visit Type Initial;Psychological support;Social support  Referral From Other (Comment) (Routine Rounding)  Stress Factors  Patient Stress Factors None identified

## 2019-11-03 NOTE — Evaluation (Signed)
Physical Therapy Evaluation Patient Details Name: Terry Clark MRN: [id number] DOB: Oct 02, 1970 Today's Date: 11/03/2019   History of Present Illness  Pt is a 49 y.o. male presenting to hospital 11/01/19 with hypothermia (pt found confused and sitting in cold; core temp 86.2 prior to arrival to ED).  Per chart review, pt with h/o multiple admissions d/t alcohol intoxication.  Pt admitted with hypothermia secondary cold exposure, hypotension, AMS secondary ETOH intoxication, and possible small patch PNA.  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  Clinical Impression  Prior to hospital admission, pt was modified independent with ambulation using cane; pt reports living on 2nd floor of house.  Currently pt is min to mod assist with bed mobility; mod assist 1st trial standing and max assist 2nd trial standing up to walker; and CGA to min assist x2 to take steps in place (x10 reps B LE's) and side step a few feet along bed to L with walker use.  Pt demonstrating generalized weakness; pt also becoming nauseas with activity (nurse notified).  Pt would benefit from skilled PT to address noted impairments and functional limitations (see below for any additional details).  Upon hospital discharge, pt currently would benefit from STR but anticipate with continued improvement in medical condition, pt's strength and functional status will continue to improve; will monitor for any changes in PT follow up recommendations.    Follow Up Recommendations SNF(pending progress)    Equipment Recommendations  Rolling walker with 5" wheels;3in1 (PT)    Recommendations for Other Services OT consult     Precautions / Restrictions Precautions Precautions: Fall Restrictions Weight Bearing Restrictions: No      Mobility  Bed Mobility Overal bed mobility: Needs  Assistance Bed Mobility: Supine to Sit;Sit to Supine     Supine to sit: Min assist;Mod assist;HOB elevated Sit to supine: Min assist;Mod assist;HOB elevated   General bed mobility comments: assist for trunk semi-supine to sitting edge of bed; min assist to scoot to edge of bed; min to mod assist to lay back down and 2 assist to reposition in bed  Transfers Overall transfer level: Needs assistance Equipment used: Rolling walker (2 wheeled) Transfers: Sit to/from Stand Sit to Stand: Min assist;Mod assist;Max assist;+2 safety/equipment         General transfer comment: mod assist 1st trial standing and max assist 2nd trial standing up to RW (2 assist present for safety); vc's for UE/LE placement; assist to initiate and come to full stand up to walker  Ambulation/Gait Ambulation/Gait assistance: Min guard;Min assist;+2 physical assistance Gait Distance (Feet): (pt took steps in place x10 reps B LE's and then sidestepped a few feet to L prior to sitting on edge of bed) Assistive device: Rolling walker (2 wheeled)   Gait velocity: decreased   General Gait Details: decreased B LE foot clearance  Stairs            Wheelchair Mobility    Modified Rankin (Stroke Patients Only)       Balance Overall balance assessment: Needs assistance Sitting-balance support: No upper extremity supported;Feet supported Sitting balance-Leahy Scale: Good Sitting balance - Comments: steady sitting reaching within BOS   Standing balance support: Bilateral upper extremity supported Standing balance-Leahy Scale: Poor Standing balance comment: pt requiring at least single UE support for static standing balance  Pertinent Vitals/Pain Pain Assessment: No/denies pain  Vitals (HR and O2 on room air) stable and WFL throughout treatment session.    Home Living Family/patient expects to be discharged to:: Private residence Living Arrangements: Other  relatives   Type of Home: House(pt reports it is a condemned home (care management notified)) Home Access: Stairs to enter Entrance Stairs-Rails: Right;Left Entrance Stairs-Number of Steps: [place]: Two level;Bed/bath upstairs Home Equipment: Cane - single point      Prior Function Level of Independence: Independent with assistive device(s)         Comments: Pt reports recently using SPC for walking and 4 recent falls.     Hand Dominance        Extremity/Trunk Assessment   Upper Extremity Assessment Upper Extremity Assessment: Generalized weakness    Lower Extremity Assessment Lower Extremity Assessment: Generalized weakness    Cervical / Trunk Assessment Cervical / Trunk Assessment: Normal  Communication   Communication: No difficulties  Cognition Arousal/Alertness: Awake/alert Behavior During Therapy: Flat affect Overall Cognitive Status: No family/caregiver present to determine baseline cognitive functioning                                 General Comments: Oriented to at least person and place (time and situation not asked)      General Comments   Nursing cleared pt for participation in physical therapy.  Pt agreeable to PT session.    Exercises     Assessment/Plan    PT Assessment Patient needs continued PT services  PT Problem List Decreased strength;Decreased activity tolerance;Decreased balance;Decreased mobility;Decreased cognition;Decreased knowledge of use of DME;Decreased knowledge of precautions       PT Treatment Interventions DME instruction;Gait training;Stair training;Functional mobility training;Therapeutic activities;Therapeutic exercise;Balance training;Patient/family education    PT Goals (Current goals can be found in the Care Plan section)  Acute Rehab PT Goals Patient Stated Goal: to improve strength PT Goal Formulation: With patient Time For Goal Achievement: 11/17/19 Potential to Achieve Goals: Good     Frequency Min 2X/week   Barriers to discharge        Co-evaluation               AM-PAC PT "6 Clicks" Mobility  Outcome Measure Help needed turning from your back to your side while in a flat bed without using bedrails?: A Little Help needed moving from lying on your back to sitting on the side of a flat bed without using bedrails?: A Lot Help needed moving to and from a bed to a chair (including a wheelchair)?: A Lot Help needed standing up from a chair using your arms (e.g., wheelchair or bedside chair)?: A Lot Help needed to walk in hospital room?: A Lot Help needed climbing 3-5 steps with a railing? : Total 6 Click Score: 12    End of Session Equipment Utilized During Treatment: Gait belt Activity Tolerance: Other (comment)(Limited d/t nausea) Patient left: in bed;with call bell/phone within reach;with bed alarm set Nurse Communication: Mobility status;Precautions(pt nauseas) PT Visit Diagnosis: Unsteadiness on feet (R26.81);Other abnormalities of gait and mobility (R26.89);Muscle weakness (generalized) (M62.81);Difficulty in walking, not elsewhere classified (R26.2);History of falling (Z91.81)    Time: 1345-1415 PT Time Calculation (min) (ACUTE ONLY): 30 min   Charges:   PT Evaluation $PT Eval Low Complexity: 1 Low PT Treatments $Therapeutic Activity: 8-22 mins        Leitha Bleak, PT 11/03/19, 4:02 PM

## 2019-11-03 NOTE — Progress Notes (Signed)
Follow up - Critical Care Medicine Note  Patient Details:    Terry Clark is an 49 y.o. male admitted 11/01/2019 due to severe hypothermia and altered mental status secondary to alcohol intoxication.  During rewarming he has become hypotensive requiring Neo-Synephrine drip.  Sepsis work-up in progress.  Chest x-ray with potential cavitary lesion in the left midlung zone, CT chest pending.  Lines, Airways, Drains:    Anti-infectives:  Anti-infectives (From admission, onward)   Start     Dose/Rate Route Frequency Ordered Stop   11/01/19 2130  cefTRIAXone (ROCEPHIN) 1 g in sodium chloride 0.9 % 100 mL IVPB     1 g 200 mL/hr over 30 Minutes Intravenous Every 24 hours 11/01/19 2116        Microbiology: Results for orders placed or performed during the hospital encounter of 11/01/19  Culture, blood (routine x 2)     Status: None (Preliminary result)   Collection Time: 11/01/19  6:55 PM   Specimen: BLOOD  Result Value Ref Range Status   Specimen Description BLOOD RIGHT ANTECUBITAL  Final   Special Requests   Final    BOTTLES DRAWN AEROBIC AND ANAEROBIC Blood Culture results may not be optimal due to an excessive volume of blood received in culture bottles   Culture   Final    NO GROWTH 2 DAYS Performed at [facility], [address] [place], [place] [address]    Report Status PENDING  Incomplete  Culture, blood (routine x 2)     Status: None (Preliminary result)   Collection Time: 11/01/19  7:15 PM   Specimen: BLOOD  Result Value Ref Range Status   Specimen Description BLOOD LEFT ANTECUBITAL  Final   Special Requests   Final    BOTTLES DRAWN AEROBIC AND ANAEROBIC Blood Culture results may not be optimal due to an excessive volume of blood received in culture bottles   Culture   Final    NO GROWTH 2 DAYS Performed at [facility], [address], [place], [place] [address]    Report Status PENDING  Incomplete  Respiratory Panel by RT PCR (Flu A&B, Covid)  - Nasopharyngeal Swab     Status: None   Collection Time: 11/01/19  7:15 PM   Specimen: Nasopharyngeal Swab  Result Value Ref Range Status   SARS Coronavirus 2 by RT PCR NEGATIVE NEGATIVE Final    Comment: (NOTE) SARS-CoV-2 target nucleic acids are NOT DETECTED. The SARS-CoV-2 RNA is generally detectable in upper respiratoy specimens during the acute phase of infection. The lowest concentration of SARS-CoV-2 viral copies this assay can detect is 131 copies/mL. A negative result does not preclude SARS-Cov-2 infection and should not be used as the sole basis for treatment or other patient management decisions. A negative result may occur with  improper specimen collection/handling, submission of specimen other than nasopharyngeal swab, presence of viral mutation(s) within the areas targeted by this assay, and inadequate number of viral copies (<131 copies/mL). A negative result must be combined with clinical observations, patient history, and epidemiological information. The expected result is Negative. Fact Sheet for Patients:  https://www.moore.com/ Fact Sheet for Healthcare Providers:  https://www.young.biz/ This test is not yet ap proved or cleared by the [place] FDA and  has been authorized for detection and/or diagnosis of SARS-CoV-2 by FDA under an Emergency Use Authorization (EUA). This EUA will remain  in effect (meaning this test can be used) for the duration of the COVID-19 declaration under Section 564(b)(1) of the Act,  21 U.S.C. section 360bbb-3(b)(1), unless the authorization is terminated or revoked sooner.    Influenza A by PCR NEGATIVE NEGATIVE Final   Influenza B by PCR NEGATIVE NEGATIVE Final    Comment: (NOTE) The Xpert Xpress SARS-CoV-2/FLU/RSV assay is intended as an aid in  the diagnosis of influenza from Nasopharyngeal swab specimens and  should not be used as a sole basis for treatment. Nasal washings and   aspirates are unacceptable for Xpert Xpress SARS-CoV-2/FLU/RSV  testing. Fact Sheet for Patients: https://www.moore.com/ Fact Sheet for Healthcare Providers: https://www.young.biz/ This test is not yet approved or cleared by the [place] FDA and  has been authorized for detection and/or diagnosis of SARS-CoV-2 by  FDA under an Emergency Use Authorization (EUA). This EUA will remain  in effect (meaning this test can be used) for the duration of the  Covid-19 declaration under Section 564(b)(1) of the Act, 21  U.S.C. section 360bbb-3(b)(1), unless the authorization is  terminated or revoked. Performed at [facility], [address] [place], [place] [address]     Best Practice/Protocols:  VTE Prophylaxis: Lovenox (prophylaxtic dose) GI Prophylaxis: Proton Pump Inhibitor CIWA/High dose thiamine    Events: 2/20-admission ICU 2/20-hypotensive with rewarming, requiring Neo-Synephrine 2/21-pressors weaned off, on CIWA, CT chest: No cavitary lesion, possible small patch of pneumonia  Studies: CT CHEST W CONTRAST  Result Date: 11/02/2019 CLINICAL DATA:  Possible left lung cavitary lesion on recent chest x-ray. EXAM: CT CHEST WITH CONTRAST TECHNIQUE: Multidetector CT imaging of the chest was performed during intravenous contrast administration. IV mechanical malfunction during contrast injection is only a small amount of intravenous contrast was administered for the exam. Contrast injection was not repeated. CONTRAST:  39mL OMNIPAQUE IOHEXOL 300 MG/ML  SOLN COMPARISON:  07/27/2018 FINDINGS: Cardiovascular: Heart is normal size. Mild calcified plaque over the 3 vessel coronary arteries. Thoracic aorta is normal in caliber. Remaining vascular structures are unremarkable. Mediastinum/Nodes: No evidence of mediastinal or hilar adenopathy. Remaining mediastinal structures are normal. Lungs/Pleura: Lungs are adequately inflated with minimal  dependent bibasilar atelectasis. No evidence of nodule or cavitary process within the left lung. There is a focal somewhat linear hazy focus of airspace opacification over the periphery of the anterior right upper lobe which may be due to atelectasis or infection. No effusion. Airways are normal. Upper Abdomen: No acute findings. Musculoskeletal: Stable mild compression deformities of T5 through T7. Mild degenerative changes of the spine. IMPRESSION: 1. No evidence of cavitary lesion over the left lung. Hazy peripheral linear area of opacification over the anterior right upper lobe which may be due to atelectasis or infection. Recommend follow-up CT 4-6 weeks. 2.  Three stable mild compression deformities involving T5-T7. 3.  Atherosclerotic coronary artery disease. Electronically Signed   By: Elberta Fortis M.D.   On: 11/02/2019 11:43   DG Chest Portable 1 View  Result Date: 11/01/2019 CLINICAL DATA:  Altered mental status. EXAM: PORTABLE CHEST 1 VIEW COMPARISON:  August 20, 2018 FINDINGS: There is no pneumothorax or large pleural effusion. The heart size is normal. There is suggestion of a potentially cavitary 1.5 cm lesion in the left mid lung zone. This may represent artifact from overlapping osseous structures, however a true pulmonary lesion is difficult to entirely exclude. There is no acute osseous abnormality. IMPRESSION: Possible cavitary 1.5 cm lesion in the left mid lung zone. Consider further evaluation with chest CT, preferably with contrast. No pneumothorax or large pleural effusion. Electronically Signed   By: Katherine Mantle M.D.   On: 11/01/2019  19:37        Subjective:    NAD Alert and awake Eating breakfast   Objective:  Vital signs for last 24 hours: Temp:  [98.4 F (36.9 C)-99.5 F (37.5 C)] 98.7 F (37.1 C) (02/22 0730) Pulse Rate:  [63-120] 84 (02/22 0730) Resp:  [10-29] 26 (02/22 0730) BP: (102-155)/(76-106) 150/90 (02/22 0730) SpO2:  [92 %-100 %] 100 % (02/22  0730) Weight:  [63.7 kg] 63.7 kg (02/22 0430)  Hemodynamic parameters for last 24 hours:    Intake/Output from previous day: 02/21 0701 - 02/22 0700 In: 1361.8 [I.V.:1208.1; IV Piggyback:153.6] Out: 2250 [Urine:2250]  Intake/Output this shift: Total I/O In: 500 [I.V.:500] Out: -     Review of Systems:  Gen:  Denies  fever, sweats, chills weight loss  HEENT: Denies blurred vision, double vision, ear pain, eye pain, hearing loss, nose bleeds, sore throat Cardiac:  No dizziness, chest pain or heaviness, chest tightness,edema, No JVD Resp:   No cough, -sputum production, -shortness of breath,-wheezing, -hemoptysis,  Gi: Denies swallowing difficulty, stomach pain, nausea or vomiting, diarrhea, constipation, bowel incontinence Gu:  Denies bladder incontinence, burning urine Ext:   Denies Joint pain, stiffness or swelling Skin: Denies  skin rash, easy bruising or bleeding or hives Endoc:  Denies polyuria, polydipsia , polyphagia or weight change Psych:   Denies depression, insomnia or hallucinations  Other:  All other systems negative   Physical Examination:   General Appearance: No distress  Neuro:without focal findings,  speech normal,  HEENT: PERRLA, EOM intact.   Pulmonary: normal breath sounds, No wheezing.  CardiovascularNormal S1,S2.  No m/r/g.   Abdomen: Benign, Soft, non-tender. Renal:  No costovertebral tenderness  GU:  Not performed at this time. Endoc: No evident thyromegaly Skin:   warm, no rashes, no ecchymosis  Extremities: normal, no cyanosis, clubbing. PSYCHIATRIC: Mood, affect within normal limits.   ALL OTHER ROS ARE NEGATIVE   Assessment/Plan:   Hypothermia secondary to cold exposure and acute alcohol intoxication Resolving Off pressors He is rewarmed  Shock Hypovolemia Resolved Continue IV fluids Continue antibiotics for aspiration pneumonia  Chronic alcohol abuse Early alcohol withdrawal Continue CIWA protocol Folic acid and  thiamine  Aspiration pneumonia Continue antibiotics as prescribed  ELECTROLYTES -follow labs as needed -replace as needed -pharmacy consultation and following    DVT/GI PRX ordered TRANSFUSIONS AS NEEDED MONITOR FSBS ASSESS the need for LABS as needed    Patient no longer meets ICU criteria Transfer patient to [facility] hospitalist service Dr Posey Pronto with [facility] notified   Corrin Parker, M.D.  [facility]  Medical Director [place] Director [facility] Cardio-Pulmonary Department

## 2019-11-03 NOTE — TOC Initial Note (Signed)
Transition of Care [facility]) - Initial/Assessment Note    Patient Details  Name: Terry Clark MRN: [id number] Date of Birth: 1970/11/14  [facility] CM/SW Contact:    Magnus Ivan, [place] Phone Number: 11/03/2019, 1:34 PM  Clinical Narrative:             CSW met with patient at bedside. Explained CSW role. Patient reported he lives at [address] [place], [place] with "family." Patient reported his sister and cousin provide him with transportation as needed. [facility]) and denied having issues with obtaining meds. Patient does not have a PCP. CSW provided information for [facility] and booklet with information on free/reduced cost health care. Patient also accepted Substance Abuse resource list. Patient denied other needs at this time. He reported he is waiting on his Ativan, nursing staff aware. Patient was encouraged to reach out if needs arise. CSW will continue to follow.    Expected Discharge Plan: Home/Self Care Barriers to Discharge: Continued Medical Work up   Patient Goals and CMS Choice Patient states their goals for this hospitalization and ongoing recovery are:: to return home where he says he lives with family      Expected Discharge Plan and Services Expected Discharge Plan: Home/Self Care                                              Prior Living Arrangements/Services   Lives with:: Siblings Patient language and need for interpreter reviewed:: Yes Do you feel safe going back to the place where you live?: Yes      Need for Family Participation in Patient Care: Yes (Comment) Care giver support system in place?: Yes (comment)   Criminal Activity/Legal Involvement Pertinent to Current Situation/Hospitalization: No - Comment as needed  Activities of Daily Living Home Assistive Devices/Equipment: None ADL Screening (condition at time of admission) Patient's cognitive  ability adequate to safely complete daily activities?: No Is the patient deaf or have difficulty hearing?: No Does the patient have difficulty seeing, even when wearing glasses/contacts?: No Does the patient have difficulty concentrating, remembering, or making decisions?: Yes Patient able to express need for assistance with ADLs?: Yes Does the patient have difficulty dressing or bathing?: No Independently performs ADLs?: Yes (appropriate for developmental age) Does the patient have difficulty walking or climbing stairs?: No Weakness of Legs: Both Weakness of Arms/Hands: Both  Permission Sought/Granted                  Emotional Assessment Appearance:: Appears stated age Attitude/Demeanor/Rapport: Engaged Affect (typically observed): Quiet, Calm Orientation: : Oriented to Self, Oriented to Place, Oriented to  Time, Oriented to Situation Alcohol / Substance Use: Alcohol Use(substance abuse resources provided.) Psych Involvement: No (comment)  Admission diagnosis:  Hypothermia [T68.XXXA] Hypothermia, initial encounter [T68.XXXA] Alcoholic intoxication with complication ([place]) [O03.704] Altered mental status, unspecified altered mental status type [R41.82] Patient Active Problem List   Diagnosis Date Noted  . Hypothermia 11/01/2019  . 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 Alcohol withdrawal ([place]) 02/26/2019  . Hematemesis   . GI bleed 09/17/2018  . Alcoholic intoxication with complication ([place])   . Anger   . Subconjunctival hemorrhage of both eyes   . Alcohol withdrawal seizure ([place]) 06/13/2014  . 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   PCP:  Patient, [name] Pharmacy:   [id number] [id number]: [phone]: [phone] [id number] [place] [address] [place] [address] [place]one: [phone]: [phone]   Social Determinants of Health (SDOH) Interventions    Readmission Risk Interventions Readmission Risk Prevention Plan 11/03/2019 11/03/2019  Transportation Screening Complete Complete  PCP or Specialist Appt within 3-5 Days (No Data) (No Data)  Medication Review (RPress photographerComplete Complete  Some recent data might be hidden

## 2019-11-03 NOTE — Consult Note (Addendum)
Pharmacy Antibiotic Note  Terry Clark is a 49 y.o. male admitted on 11/01/2019 with pneumonia. Pt was admitted to ED for hypothermia due to exposure while intoxicated. UA negative for UTI. Chest x-ray shows small patch of pneumonia, suspect aspiration pneumonia per morning round. D/c ceftriaxone and start ampicillin-sulbactam. Pharmacy has been consulted for ampicillin-sulbactam dosing. Pt received last dose of ceftriaxone on 0221 2206.   Plan: Start ampicillin-sulbactam 3g 100 ml IVPB every 6 hours.   Height: 5\' 7"  (170.2 cm) Weight: 140 lb 6.9 oz (63.7 kg) IBW/kg (Calculated) : 66.1  Temp (24hrs), Avg:98.9 F (37.2 C), Min:98.4 F (36.9 C), Max:99.5 F (37.5 C)  Recent Labs  Lab 11/01/19 1906 11/01/19 2057 11/02/19 0110 11/02/19 0511 11/03/19 0435  WBC 5.7  --   --  5.0 3.3*  CREATININE 0.68  --   --  0.66 0.57*  LATICACIDVEN 6.0* 4.9* 3.2* 2.3*  --     Estimated Creatinine Clearance: 101.7 mL/min (A) (by C-G formula based on SCr of 0.57 mg/dL (L)).    No Known Allergies  Antimicrobials this admission: Ceftriaxone 2/20 >> 2/22 Ampicillin-sulbactam 2/22 >>   Dose adjustments this admission:   Microbiology results: 2/20 BCx: pending  Thank you for allowing pharmacy to be a part of this patient's care.  3/20  Pharmacy student  11/03/2019 11:12 AM

## 2019-11-04 DIAGNOSIS — T68XXXD Hypothermia, subsequent encounter: Secondary | ICD-10-CM

## 2019-11-04 LAB — BASIC METABOLIC PANEL
Anion gap: 5 (ref 5–15)
BUN: 6 mg/dL (ref 6–20)
CO2: 29 mmol/L (ref 22–32)
Calcium: 7.7 mg/dL — ABNORMAL LOW (ref 8.9–10.3)
Chloride: 98 mmol/L (ref 98–111)
Creatinine, Ser: 0.52 mg/dL — ABNORMAL LOW (ref 0.61–1.24)
GFR calc Af Amer: >60 mL/min (ref >60)
GFR calc non Af Amer: >60 mL/min (ref >60)
Glucose, Bld: 141 mg/dL — ABNORMAL HIGH (ref 70–99)
Potassium: 3.3 mmol/L — ABNORMAL LOW (ref 3.5–5.1)
Sodium: 132 mmol/L — ABNORMAL LOW (ref 135–145)

## 2019-11-04 LAB — CBC
HCT: 25.3 % — ABNORMAL LOW (ref 39.0–52.0)
Hemoglobin: 8.4 g/dL — ABNORMAL LOW (ref 13.0–17.0)
MCH: 31.2 pg (ref 26.0–34.0)
MCHC: 33.2 g/dL (ref 30.0–36.0)
MCV: 94.1 fL (ref 80.0–100.0)
Platelets: 90 10*3/uL — ABNORMAL LOW (ref 150–400)
RBC: 2.69 MIL/uL — ABNORMAL LOW (ref 4.22–5.81)
RDW: 14 % (ref 11.5–15.5)
WBC: 3.7 10*3/uL — ABNORMAL LOW (ref 4.0–10.5)
nRBC: 0 % (ref 0.0–0.2)

## 2019-11-04 LAB — PHOSPHORUS: Phosphorus: 2.2 mg/dL — ABNORMAL LOW (ref 2.5–4.6)

## 2019-11-04 LAB — MAGNESIUM: Magnesium: 1.7 mg/dL (ref 1.7–2.4)

## 2019-11-04 LAB — HIV ANTIBODY (ROUTINE TESTING W REFLEX): HIV Screen 4th Generation wRfx: NONREACTIVE — AB

## 2019-11-04 LAB — OCCULT BLOOD X 1 CARD TO LAB, STOOL: Fecal Occult Bld: POSITIVE — AB

## 2019-11-04 MED ORDER — STERILE WATER FOR INJECTION IJ SOLN
INTRAMUSCULAR | Status: AC
  Filled 2019-11-04: qty 10

## 2019-11-04 MED ORDER — POTASSIUM & SODIUM PHOSPHATES 280-160-250 MG PO PACK
2.0000 | PACK | Freq: Three times a day (TID) | ORAL | Status: AC
  Administered 2019-11-04 – 2019-11-05 (×3): 2 via ORAL
  Filled 2019-11-04 (×3): qty 2

## 2019-11-04 MED ORDER — STERILE WATER FOR INJECTION IJ SOLN
INTRAMUSCULAR | Status: AC
  Administered 2019-11-04: 22:00:00 5 mL
  Filled 2019-11-04: qty 10

## 2019-11-04 MED ORDER — POTASSIUM CHLORIDE CRYS ER 20 MEQ PO TBCR
40.0000 meq | EXTENDED_RELEASE_TABLET | Freq: Once | ORAL | Status: AC
  Administered 2019-11-04: 11:00:00 40 meq via ORAL
  Filled 2019-11-04: qty 2

## 2019-11-04 MED ORDER — MAGNESIUM SULFATE 2 GM/50ML IV SOLN
2.0000 g | Freq: Once | INTRAVENOUS | Status: AC
  Administered 2019-11-04: 11:00:00 2 g via INTRAVENOUS
  Filled 2019-11-04: qty 50

## 2019-11-04 NOTE — Progress Notes (Addendum)
PROGRESS NOTE    Terry Clark  UKG:[id number] DOB: [birth year] DOA: 11/01/2019  PCP: Terry Clark, [name]    LOS - 3   Brief Narrative:  49 year old male with history of alcohol dependence and withdrawal seizures was admitted to ICU on 2/20 after presenting with altered mental status, severe hypothermia (85.60F), bradycardia with heart rate in the 40s, lactic acidosis and hypotension which developed during the rewarming process, required Neo-Synephrine.  Terry Clark had reportedly been found behind a convenience store sitting in the mud.  Chest x-ray on admission showed a potential cavitary lesion in the left midlung zone.  CT chest was obtained for further characterization, cavitary lesion was ruled out, but showed hazy opacity seen in the right upper lobe may represent atelectasis, repeat CT in 4 to 6 weeks was recommended.  Hypothermia and hypotension did resolve and Terry Clark was transferred to hospitalist service as of 2/23  Subjective 2/23: Terry Clark seen in stepdown this morning.  No acute events reported overnight.  When seen and examined he reports feeling "woozy" by which he means nauseous.  Denies vomiting but does report some left-sided abdominal pain.  No fevers or chills, chest pain or shortness of breath.  Assessment & Plan:   Active Problems:   Hypothermia   Acute metabolic encephalopathy secondary to EtOH intoxication Alcohol level 467 on admission.  Terry Clark with history of withdrawal seizures and alcohol dependence.  UDS negative. --Monitor by CIWA protocol for signs of withdrawal --Continue phenobarbital taper as ordered --Continue high-dose IV thiamine x5 days --Continue thiamine 100 mg p.o. daily --Neurochecks --Monitor procedures --Avoid sedating meds as much as possible --Will consider CT head if not improving, but seems to be improving well  Hypothermia secondary to cold exposure -resolved Sepsis work-up was negative, blood cultures still pending.  Has been on  empiric antibiotics, currently Unasyn.  Terry Clark is without respiratory symptoms, afebrile and without leukocytosis and had normal procalcitonin. --Will stop antibiotics and monitor clinically for signs of infection --Follow-up cultures  Possible pulmonary cavitary lesion, seen on initial chest x-ray.  Ruled out on CT chest. --Repeat CT chest in 4 to 6 weeks recommended per radiologist.  Mild transaminitis -likely secondary to alcohol abuse --Monitor LFTs --Continue Protonix for GI prophylaxis  Acute anemia -etiology to be determined.  Terry Clark had normal hemoglobin of 13.6 back on 10/22/2019.  Upon admission, hemoglobin 11.4, has down trended to 8.8 >> 8.2 >>8.4 since admission without any evidence of active bleeding.  Suspect this is dilutional as Terry Clark's white count and platelets have also decreased since admission. --check FOBT --monitor CBC daily --transfuse if Hbg < 7.0  Thrombocytopenia -new, possibly dilutional as hemoglobin and white counts have also trended downward.  However concern for liver disease given chronic alcohol abuse. --Hold pharmacologic VTE prophylaxis --SCDs --CBCs daily to monitor  F / E / N: Hypokalemia Leary Roca /hypomagnesemia -all likely due to alcohol abuse. --Pharmacy consulted for electrolyte replacement    DVT prophylaxis: SCDs   Code Status: Full Code  Family Communication: None at bedside during encounter  Disposition Plan: To be determined, expect discharge to prior home environment pending further improvement. Coming From home Exp DC Date: To be determined Barriers: Current medical condition altered mental status Medically Stable for Discharge?  No  Consultants:   Pharmacy  Transitions of care  Procedures:   None  Antimicrobials:   Rocephin -2/20 >> 2/22  Unasyn -2/22 >> 2/23   Objective: Vitals:   11/03/19 2224 11/03/19 2345 11/03/19 2352 11/04/19 0000  BP:   Marland Kitchen)  142/93 (!) 146/91  Pulse: 94 (!) 101 90 91  Resp:    10 14  Temp:    98.4 F (36.9 C)  TempSrc:    Axillary  SpO2:   100% 100%  Weight:      Height:        Intake/Output Summary (Last 24 hours) at 11/04/2019 0802 Last data filed at 11/04/2019 0100 Gross per 24 hour  Intake 3319.72 ml  Output 1050 ml  Net 2269.72 ml   Filed Weights   11/01/19 1902 11/02/19 0404 11/03/19 0430  Weight: 63.5 kg 57.8 kg 63.7 kg    Examination:  General exam: Sleeping but awakes to voice, no acute distress HEENT: moist mucus membranes, hearing grossly normal  Respiratory system: CTAB, intermittent faint expiratory wheezes, no rales or rhonchi, normal respiratory effort. Cardiovascular system: normal S1/S2, RRR, no JVD, murmurs, rubs, gallops, no pedal edema.   Gastrointestinal system: soft, mildly tender on palpation with guarding, ND, no HSM felt, +bowel sounds. Central nervous system: A&O x4. no gross focal neurologic deficits, normal speech Extremities: moves all, normal tone Psychiatry: normal mood, congruent affect, judgement and insight appear normal    Data Reviewed: I have personally reviewed following labs and imaging studies  CBC: Recent Labs  Lab 11/01/19 1906 11/02/19 0511 11/03/19 0435 11/04/19 0428  WBC 5.7 5.0 3.3* 3.7*  NEUTROABS 4.3  --   --   --   HGB 11.4* 8.8* 8.2* 8.4*  HCT 34.1* 25.8* 24.7* 25.3*  MCV 93.4 93.1 94.3 94.1  PLT 189 157 104* 90*   Basic Metabolic Panel: Recent Labs  Lab 11/01/19 1906 11/02/19 0511 11/03/19 0435 11/04/19 0428  NA 133* 141 137 132*  K 3.8 3.6 3.0* 3.3*  CL 96* 108 102 98  CO2 22 21* 27 29  GLUCOSE 176* 76 109* 141*  BUN 13 11 10 6   CREATININE 0.68 0.66 0.57* 0.52*  CALCIUM 8.1* 7.0* 7.5* 7.7*  MG 2.1 1.6*  --  1.7  PHOS  --   --  2.1* 2.2*   GFR: Estimated Creatinine Clearance: 101.7 mL/min (A) (by C-G formula based on SCr of 0.52 mg/dL (L)). Liver Function Tests: Recent Labs  Lab 11/01/19 1906 11/02/19 0511  AST 160* 145*  ALT 61* 50*  ALKPHOS 159* 119  BILITOT  1.1 1.0  PROT 7.5 5.6*  ALBUMIN 3.2* 2.5*   Recent Labs  Lab 11/01/19 1906  LIPASE 64*   Recent Labs  Lab 11/01/19 1915  AMMONIA 12   Coagulation Profile: No results for input(s): INR, PROTIME in the last 168 hours. Cardiac Enzymes: Recent Labs  Lab 11/01/19 1906  CKTOTAL 248   BNP (last 3 results) No results for input(s): PROBNP in the last 8760 hours. HbA1C: No results for input(s): HGBA1C in the last 72 hours. CBG: Recent Labs  Lab 11/01/19 2238  GLUCAP 85   Lipid Profile: No results for input(s): CHOL, HDL, LDLCALC, TRIG, CHOLHDL, LDLDIRECT in the last 72 hours. Thyroid Function Tests: No results for input(s): TSH, T4TOTAL, FREET4, T3FREE, THYROIDAB in the last 72 hours. Anemia Panel: No results for input(s): VITAMINB12, FOLATE, FERRITIN, TIBC, IRON, RETICCTPCT in the last 72 hours. Sepsis Labs: Recent Labs  Lab 11/01/19 1906 11/01/19 2057 11/02/19 0110 11/02/19 0511 11/03/19 0435  PROCALCITON 0.56  --   --  0.42 0.29  LATICACIDVEN 6.0* 4.9* 3.2* 2.3*  --     Recent Results (from the past 240 hour(s))  Culture, blood (routine x 2)     Status:  None (Preliminary result)   Collection Time: 11/01/19  6:55 PM   Specimen: BLOOD  Result Value Ref Range Status   Specimen Description BLOOD RIGHT ANTECUBITAL  Final   Special Requests   Final    BOTTLES DRAWN AEROBIC AND ANAEROBIC Blood Culture results may not be optimal due to an excessive volume of blood received in culture bottles   Culture   Final    NO GROWTH 3 DAYS Performed at [facility], [address] [place], [place] [address]    Report Status PENDING  Incomplete  Culture, blood (routine x 2)     Status: None (Preliminary result)   Collection Time: 11/01/19  7:15 PM   Specimen: BLOOD  Result Value Ref Range Status   Specimen Description BLOOD LEFT ANTECUBITAL  Final   Special Requests   Final    BOTTLES DRAWN AEROBIC AND ANAEROBIC Blood Culture results may not be optimal due to an  excessive volume of blood received in culture bottles   Culture   Final    NO GROWTH 3 DAYS Performed at [facility], [address], [place], [place] [address]    Report Status PENDING  Incomplete  Respiratory Panel by RT PCR (Flu A&B, Covid) - Nasopharyngeal Swab     Status: None   Collection Time: 11/01/19  7:15 PM   Specimen: Nasopharyngeal Swab  Result Value Ref Range Status   SARS Coronavirus 2 by RT PCR NEGATIVE NEGATIVE Final    Comment: (NOTE) SARS-CoV-2 target nucleic acids are NOT DETECTED. The SARS-CoV-2 RNA is generally detectable in upper respiratoy specimens during the acute phase of infection. The lowest concentration of SARS-CoV-2 viral copies this assay can detect is 131 copies/mL. A negative result does not preclude SARS-Cov-2 infection and should not be used as the sole basis for treatment or other Terry Clark management decisions. A negative result may occur with  improper specimen collection/handling, submission of specimen other than nasopharyngeal swab, presence of viral mutation(s) within the areas targeted by this assay, and inadequate number of viral copies (<131 copies/mL). A negative result must be combined with clinical observations, Terry Clark history, and epidemiological information. The expected result is Negative. Fact Sheet for Patients:  https://www.moore.com/ Fact Sheet for Healthcare Providers:  https://www.young.biz/ This test is not yet ap proved or cleared by the [place] FDA and  has been authorized for detection and/or diagnosis of SARS-CoV-2 by FDA under an Emergency Use Authorization (EUA). This EUA will remain  in effect (meaning this test can be used) for the duration of the COVID-19 declaration under Section 564(b)(1) of the Act, 21 U.S.C. section 360bbb-3(b)(1), unless the authorization is terminated or revoked sooner.    Influenza A by PCR NEGATIVE NEGATIVE Final   Influenza B by PCR  NEGATIVE NEGATIVE Final    Comment: (NOTE) The Xpert Xpress SARS-CoV-2/FLU/RSV assay is intended as an aid in  the diagnosis of influenza from Nasopharyngeal swab specimens and  should not be used as a sole basis for treatment. Nasal washings and  aspirates are unacceptable for Xpert Xpress SARS-CoV-2/FLU/RSV  testing. Fact Sheet for Patients: https://www.moore.com/ Fact Sheet for Healthcare Providers: https://www.young.biz/ This test is not yet approved or cleared by the [place] FDA and  has been authorized for detection and/or diagnosis of SARS-CoV-2 by  FDA under an Emergency Use Authorization (EUA). This EUA will remain  in effect (meaning this test can be used) for the duration of the  Covid-19 declaration under Section 564(b)(1) of the Act, 21  U.S.C. section 360bbb-3(b)(1), unless the authorization is  terminated or revoked. Performed at [facility], [address], [place], [place] [address]          Radiology Studies: CT CHEST W CONTRAST  Result Date: 11/02/2019 CLINICAL DATA:  Possible left lung cavitary lesion on recent chest x-ray. EXAM: CT CHEST WITH CONTRAST TECHNIQUE: Multidetector CT imaging of the chest was performed during intravenous contrast administration. IV mechanical malfunction during contrast injection is only a small amount of intravenous contrast was administered for the exam. Contrast injection was not repeated. CONTRAST:  18mL OMNIPAQUE IOHEXOL 300 MG/ML  SOLN COMPARISON:  07/27/2018 FINDINGS: Cardiovascular: Heart is normal size. Mild calcified plaque over the 3 vessel coronary arteries. Thoracic aorta is normal in caliber. Remaining vascular structures are unremarkable. Mediastinum/Nodes: No evidence of mediastinal or hilar adenopathy. Remaining mediastinal structures are normal. Lungs/Pleura: Lungs are adequately inflated with minimal dependent bibasilar atelectasis. No evidence of nodule or cavitary  process within the left lung. There is a focal somewhat linear hazy focus of airspace opacification over the periphery of the anterior right upper lobe which may be due to atelectasis or infection. No effusion. Airways are normal. Upper Abdomen: No acute findings. Musculoskeletal: Stable mild compression deformities of T5 through T7. Mild degenerative changes of the spine. IMPRESSION: 1. No evidence of cavitary lesion over the left lung. Hazy peripheral linear area of opacification over the anterior right upper lobe which may be due to atelectasis or infection. Recommend follow-up CT 4-6 weeks. 2.  Three stable mild compression deformities involving T5-T7. 3.  Atherosclerotic coronary artery disease. Electronically Signed   By: Marin Olp M.D.   On: 11/02/2019 11:43        Scheduled Meds: . Chlorhexidine Gluconate Cloth  6 each Topical Daily  . enoxaparin (LOVENOX) injection  40 mg Subcutaneous Q24H  . folic acid  1 mg Oral Daily  . multivitamin with minerals  1 tablet Oral Daily  . pantoprazole  40 mg Oral Daily  . phenobarbital  64.8 mg Oral BID   Followed by  . [START ON 11/05/2019] phenobarbital  32.4 mg Oral BID  . [START ON 11/07/2019] thiamine  100 mg Oral Daily   Continuous Infusions: . sodium chloride Stopped (11/02/19 0959)  . ampicillin-sulbactam (UNASYN) IV 3 g (11/04/19 0659)  . dextrose 5% lactated ringers 100 mL/hr at 11/04/19 0002  . thiamine injection 500 mg (11/03/19 0806)     LOS: 3 days    Time spent: 40 to 45 minutes    Ezekiel Slocumb, DO [facility]   If 7PM-7AM, please contact night-coverage [email or web address] 11/04/2019, 8:02 AM

## 2019-11-04 NOTE — Consult Note (Signed)
PHARMACY CONSULT NOTE - FOLLOW UP  Pharmacy Consult for Electrolyte Monitoring and Replacement   Recent Labs: Potassium (mmol/L)  Date Value  11/04/2019 3.3 (L)   Magnesium (mg/dL)  Date Value  35/33/1740 1.7   Calcium (mg/dL)  Date Value  99/27/8004 7.7 (L)   Albumin (g/dL)  Date Value  47/15/8063 2.5 (L)   Phosphorus (mg/dL)  Date Value  86/85/4883 2.2 (L)   Sodium (mmol/L)  Date Value  11/04/2019 132 (L)     Assessment: Terry Clark is a 49 y.o. male admitted on 11/01/2019 with pneumonia. Pt was admitted to ED for hypothermia due to exposure while intoxicated. Pharmacy was consulted for electrolyte management.   Goal of Therapy:  Electrolytes WNL   Plan:  Patient on D5/LR at 194mL/hr.   Will order potassium PO x 1 and magnesium 2g IV x 1.   Will continue to replace for goal potassium ~ 4 and goal magnesium ~ 2.   Pharmacy will continue to monitor and adjust per consult.   Flossie Wexler L  11/04/2019 4:46 PM.

## 2019-11-04 NOTE — Consult Note (Signed)
Pharmacy Antibiotic Note  Terry Clark is a 49 y.o. male admitted on 11/01/2019 with pneumonia. Pt was admitted to ED for hypothermia due to exposure while intoxicated. UA negative for UTI. Chest x-ray shows small patch of pneumonia, suspect aspiration pneumonia per morning round. D/c ceftriaxone and start ampicillin-sulbactam. Pharmacy has been consulted for ampicillin-sulbactam dosing. Pt received last dose of ceftriaxone on 0221 2206.   Plan: Continue ampicillin-sulbactam 3g 100 ml IVPB every 6 hours.   Height: 5\' 7"  (170.2 cm) Weight: 140 lb 6.9 oz (63.7 kg) IBW/kg (Calculated) : 66.1  Temp (24hrs), Avg:98.3 F (36.8 C), Min:98.1 F (36.7 C), Max:98.6 F (37 C)  Recent Labs  Lab 11/01/19 1906 11/01/19 2057 11/02/19 0110 11/02/19 0511 11/03/19 0435 11/04/19 0428  WBC 5.7  --   --  5.0 3.3* 3.7*  CREATININE 0.68  --   --  0.66 0.57* 0.52*  LATICACIDVEN 6.0* 4.9* 3.2* 2.3*  --   --     Estimated Creatinine Clearance: 101.7 mL/min (A) (by C-G formula based on SCr of 0.52 mg/dL (L)).    No Known Allergies  Antimicrobials this admission: Ceftriaxone 2/20 >> 2/22 Ampicillin-sulbactam 2/22 >>   Dose adjustments this admission:   Microbiology results: 2/20 BCx: no growth x 3 days   Thank you for allowing pharmacy to be a part of this patient's care.  Merri Dimaano L  11/04/2019 4:45 PM

## 2019-11-05 DIAGNOSIS — F10231 Alcohol dependence with withdrawal delirium: Secondary | ICD-10-CM

## 2019-11-05 DIAGNOSIS — G9341 Metabolic encephalopathy: Secondary | ICD-10-CM | POA: Diagnosis present

## 2019-11-05 DIAGNOSIS — R7401 Elevation of levels of liver transaminase levels: Secondary | ICD-10-CM | POA: Diagnosis present

## 2019-11-05 DIAGNOSIS — D696 Thrombocytopenia, unspecified: Secondary | ICD-10-CM

## 2019-11-05 DIAGNOSIS — D649 Anemia, unspecified: Secondary | ICD-10-CM | POA: Diagnosis not present

## 2019-11-05 DIAGNOSIS — E876 Hypokalemia: Secondary | ICD-10-CM

## 2019-11-05 LAB — COMPREHENSIVE METABOLIC PANEL
ALT: 40 U/L (ref 0–44)
AST: 79 U/L — ABNORMAL HIGH (ref 15–41)
Albumin: 2.4 g/dL — ABNORMAL LOW (ref 3.5–5.0)
Alkaline Phosphatase: 125 U/L (ref 38–126)
Anion gap: 6 (ref 5–15)
BUN: 8 mg/dL (ref 6–20)
CO2: 29 mmol/L (ref 22–32)
Calcium: 8.1 mg/dL — ABNORMAL LOW (ref 8.9–10.3)
Chloride: 100 mmol/L (ref 98–111)
Creatinine, Ser: 0.53 mg/dL — ABNORMAL LOW (ref 0.61–1.24)
GFR calc Af Amer: >60 mL/min (ref >60)
GFR calc non Af Amer: >60 mL/min (ref >60)
Glucose, Bld: 100 mg/dL — ABNORMAL HIGH (ref 70–99)
Potassium: 3.4 mmol/L — ABNORMAL LOW (ref 3.5–5.1)
Sodium: 135 mmol/L (ref 135–145)
Total Bilirubin: 0.8 mg/dL (ref 0.3–1.2)
Total Protein: 5.7 g/dL — ABNORMAL LOW (ref 6.5–8.1)

## 2019-11-05 LAB — CBC WITH DIFFERENTIAL/PLATELET
Abs Immature Granulocytes: 0.03 10*3/uL (ref 0.00–0.07)
Basophils Absolute: 0 10*3/uL (ref 0.0–0.1)
Basophils Relative: 1 %
Eosinophils Absolute: 0.1 10*3/uL (ref 0.0–0.5)
Eosinophils Relative: 2 %
HCT: 26.5 % — ABNORMAL LOW (ref 39.0–52.0)
Hemoglobin: 8.7 g/dL — ABNORMAL LOW (ref 13.0–17.0)
Immature Granulocytes: 1 %
Lymphocytes Relative: 18 %
Lymphs Abs: 0.8 10*3/uL (ref 0.7–4.0)
MCH: 31.2 pg (ref 26.0–34.0)
MCHC: 32.8 g/dL (ref 30.0–36.0)
MCV: 95 fL (ref 80.0–100.0)
Monocytes Absolute: 0.6 10*3/uL (ref 0.1–1.0)
Monocytes Relative: 13 %
Neutro Abs: 3 10*3/uL (ref 1.7–7.7)
Neutrophils Relative %: 65 %
Platelets: 83 10*3/uL — ABNORMAL LOW (ref 150–400)
RBC: 2.79 MIL/uL — ABNORMAL LOW (ref 4.22–5.81)
RDW: 14 % (ref 11.5–15.5)
WBC: 4.5 10*3/uL (ref 4.0–10.5)
nRBC: 0 % (ref 0.0–0.2)

## 2019-11-05 LAB — PHOSPHORUS: Phosphorus: 3 mg/dL (ref 2.5–4.6)

## 2019-11-05 LAB — MAGNESIUM: Magnesium: 1.8 mg/dL (ref 1.7–2.4)

## 2019-11-05 MED ORDER — POTASSIUM CHLORIDE CRYS ER 20 MEQ PO TBCR
40.0000 meq | EXTENDED_RELEASE_TABLET | Freq: Once | ORAL | Status: AC
  Administered 2019-11-05: 10:00:00 40 meq via ORAL
  Filled 2019-11-05: qty 2

## 2019-11-05 MED ORDER — LORAZEPAM 1 MG PO TABS
1.0000 mg | ORAL_TABLET | ORAL | Status: DC | PRN
  Administered 2019-11-05: 3 mg via ORAL
  Administered 2019-11-06 (×2): 1 mg via ORAL
  Administered 2019-11-06: 03:00:00 2 mg via ORAL
  Administered 2019-11-06 – 2019-11-07 (×2): 1 mg via ORAL
  Filled 2019-11-05 (×2): qty 2
  Filled 2019-11-05 (×2): qty 1
  Filled 2019-11-05: qty 3
  Filled 2019-11-05 (×2): qty 1

## 2019-11-05 MED ORDER — LORAZEPAM 2 MG/ML IJ SOLN
1.0000 mg | INTRAMUSCULAR | Status: DC | PRN
  Administered 2019-11-05: 1 mg via INTRAVENOUS
  Administered 2019-11-05: 21:00:00 2 mg via INTRAVENOUS
  Administered 2019-11-05 (×3): 1 mg via INTRAVENOUS
  Administered 2019-11-06 – 2019-11-07 (×6): 2 mg via INTRAVENOUS
  Filled 2019-11-05 (×11): qty 1

## 2019-11-05 MED ORDER — PANTOPRAZOLE SODIUM 40 MG IV SOLR
40.0000 mg | Freq: Two times a day (BID) | INTRAVENOUS | Status: DC
  Administered 2019-11-05 – 2019-11-09 (×10): 40 mg via INTRAVENOUS
  Filled 2019-11-05 (×10): qty 40

## 2019-11-05 MED ORDER — MAGNESIUM SULFATE 2 GM/50ML IV SOLN
2.0000 g | Freq: Once | INTRAVENOUS | Status: AC
  Administered 2019-11-05: 2 g via INTRAVENOUS
  Filled 2019-11-05: qty 50

## 2019-11-05 NOTE — Consult Note (Signed)
PHARMACY CONSULT NOTE - FOLLOW UP  Pharmacy Consult for Electrolyte Monitoring and Replacement   Recent Labs: Potassium (mmol/L)  Date Value  11/05/2019 3.4 (L)   Magnesium (mg/dL)  Date Value  11/55/2080 1.8   Calcium (mg/dL)  Date Value  22/33/6122 8.1 (L)   Albumin (g/dL)  Date Value  44/97/5300 2.4 (L)   Phosphorus (mg/dL)  Date Value  51/06/2110 3.0   Sodium (mmol/L)  Date Value  11/05/2019 135     Assessment: Terry Clark is a 49 y.o. male admitted on 11/01/2019 with pneumonia. Pt was admitted to ED for hypothermia due to exposure while intoxicated. Patient continus to require lorazepam coverage secondary to elevated CIWA in setting of alcohol withdrawal. Pharmacy was consulted for electrolyte management.   Goal of Therapy:  Electrolytes WNL   Plan:  D5/LR at 148mL/hr discontinued this am.   Will order potassium PO x 1 and magnesium 2g IV x 1.   Will continue to replace for goal potassium ~ 4 and goal magnesium ~ 2.   Pharmacy will continue to monitor and adjust per consult.   Sanda Dejoy L  11/05/2019 1:48 PM.

## 2019-11-05 NOTE — Progress Notes (Signed)
Paged on-call NP to notify of CIWA of 21 and need for increased frequency of PRN ativan. NP to update CIWA orderset. Will cont. to monitor.

## 2019-11-05 NOTE — Consult Note (Signed)
[facility] GI Inpatient Consult Note   Terry Clark, M.D.  Reason for Consult: Anemia, acute, hemoccult positive stool.   Attending Requesting Consult: Terry Grandchild, DO  History of Present Illness: Terry Clark is a 49 y.o. male with a history of alcohol dependence presenting with weakness, altered mental status status post seizure at home 2 days ago.  Patient has history of alcohol withdrawal seizures as well.  He has in the intensive care unit, bed 10 and very somnolent after receiving Ativan for CIWA score of 21. Patient denies any acute problems other than he says he wants more Ativan.  He is agitated and unwilling to answer questions at this time, although he denies any overt bleeding.  Past Medical History:  Past Medical History:  Diagnosis Date  . Alcohol abuse   . Alcohol withdrawal seizure [facility])     Problem List: Patient Active Problem List   Diagnosis Date Noted  . Acute metabolic encephalopathy 11/05/2019  . Acute anemia 11/05/2019  . Thrombocytopenia (HCC) 11/05/2019  . Hypomagnesemia 11/05/2019  . Hypokalemia 11/05/2019  . Hypophosphatemia 11/05/2019  . Transaminitis 11/05/2019  . Hypothermia 11/01/2019  . Perceptual disturbances and seizures concurrent with and due to alcohol withdrawal (HCC) 05/26/2019  . Alcohol withdrawal (HCC) 02/26/2019  . Hematemesis   . GI bleed 09/17/2018  . Alcoholic intoxication with complication (HCC)   . Anger   . Subconjunctival hemorrhage of both eyes   . Alcohol withdrawal seizure (HCC) 06/13/2014  . Alcoholic hepatitis 06/13/2014    Past Surgical History: Past Surgical History:  Procedure Laterality Date  . ESOPHAGOGASTRODUODENOSCOPY N/A 09/18/2018   Procedure: ESOPHAGOGASTRODUODENOSCOPY (EGD);  Surgeon: Toney Reil, MD;  Location: [facility] ENDOSCOPY;  Service: Gastroenterology;  Laterality: N/A;  . TONSILLECTOMY      Allergies: No Known Allergies  Home Medications: Medications Prior to Admission   Medication Sig Dispense Refill Last Dose  . amLODipine (NORVASC) 10 MG tablet Take 1 tablet (10 mg total) by mouth daily. 30 tablet 0 5+ months  . chlordiazePOXIDE (LIBRIUM) 25 MG capsule Take 1 tablet by mouth 5 times a day on day 1, then decrease by 1 tablet daily until gone. 15 capsule 0 5+ months  . diazepam (VALIUM) 2 MG tablet Take 1 tablet (2 mg total) by mouth every 8 (eight) hours as needed for anxiety (tremmors). 15 tablet 0 5+ months  . famotidine (PEPCID) 20 MG tablet Take 1 tablet (20 mg total) by mouth 2 (two) times daily. (Patient not taking: Reported on 11/03/2019) 60 tablet 1 Not Taking  . folic acid (FOLVITE) 1 MG tablet Take 1 tablet (1 mg total) by mouth daily. 30 tablet 0 5+ months  . Multiple Vitamin (MULTIVITAMIN WITH MINERALS) TABS tablet Take 1 tablet by mouth daily. 30 tablet 0 5+ months  . thiamine 100 MG tablet Take 1 tablet (100 mg total) by mouth daily. 30 tablet 0 5+ months   Home medication reconciliation was completed with the patient.   Scheduled Inpatient Medications:   . Chlorhexidine Gluconate Cloth  6 each Topical Daily  . folic acid  1 mg Oral Daily  . multivitamin with minerals  1 tablet Oral Daily  . pantoprazole (PROTONIX) IV  40 mg Intravenous BID  . phenobarbital  32.4 mg Oral BID  . [START ON 11/07/2019] thiamine  100 mg Oral Daily    Continuous Inpatient Infusions:   . sodium chloride Stopped (11/02/19 0959)  . thiamine injection 500 mg (11/05/19 0830)    PRN Inpatient  Medications:  acetaminophen, LORazepam **OR** LORazepam, ondansetron (ZOFRAN) IV  Family History: family history includes Cancer in an other family member; Kidney failure in his mother.   GI Family History: Unobtainable from agitated patient  Social History:   reports that he has been smoking. He has been smoking about 1.00 pack per day. He has never used smokeless tobacco. He reports current alcohol use. He reports previous drug use. The patient denies ETOH, tobacco, or  drug use.    Review of Systems: Review of Systems - Largely unobtainable from agitated with patient in alcoholic withdrawal  Physical Examination: BP (!) 151/92   Pulse 74   Temp 98.8 F (37.1 C) (Oral)   Resp 17   Ht 5\' 7"  (1.702 m)   Wt 65.7 kg   SpO2 100%   BMI 22.69 kg/m  Physical Exam Vitals and nursing note reviewed.  Constitutional:      Appearance: He is underweight. He is ill-appearing.  HENT:     Head: Normocephalic. No raccoon eyes or masses.  Eyes:     General: No scleral icterus.    Extraocular Movements: Extraocular movements intact.  Neck:     Vascular: Normal carotid pulses. No carotid bruit.     Trachea: Trachea normal.  Cardiovascular:     Rate and Rhythm: Tachycardia present.     Chest Wall: PMI is not displaced.     Heart sounds: S1 normal and S2 normal.  Pulmonary:     Effort: Pulmonary effort is normal.     Breath sounds: Normal breath sounds.  Chest:     Chest wall: No mass or lacerations.  Abdominal:     General: Bowel sounds are normal. There is no distension. There are no signs of injury.     Palpations: Abdomen is soft.     Tenderness: There is abdominal tenderness. There is no guarding or rebound. Negative signs include Rovsing's sign.     Hernia: No hernia is present.  Musculoskeletal:     Right shoulder: Normal.     Left shoulder: Normal.     Right upper arm: Normal.     Left upper arm: Normal.     Cervical back: Normal, normal range of motion and neck supple. No edema.     Thoracic back: Normal.     Lumbar back: Normal.  Lymphadenopathy:     Cervical: Cervical adenopathy present.     Right cervical: No superficial cervical adenopathy.    Left cervical: Superficial cervical adenopathy present.  Skin:    General: Skin is warm.     Capillary Refill: Capillary refill takes less than 2 seconds.     Coloration: Skin is not ashen or cyanotic.     Findings: No abrasion or abscess.     Nails: There is no clubbing.  Neurological:      Mental Status: He is lethargic.     Cranial Nerves: Cranial nerves are intact.     Sensory: Sensation is intact.     Motor: Motor function is intact.  Psychiatric:        Attention and Perception: He is inattentive.        Mood and Affect: Affect is angry.        Speech: Speech is slurred.        Behavior: Behavior is uncooperative, agitated and slowed.        Thought Content: Thought content does not include homicidal or suicidal ideation.        Cognition and Memory: Cognition  is impaired.        Judgment: Judgment is inappropriate.     Data: Lab Results  Component Value Date   WBC 4.5 11/05/2019   HGB 8.7 (L) 11/05/2019   HCT 26.5 (L) 11/05/2019   MCV 95.0 11/05/2019   PLT 83 (L) 11/05/2019   Recent Labs  Lab 11/03/19 0435 11/04/19 0428 11/05/19 0453  HGB 8.2* 8.4* 8.7*   Lab Results  Component Value Date   NA 135 11/05/2019   K 3.4 (L) 11/05/2019   CL 100 11/05/2019   CO2 29 11/05/2019   BUN 8 11/05/2019   CREATININE 0.53 (L) 11/05/2019   Lab Results  Component Value Date   ALT 40 11/05/2019   AST 79 (H) 11/05/2019   ALKPHOS 125 11/05/2019   BILITOT 0.8 11/05/2019   No results for input(s): APTT, INR, PTT in the last 168 hours. CBC Latest Ref Rng & Units 11/05/2019 11/04/2019 11/03/2019  WBC 4.0 - 10.5 K/uL 4.5 3.7(L) 3.3(L)  Hemoglobin 13.0 - 17.0 g/dL 8.3(T) 5.1(V) 6.1(Y)  Hematocrit 39.0 - 52.0 % 26.5(L) 25.3(L) 24.7(L)  Platelets 150 - 400 K/uL 83(L) 90(L) 104(L)    STUDIES: No results found. @IMAGES @  Assessment:  1. Anemia - Appears pancytopenic from recent alcohol binge with resultant bone marrow suppression. No obvious acute bleeding to explain downward trend in Hgb over the past 4 days.  2. Hemoccult positive stool - Workup is warranted, but likely will be done on an elective, non-urgent basis given lack of overt GI hemorrhage.   3. Alcohol dependence with withdrawal.  4. Hypothermia due to prolonged exposure to the outdoor elements.  Improving.  COVID-19 status: Tested negative      Recommendations  1. Continue current measures to treat alcohol withdrawal and encephalopathy.  2. Outpatient EGD and colonoscopy at some point. Patient's alcoholism is responsible for the Pancytopenia and no acute endoluminal evaluation is warranted.  3. GI sign off at this time. Call me back for clinical changes such as frank GI bleeding.  Thank you for the consult. Please call with questions or concerns.  , "Rosina Lowenstein MD [facility] [address] [place], [place] [phone]  11/05/2019 3:03 PM

## 2019-11-05 NOTE — Progress Notes (Signed)
PT Cancellation Note  Patient Details Name: Terry Clark MRN: [id number] DOB: 1971/06/24   Cancelled Treatment:    Reason Eval/Treat Not Completed: Other (comment).  Chart reviewed.  Nurse currently recommending holding PT at this time (d/t withdrawal symptom concerns).  Will re-attempt PT treatment session at a later date and time.  Hendricks Limes, PT 11/05/19, 2:31 PM

## 2019-11-05 NOTE — Progress Notes (Addendum)
PROGRESS NOTE    Terry Clark  PXT:[id number] DOB: [birth year] DOA: 11/01/2019  PCP: Patient, [name]    LOS - 4   Brief Narrative:  49 year old male with history of alcohol dependence and withdrawal seizures was admitted to ICU on 2/20 after presenting with altered mental status, severe hypothermia (85.53F), bradycardia with heart rate in the 40s, lactic acidosis and hypotension which developed during the rewarming process, required Neo-Synephrine.  Patient had reportedly been found behind a convenience store sitting in the mud.  Chest x-ray on admission showed a potential cavitary lesion in the left midlung zone.  CT chest was obtained for further characterization, cavitary lesion was ruled out, but showed hazy opacity seen in the right upper lobe may represent atelectasis, repeat CT in 4 to 6 weeks was recommended.  Hypothermia and hypotension did resolve and patient was transferred to hospitalist service as of 2/23  Subjective 2/24: Overnight, patient had CIWA score 21.  Frequency of CIWA increased to every hour.  This AM, patient sleeping comfortably but wakes to voice.  Reports feeling weak because of not eating.  Asks to have juice.  No fever/chills, SOB, chest pain or other complaints at this time.  Assessment & Plan:   Principal Problem:   Alcohol withdrawal (HCC) Active Problems:   GI bleed   Acute metabolic encephalopathy   Acute anemia   Thrombocytopenia (HCC)   Hypomagnesemia   Hypokalemia   Hypophosphatemia   Hypothermia   Transaminitis   Acute anemia -etiology to be determined.  Patient had normal hemoglobin of 13.6 back on 10/22/2019.  Upon admission, hemoglobin 11.4, has down trended to 8.8 >> 8.2 >>8.4 since admission without any evidence of active bleeding.  Likely at least partially dilutional as patient's white count and platelets have also decreased since admission.  However, FOBT positive on 2/23. --GI consulted --follow up anemia panel --monitor CBC  daily --transfuse if Hbg < 7.0  Acute metabolic encephalopathy secondary to EtOH intoxication Alcohol Withdrawal Syndrome Alcohol level 467 on admission.  Patient with history of withdrawal seizures and alcohol dependence.  UDS negative. --Monitor by CIWA protocol for signs of withdrawal --Continue phenobarbital taper as ordered --Continue high-dose IV thiamine x5 days --Continue thiamine 100 mg p.o. daily --Neurochecks --Monitor for seizures --Avoid sedating meds as much as possible --Will consider CT head if not improving, but seems to be improving well  Hypothermia secondary to cold exposure -resolved Sepsis work-up was negative, blood cultures still pending.  Has been on empiric antibiotics, currently Unasyn.  Patient is without respiratory symptoms, afebrile and without leukocytosis and had normal procalcitonin. --Will stop antibiotics and monitor clinically for signs of infection --Follow-up cultures (NGTD x 4 days)  Possible pulmonary cavitary lesion, seen on initial chest x-ray.  Ruled out on CT chest. --Repeat CT chest in 4 to 6 weeks recommended per radiologist.  Mild transaminitis -likely secondary to alcohol abuse --Monitor LFTs --Continue Protonix for GI prophylaxis  Thrombocytopenia -new, possibly dilutional as hemoglobin and white counts have also trended downward.  However concern for liver disease given chronic alcohol abuse. --Hold pharmacologic VTE prophylaxis --SCDs --CBCs daily to monitor  F / E / N: Hypokalemia Leary Roca /hypomagnesemia -all likely due to alcohol abuse. --Pharmacy consulted for electrolyte replacement    DVT prophylaxis: SCDs   Code Status: Full Code  Family Communication: None at bedside during encounter  Disposition Plan: To be determined, expect discharge to prior home environment pending further improvement. Coming From home Exp DC Date: To be  determined Barriers: Current medical condition altered mental  status Medically Stable for Discharge?  No  Consultants:   Gastroenterology  Pharmacy  Transitions of care  Procedures:   None  Antimicrobials:   Rocephin -2/20 >> 2/22  Unasyn -2/22 >> 2/23   Objective: Vitals:   11/05/19 0700 11/05/19 0800 11/05/19 0900 11/05/19 1000  BP: (!) 146/95 (!) 161/104 (!) 147/109 (!) 151/92  Pulse: 82 70 79 74  Resp: 16 11 10 17   Temp:      TempSrc:      SpO2:  100% 100% 100%  Weight:      Height:        Intake/Output Summary (Last 24 hours) at 11/05/2019 1157 Last data filed at 11/05/2019 1100 Gross per 24 hour  Intake 1667.47 ml  Output 5025 ml  Net -3357.53 ml   Filed Weights   11/02/19 0404 11/03/19 0430 11/05/19 0500  Weight: 57.8 kg 63.7 kg 65.7 kg    Examination:  General exam: sleeping, awakes to voice, no acute distress HEENT: atraumatic, clear conjunctiva, anicteric sclera, moist mucus membranes, hearing grossly normal  Respiratory system: CTAB, no wheezes, rales or rhonchi, normal respiratory effort. Cardiovascular system: normal S1/S2, RRR, no JVD, murmurs, rubs, gallops, no pedal edema.   Gastrointestinal system: soft, mildly tender on palpation without guarding or rebound, ND, +bowel sounds. Central nervous system: A&O x3. no gross focal neurologic deficits, normal speech Extremities: moves all, normal tone Skin: dry, intact, normal temperature    Data Reviewed: I have personally reviewed following labs and imaging studies  CBC: Recent Labs  Lab 11/01/19 1906 11/02/19 0511 11/03/19 0435 11/04/19 0428 11/05/19 0453  WBC 5.7 5.0 3.3* 3.7* 4.5  NEUTROABS 4.3  --   --   --  3.0  HGB 11.4* 8.8* 8.2* 8.4* 8.7*  HCT 34.1* 25.8* 24.7* 25.3* 26.5*  MCV 93.4 93.1 94.3 94.1 95.0  PLT 189 157 104* 90* 83*   Basic Metabolic Panel: Recent Labs  Lab 11/01/19 1906 11/02/19 0511 11/03/19 0435 11/04/19 0428 11/05/19 0453  NA 133* 141 137 132* 135  K 3.8 3.6 3.0* 3.3* 3.4*  CL 96* 108 102 98 100  CO2 22  21* 27 29 29   GLUCOSE 176* 76 109* 141* 100*  BUN 13 11 10 6 8   CREATININE 0.68 0.66 0.57* 0.52* 0.53*  CALCIUM 8.1* 7.0* 7.5* 7.7* 8.1*  MG 2.1 1.6*  --  1.7 1.8  PHOS  --   --  2.1* 2.2* 3.0   GFR: Estimated Creatinine Clearance: 104.9 mL/min (A) (by C-G formula based on SCr of 0.53 mg/dL (L)). Liver Function Tests: Recent Labs  Lab 11/01/19 1906 11/02/19 0511 11/05/19 0453  AST 160* 145* 79*  ALT 61* 50* 40  ALKPHOS 159* 119 125  BILITOT 1.1 1.0 0.8  PROT 7.5 5.6* 5.7*  ALBUMIN 3.2* 2.5* 2.4*   Recent Labs  Lab 11/01/19 1906  LIPASE 64*   Recent Labs  Lab 11/01/19 1915  AMMONIA 12   Coagulation Profile: No results for input(s): INR, PROTIME in the last 168 hours. Cardiac Enzymes: Recent Labs  Lab 11/01/19 1906  CKTOTAL 248   BNP (last 3 results) No results for input(s): PROBNP in the last 8760 hours. HbA1C: No results for input(s): HGBA1C in the last 72 hours. CBG: Recent Labs  Lab 11/01/19 2238  GLUCAP 85   Lipid Profile: No results for input(s): CHOL, HDL, LDLCALC, TRIG, CHOLHDL, LDLDIRECT in the last 72 hours. Thyroid Function Tests: No results for input(s): TSH,  T4TOTAL, FREET4, T3FREE, THYROIDAB in the last 72 hours. Anemia Panel: No results for input(s): VITAMINB12, FOLATE, FERRITIN, TIBC, IRON, RETICCTPCT in the last 72 hours. Sepsis Labs: Recent Labs  Lab 11/01/19 1906 11/01/19 2057 11/02/19 0110 11/02/19 0511 11/03/19 0435  PROCALCITON 0.56  --   --  0.42 0.29  LATICACIDVEN 6.0* 4.9* 3.2* 2.3*  --     Recent Results (from the past 240 hour(s))  Culture, blood (routine x 2)     Status: None (Preliminary result)   Collection Time: 11/01/19  6:55 PM   Specimen: BLOOD  Result Value Ref Range Status   Specimen Description BLOOD RIGHT ANTECUBITAL  Final   Special Requests   Final    BOTTLES DRAWN AEROBIC AND ANAEROBIC Blood Culture results may not be optimal due to an excessive volume of blood received in culture bottles   Culture    Final    NO GROWTH 4 DAYS Performed at [facility], [address] [place], [place] [address]    Report Status PENDING  Incomplete  Culture, blood (routine x 2)     Status: None (Preliminary result)   Collection Time: 11/01/19  7:15 PM   Specimen: BLOOD  Result Value Ref Range Status   Specimen Description BLOOD LEFT ANTECUBITAL  Final   Special Requests   Final    BOTTLES DRAWN AEROBIC AND ANAEROBIC Blood Culture results may not be optimal due to an excessive volume of blood received in culture bottles   Culture   Final    NO GROWTH 4 DAYS Performed at [facility], [address], [place], [place] [address]    Report Status PENDING  Incomplete  Respiratory Panel by RT PCR (Flu A&B, Covid) - Nasopharyngeal Swab     Status: None   Collection Time: 11/01/19  7:15 PM   Specimen: Nasopharyngeal Swab  Result Value Ref Range Status   SARS Coronavirus 2 by RT PCR NEGATIVE NEGATIVE Final    Comment: (NOTE) SARS-CoV-2 target nucleic acids are NOT DETECTED. The SARS-CoV-2 RNA is generally detectable in upper respiratoy specimens during the acute phase of infection. The lowest concentration of SARS-CoV-2 viral copies this assay can detect is 131 copies/mL. A negative result does not preclude SARS-Cov-2 infection and should not be used as the sole basis for treatment or other patient management decisions. A negative result may occur with  improper specimen collection/handling, submission of specimen other than nasopharyngeal swab, presence of viral mutation(s) within the areas targeted by this assay, and inadequate number of viral copies (<131 copies/mL). A negative result must be combined with clinical observations, patient history, and epidemiological information. The expected result is Negative. Fact Sheet for Patients:  PinkCheek.be Fact Sheet for Healthcare Providers:  GravelBags.it This test is not yet  ap proved or cleared by the [place] FDA and  has been authorized for detection and/or diagnosis of SARS-CoV-2 by FDA under an Emergency Use Authorization (EUA). This EUA will remain  in effect (meaning this test can be used) for the duration of the COVID-19 declaration under Section 564(b)(1) of the Act, 21 U.S.C. section 360bbb-3(b)(1), unless the authorization is terminated or revoked sooner.    Influenza A by PCR NEGATIVE NEGATIVE Final   Influenza B by PCR NEGATIVE NEGATIVE Final    Comment: (NOTE) The Xpert Xpress SARS-CoV-2/FLU/RSV assay is intended as an aid in  the diagnosis of influenza from Nasopharyngeal swab specimens and  should not be used as a sole basis for treatment. Nasal washings and  aspirates are unacceptable for Xpert Xpress SARS-CoV-2/FLU/RSV  testing. Fact Sheet for Patients: https://www.moore.com/ Fact Sheet for Healthcare Providers: https://www.young.biz/ This test is not yet approved or cleared by the [place] FDA and  has been authorized for detection and/or diagnosis of SARS-CoV-2 by  FDA under an Emergency Use Authorization (EUA). This EUA will remain  in effect (meaning this test can be used) for the duration of the  Covid-19 declaration under Section 564(b)(1) of the Act, 21  U.S.C. section 360bbb-3(b)(1), unless the authorization is  terminated or revoked. Performed at [facility], [address], [place], [place] [address]          Radiology Studies: No results found.      Scheduled Meds: . Chlorhexidine Gluconate Cloth  6 each Topical Daily  . folic acid  1 mg Oral Daily  . multivitamin with minerals  1 tablet Oral Daily  . pantoprazole (PROTONIX) IV  40 mg Intravenous BID  . phenobarbital  32.4 mg Oral BID  . [START ON 11/07/2019] thiamine  100 mg Oral Daily   Continuous Infusions: . sodium chloride Stopped (11/02/19 0959)  . thiamine injection 500 mg (11/05/19 0830)      LOS: 4 days    Time spent: 35 to 40 minutes    Pennie Banter, DO [facility]   If 7PM-7AM, please contact night-coverage [email or web address] 11/05/2019, 11:57 AM

## 2019-11-06 DIAGNOSIS — D696 Thrombocytopenia, unspecified: Secondary | ICD-10-CM

## 2019-11-06 DIAGNOSIS — R7401 Elevation of levels of liver transaminase levels: Secondary | ICD-10-CM

## 2019-11-06 LAB — PHOSPHORUS: Phosphorus: 3.3 mg/dL (ref 2.5–4.6)

## 2019-11-06 LAB — CULTURE, BLOOD (ROUTINE X 2)
Culture: NO GROWTH
Culture: NO GROWTH

## 2019-11-06 LAB — COMPREHENSIVE METABOLIC PANEL
ALT: 36 U/L (ref 0–44)
AST: 61 U/L — ABNORMAL HIGH (ref 15–41)
Albumin: 2.6 g/dL — ABNORMAL LOW (ref 3.5–5.0)
Alkaline Phosphatase: 128 U/L — ABNORMAL HIGH (ref 38–126)
Anion gap: 8 (ref 5–15)
BUN: 14 mg/dL (ref 6–20)
CO2: 27 mmol/L (ref 22–32)
Calcium: 8.2 mg/dL — ABNORMAL LOW (ref 8.9–10.3)
Chloride: 99 mmol/L (ref 98–111)
Creatinine, Ser: 0.73 mg/dL (ref 0.61–1.24)
GFR calc Af Amer: >60 mL/min (ref >60)
GFR calc non Af Amer: >60 mL/min (ref >60)
Glucose, Bld: 154 mg/dL — ABNORMAL HIGH (ref 70–99)
Potassium: 3.6 mmol/L (ref 3.5–5.1)
Sodium: 134 mmol/L — ABNORMAL LOW (ref 135–145)
Total Bilirubin: 0.7 mg/dL (ref 0.3–1.2)
Total Protein: 6 g/dL — ABNORMAL LOW (ref 6.5–8.1)

## 2019-11-06 LAB — CBC
HCT: 28.4 % — ABNORMAL LOW (ref 39.0–52.0)
Hemoglobin: 9.4 g/dL — ABNORMAL LOW (ref 13.0–17.0)
MCH: 31.4 pg (ref 26.0–34.0)
MCHC: 33.1 g/dL (ref 30.0–36.0)
MCV: 95 fL (ref 80.0–100.0)
Platelets: 100 10*3/uL — ABNORMAL LOW (ref 150–400)
RBC: 2.99 MIL/uL — ABNORMAL LOW (ref 4.22–5.81)
RDW: 14.1 % (ref 11.5–15.5)
WBC: 5.7 10*3/uL (ref 4.0–10.5)
nRBC: 0 % (ref 0.0–0.2)

## 2019-11-06 LAB — MAGNESIUM: Magnesium: 1.8 mg/dL (ref 1.7–2.4)

## 2019-11-06 LAB — MRSA PCR SCREENING: MRSA by PCR: NEGATIVE

## 2019-11-06 MED ORDER — GABAPENTIN 600 MG PO TABS
300.0000 mg | ORAL_TABLET | Freq: Three times a day (TID) | ORAL | Status: DC
  Administered 2019-11-06 (×2): 300 mg via ORAL
  Filled 2019-11-06 (×3): qty 0.5

## 2019-11-06 MED ORDER — MAGNESIUM SULFATE 2 GM/50ML IV SOLN
2.0000 g | Freq: Once | INTRAVENOUS | Status: AC
  Administered 2019-11-06: 11:00:00 2 g via INTRAVENOUS
  Filled 2019-11-06: qty 50

## 2019-11-06 MED ORDER — POTASSIUM CHLORIDE CRYS ER 20 MEQ PO TBCR
40.0000 meq | EXTENDED_RELEASE_TABLET | Freq: Once | ORAL | Status: AC
  Administered 2019-11-06: 11:00:00 40 meq via ORAL
  Filled 2019-11-06: qty 2

## 2019-11-06 NOTE — Progress Notes (Signed)
Physical Therapy Treatment Patient Details Name: Terry Clark MRN: [id number] DOB: 1971-03-27 Today's Date: 11/06/2019    History of Present Illness Pt is a 49 y.o. male presenting to hospital 11/01/19 with hypothermia (pt found confused and sitting in cold; core temp 86.2 prior to arrival to ED).  Per chart review, pt with h/o multiple admissions d/t alcohol intoxication.  Pt admitted with hypothermia secondary cold exposure, hypotension, AMS secondary ETOH intoxication, and possible small patch PNA.  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    PT Comments    Pt was supine in bed with HOB elevated ~ 20 degrees. He agrees to PT session and requested to use BR. BP 136/96 and HR 90 bpm at rest. O2 > 96% throughout. HR did elevate to 128 bpm after ambulating ~ 45 ft with RW. +2 assist for safety and equipment management. Pt was able to sit EOB x 5 minutes performing several exercises prior to standing with min assist to RW/ gait belt and ambulating to toilet. Pt had BM. Mod assist to stand from lower toilet height. He tolerated session well. Pt was repositioned in bed post session with bed alarm placed, call bell in reach and pillow placed under BLEs. PT will continue to follow per POC. Pt would benefit from SNF at D/C to address safety/mobility deficits.   Follow Up Recommendations  SNF     Equipment Recommendations  Other (comment)(defer to next level of care)    Recommendations for Other Services       Precautions / Restrictions Precautions Precautions: Fall Restrictions Weight Bearing Restrictions: No    Mobility  Bed Mobility Overal bed mobility: Needs Assistance Bed Mobility: Supine to Sit;Sit to Supine     Supine to sit: Min assist;HOB elevated;+2 for safety/equipment Sit to supine: Min assist;HOB elevated;+2 for  safety/equipment   General bed mobility comments: Pt was able to exit and re-enter bed with min assist + increased time to perform.   Transfers Overall transfer level: Needs assistance Equipment used: Rolling walker (2 wheeled) Transfers: Sit to/from Stand Sit to Stand: Min assist;Mod assist;+2 safety/equipment         General transfer comment: Pt was able to stand from elevated bed height with min assist. mod assist from toilet. Vcs for improved technique and sequencing  Ambulation/Gait Ambulation/Gait assistance: +2 safety/equipment;Min assist Gait Distance (Feet): 45 Feet Assistive device: Rolling walker (2 wheeled) Gait Pattern/deviations: Narrow base of support;Step-through pattern Gait velocity: decreased   General Gait Details: Pt was able to ambulated from EOB to toilet (ICU) then to doorway and return 2 x. Pt tends to ambulate with narrow BOS and required min assist throughout for safety. HR elevated to 128bpm but quickly decreases with seated rest.    Stairs             Wheelchair Mobility    Modified Rankin (Stroke Patients Only)       Balance Overall balance assessment: Needs assistance Sitting-balance support: No upper extremity supported;Feet supported Sitting balance-Leahy Scale: Good Sitting balance - Comments: pt sat EOB x 5 minutes without LOB   Standing balance support: Bilateral upper extremity supported Standing balance-Leahy Scale: Fair Standing balance comment: pt was able to stand without LOB with + 1 UE support.  Cognition Arousal/Alertness: Awake/alert Behavior During Therapy: WFL for tasks assessed/performed Overall Cognitive Status: Within Functional Limits for tasks assessed                                 General Comments: Oriented to setting and self but unable to correctly state day/time/etc.. He was cooperative and able to follow commands      Exercises      General  Comments        Pertinent Vitals/Pain Pain Assessment: 0-10 Pain Score: 6  Pain Location: L foot Pain Descriptors / Indicators: Constant Pain Intervention(s): Limited activity within patient's tolerance;Monitored during session    Home Living                      Prior Function            PT Goals (current goals can now be found in the care plan section) Acute Rehab PT Goals Patient Stated Goal: "To get stronger so I can out of here" Progress towards PT goals: Progressing toward goals    Frequency    Min 2X/week      PT Plan Current plan remains appropriate    Co-evaluation              AM-PAC PT "6 Clicks" Mobility   Outcome Measure  Help needed turning from your back to your side while in a flat bed without using bedrails?: A Little Help needed moving from lying on your back to sitting on the side of a flat bed without using bedrails?: A Little Help needed moving to and from a bed to a chair (including a wheelchair)?: A Little Help needed standing up from a chair using your arms (e.g., wheelchair or bedside chair)?: A Lot Help needed to walk in hospital room?: A Lot Help needed climbing 3-5 steps with a railing? : A Lot 6 Click Score: 15    End of Session Equipment Utilized During Treatment: Gait belt Activity Tolerance: Patient tolerated treatment well Patient left: in bed;with call bell/phone within reach;with bed alarm set Nurse Communication: Mobility status;Precautions PT Visit Diagnosis: Unsteadiness on feet (R26.81);Other abnormalities of gait and mobility (R26.89);Muscle weakness (generalized) (M62.81);Difficulty in walking, not elsewhere classified (R26.2);History of falling (Z91.81)     Time: 1543-1610 PT Time Calculation (min) (ACUTE ONLY): 27 min  Charges:  $Gait Training: 8-22 mins $Therapeutic Activity: 8-22 mins                     Julaine Fusi PTA 11/06/19, 4:33 PM

## 2019-11-06 NOTE — Progress Notes (Signed)
PT Cancellation Note  Patient Details Name: Terry Clark MRN: [id number] DOB: 1970/09/22   Cancelled Treatment:     PT attempt. PT hold. Pt just received lunch tray and polietly requesting therapist return at later time. Therapist will attempt to return later this date.     Rushie Chestnut 11/06/2019, 3:24 PM

## 2019-11-06 NOTE — Consult Note (Signed)
PHARMACY CONSULT NOTE - FOLLOW UP  Pharmacy Consult for Electrolyte Monitoring and Replacement   Recent Labs: Potassium (mmol/L)  Date Value  11/06/2019 3.6   Magnesium (mg/dL)  Date Value  43/73/5789 1.8   Calcium (mg/dL)  Date Value  78/47/8412 8.2 (L)   Albumin (g/dL)  Date Value  82/04/1387 2.6 (L)   Phosphorus (mg/dL)  Date Value  71/95/9747 3.3   Sodium (mmol/L)  Date Value  11/06/2019 134 (L)     Assessment: Terry Clark is a 49 y.o. male admitted on 11/01/2019 with hypothermia and alcohol intoxication. Patient continues to require lorazepam coverage secondary to elevated CIWA in setting of alcohol withdrawal. Pharmacy was consulted for electrolyte management.   Goal of Therapy:  Electrolytes WNL   Plan:  Will order potassium PO x 1 and magnesium 2g IV x 1.   Will continue to replace for goal potassium ~ 4 and goal magnesium ~ 2.   Pharmacy will continue to monitor and adjust per consult.   Raziel Koenigs L  11/06/2019 10:39 AM.

## 2019-11-06 NOTE — Progress Notes (Signed)
Terry Clark  CHY:[id number] DOB: 1971/08/22 DOA: 11/01/2019  PCP: Patient, [name]    LOS - 5   Brief Narrative:  49 year old male with history of alcohol dependence and withdrawal seizures was admitted to ICU on 2/20 after presenting with altered mental status, severe hypothermia (85.64F), bradycardia with heart rate in the 40s, lactic acidosis and hypotension which developed during the rewarming process, required Neo-Synephrine. Patient had reportedly been found behind a convenience store sitting in the mud. Chest x-ray on admission showed a potential cavitary lesion in the left midlung zone. CT chest was obtained for further characterization, cavitary lesion was ruled out, but showed hazy opacity seen in the right upper lobe may represent atelectasis, repeat CT in 4 to 6 weeks was recommended. Hypothermia and hypotension did resolve and patient was transferred to hospitalist service as of 2/23.  Phenobarbital taper completed and patient with worsening agitation/withdrawal requiring frequent Ativan per CIWA protocol.  Patient complains of bilateral foot pain suspicious for alcohol-induced peripheral neuropathy.  Will add gabapentin for this, which may help his withdrawal as well.   Subjective 2/25: Patient seen at bedside this morning.  Overnight, patient had CIWA score of 21 and required increased frequency of as needed Ativan.  Appears to be doing better today patient reports feeling generally weak, and has nausea but no vomiting.  Reports mild abdominal pain.  Was able to eat breakfast, but states he has to eat very slowly or he gets nauseous.  Denies fevers or chills.  He thinks his hand tremors are about the same or maybe a little improved.  Asked patient if he desires to avoid alcohol going forward, he states he does, however everyone in his social network also drink alcohol.  Today he also reports that both of his feet hurt, feels like pins-and-needles, and is  much worse on standing.  Assessment & Plan:   Principal Problem:   Alcohol withdrawal (HCC) Active Problems:   GI bleed   Acute metabolic encephalopathy   Acute anemia   Thrombocytopenia (HCC)   Hypomagnesemia   Hypokalemia   Hypophosphatemia   Hypothermia   Transaminitis   Pancytopenia - likely due to bone marrow suppression with EtOH toxicity vs dilutional from IV fluids.  Leukopenia resolved.  Platelets improving.  Anemia could be multifactorial, as below.  Acute anemia- with heme-positive stool and likely bone marrow suppression with alcohol. Patient had normal hemoglobin of 13.6 back on 10/22/2019. Upon admission, hemoglobin 11.4, has down trended to 8.8 >>8.2>>8.4 since admission without any evidence of active bleeding. However, FOBT positive on 2/23. --GI consulted, recommend outpatient evaluation in future --follow up anemia panel --monitor CBC daily --transfuse if Hbg < 7.0  Acute metabolic encephalopathy secondary to EtOH intoxication Alcohol Withdrawal Syndrome - very slow to improve. Alcohol level 467 on admission. Patient with history of withdrawal seizures and alcohol dependence. UDS negative.  Required Precedex.  Completed phenobarbital taper.  He expresses desire to avoid alcohol, but admits everyone in his social network also drink, which will be a major challenge. --Monitor byCIWAprotocol for signs of withdrawal --Continue high-dose IV thiamine x5 days --Continue thiamine 100 mg p.o. 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  No diagnosis of diabetes. --check A1C with AM labs --start gabapentin 300 mg TID (may  also help withdrawal)  Generalized Weakness - secondary to above and being in bed now several  days. --PT evaluation --up in chair as much as tolerated  Hypothermia secondary to cold exposure-resolved Sepsis work-up was negative,blood cultures still pending.Has been on empiric antibiotics, currently Unasyn. Patient is without respiratory symptoms, afebrile and without leukocytosis and had normal procalcitonin. --Will stop antibiotics and monitor clinically for signs of infection --Follow-up cultures (NGTD x 4 days)  Possible pulmonary cavitary lesion,seen on initial chest x-ray.Ruled out on CT chest. --Repeat CT chest in 4 to 6 weeks recommended per radiologist.  Mild transaminitis-likely secondary to alcohol abuse.  Improving. --Monitor LFTs --Continue Protonix for GI prophylaxis  Thrombocytopenia-new,possibly dilutional as hemoglobin and white counts have also trended downward. However concern for liver disease given chronic alcohol abuse. --Hold pharmacologic VTE prophylaxis --SCDs --CBCs daily to monitor  F / E / N: Hypokalemia/hypophosphatemia/hypomagnesemia-all likely due to alcohol abuse. --Pharmacy consulted for electrolyte replacement    DVT prophylaxis:SCDs Code Status: Full Code Family Communication:None at bedside during encounter  Disposition Plan:To be determined,expect discharge to prior home environment pending further improvement.   Coming Fromhome Exp DC Date:To be determined Barriers:Current medical condition, ongoing EtOH withdrawal syndrome  Medically Stable for Discharge?No  Consultants:  Gastroenterology  Pharmacy  Transitions of care  Procedures:  None  Antimicrobials:  Rocephin-2/20>>2/22  Unasyn-2/22>>2/23   Objective: Vitals:   11/06/19 0200 11/06/19 0300 11/06/19 0400 11/06/19 0500  BP: (!) 146/105 (!) 143/99 (!) 145/108 (!) 130/100  Pulse: 88 90 (!) 103 86  Resp: 12 11 16 13   Temp:      TempSrc:      SpO2: 98% 98% 93% 97%  Weight:      Height:        Intake/Output  Summary (Last 24 hours) at 11/06/2019 0851 Last data filed at 11/06/2019 0600 Gross per 24 hour  Intake 336.38 ml  Output 1200 ml  Net -863.62 ml   Filed Weights   11/02/19 0404 11/03/19 0430 11/05/19 0500  Weight: 57.8 kg 63.7 kg 65.7 kg    Examination:  General exam: awake, alert, no acute distress HEENT: atraumatic, clear conjunctiva, anicteric sclera, moist mucus membranes, hearing grossly normal  Respiratory system: CTAB, no wheezes, rales or rhonchi, normal respiratory effort. Cardiovascular system: normal S1/S2, RRR, no pedal edema.   Central nervous system: A&O x3. no gross focal neurologic deficits, normal speech Extremities: moves all, no edema, normal tone Psychiatry: normal mood, congruent affect    Data Reviewed: I have personally reviewed following labs and imaging studies  CBC: Recent Labs  Lab 11/01/19 1906 11/01/19 1906 11/02/19 0511 11/03/19 0435 11/04/19 0428 11/05/19 0453 11/06/19 0320  WBC 5.7   < > 5.0 3.3* 3.7* 4.5 5.7  NEUTROABS 4.3  --   --   --   --  3.0  --   HGB 11.4*   < > 8.8* 8.2* 8.4* 8.7* 9.4*  HCT 34.1*   < > 25.8* 24.7* 25.3* 26.5* 28.4*  MCV 93.4   < > 93.1 94.3 94.1 95.0 95.0  PLT 189   < > 157 104* 90* 83* 100*   < > = values in this interval not displayed.   Basic Metabolic Panel: Recent Labs  Lab 11/01/19 1906 11/01/19 1906 11/02/19 0511 11/03/19 0435 11/04/19 0428 11/05/19 0453 11/06/19 0320  NA 133*   < > 141 137 132* 135 134*  K 3.8   < > 3.6 3.0* 3.3* 3.4* 3.6  CL 96*   < >  108 102 98 100 99  CO2 22   < > 21* 27 29 29 27   GLUCOSE 176*   < > 76 109* 141* 100* 154*  BUN 13   < > 11 10 6 8 14   CREATININE 0.68   < > 0.66 0.57* 0.52* 0.53* 0.73  CALCIUM 8.1*   < > 7.0* 7.5* 7.7* 8.1* 8.2*  MG 2.1  --  1.6*  --  1.7 1.8 1.8  PHOS  --   --   --  2.1* 2.2* 3.0 3.3   < > = values in this interval not displayed.   GFR: Estimated Creatinine Clearance: 104.9 mL/min (by C-G formula based on SCr of 0.73 mg/dL). Liver  Function Tests: Recent Labs  Lab 11/01/19 1906 11/02/19 0511 11/05/19 0453 11/06/19 0320  AST 160* 145* 79* 61*  ALT 61* 50* 40 36  ALKPHOS 159* 119 125 128*  BILITOT 1.1 1.0 0.8 0.7  PROT 7.5 5.6* 5.7* 6.0*  ALBUMIN 3.2* 2.5* 2.4* 2.6*   Recent Labs  Lab 11/01/19 1906  LIPASE 64*   Recent Labs  Lab 11/01/19 1915  AMMONIA 12   Coagulation Profile: No results for input(s): INR, PROTIME in the last 168 hours. Cardiac Enzymes: Recent Labs  Lab 11/01/19 1906  CKTOTAL 248   BNP (last 3 results) No results for input(s): PROBNP in the last 8760 hours. HbA1C: No results for input(s): HGBA1C in the last 72 hours. CBG: Recent Labs  Lab 11/01/19 2238  GLUCAP 85   Lipid Profile: No results for input(s): CHOL, HDL, LDLCALC, TRIG, CHOLHDL, LDLDIRECT in the last 72 hours. Thyroid Function Tests: No results for input(s): TSH, T4TOTAL, FREET4, T3FREE, THYROIDAB in the last 72 hours. Anemia Panel: No results for input(s): VITAMINB12, FOLATE, FERRITIN, TIBC, IRON, RETICCTPCT in the last 72 hours. Sepsis Labs: Recent Labs  Lab 11/01/19 1906 11/01/19 2057 11/02/19 0110 11/02/19 0511 11/03/19 0435  PROCALCITON 0.56  --   --  0.42 0.29  LATICACIDVEN 6.0* 4.9* 3.2* 2.3*  --     Recent Results (from the past 240 hour(s))  Culture, blood (routine x 2)     Status: None   Collection Time: 11/01/19  6:55 PM   Specimen: BLOOD  Result Value Ref Range Status   Specimen Description BLOOD RIGHT ANTECUBITAL  Final   Special Requests   Final    BOTTLES DRAWN AEROBIC AND ANAEROBIC Blood Culture results may not be optimal due to an excessive volume of blood received in culture bottles   Culture   Final    NO GROWTH 5 DAYS Performed at [facility], [address] [place], [address] [place]    Report Status 11/06/2019 FINAL  Final  Culture, blood (routine x 2)     Status: None   Collection Time: 11/01/19  7:15 PM   Specimen: BLOOD  Result Value Ref Range Status    Specimen Description BLOOD LEFT ANTECUBITAL  Final   Special Requests   Final    BOTTLES DRAWN AEROBIC AND ANAEROBIC Blood Culture results may not be optimal due to an excessive volume of blood received in culture bottles   Culture   Final    NO GROWTH 5 DAYS Performed at [facility], [address] [place], [address] [place]    Report Status 11/06/2019 FINAL  Final  Respiratory Panel by RT PCR (Flu A&B, Covid) - Nasopharyngeal Swab     Status: None   Collection Time: 11/01/19  7:15 PM   Specimen: Nasopharyngeal  Swab  Result Value Ref Range Status   SARS Coronavirus 2 by RT PCR NEGATIVE NEGATIVE Final    Comment: (NOTE) SARS-CoV-2 target nucleic acids are NOT DETECTED. The SARS-CoV-2 RNA is generally detectable in upper respiratoy specimens during the acute phase of infection. The lowest concentration of SARS-CoV-2 viral copies this assay can detect is 131 copies/mL. A negative result does not preclude SARS-Cov-2 infection and should not be used as the sole basis for treatment or other patient management decisions. A negative result may occur with  improper specimen collection/handling, submission of specimen other than nasopharyngeal swab, presence of viral mutation(s) within the areas targeted by this assay, and inadequate number of viral copies (<131 copies/mL). A negative result must be combined with clinical observations, patient history, and epidemiological information. The expected result is Negative. Fact Sheet for Patients:  https://www.moore.com/ Fact Sheet for Healthcare Providers:  https://www.young.biz/ This test is not yet ap proved or cleared by the [place] FDA and  has been authorized for detection and/or diagnosis of SARS-CoV-2 by FDA under an Emergency Use Authorization (EUA). This EUA will remain  in effect (meaning this test can be used) for the duration of the COVID-19 declaration under Section 564(b)(1)  of the Act, 21 U.S.C. section 360bbb-3(b)(1), unless the authorization is terminated or revoked sooner.    Influenza A by PCR NEGATIVE NEGATIVE Final   Influenza B by PCR NEGATIVE NEGATIVE Final    Comment: (NOTE) The Xpert Xpress SARS-CoV-2/FLU/RSV assay is intended as an aid in  the diagnosis of influenza from Nasopharyngeal swab specimens and  should not be used as a sole basis for treatment. Nasal washings and  aspirates are unacceptable for Xpert Xpress SARS-CoV-2/FLU/RSV  testing. Fact Sheet for Patients: https://www.moore.com/ Fact Sheet for Healthcare Providers: https://www.young.biz/ This test is not yet approved or cleared by the [place] FDA and  has been authorized for detection and/or diagnosis of SARS-CoV-2 by  FDA under an Emergency Use Authorization (EUA). This EUA will remain  in effect (meaning this test can be used) for the duration of the  Covid-19 declaration under Section 564(b)(1) of the Act, 21  U.S.C. section 360bbb-3(b)(1), unless the authorization is  terminated or revoked. Performed at [facility], [address] [place], [place] [address]          Radiology Studies: No results found.      Scheduled Meds:  Chlorhexidine Gluconate Cloth  6 each Topical Daily   folic acid  1 mg Oral Daily   multivitamin with minerals  1 tablet Oral Daily   pantoprazole (PROTONIX) IV  40 mg Intravenous BID   [START ON 11/07/2019] thiamine  100 mg Oral Daily   Continuous Infusions:  sodium chloride Stopped (11/02/19 0959)     LOS: 5 days    Time spent: 35 minutes    Pennie Banter, DO [facility]   If 7PM-7AM, please contact night-coverage [email or web address] 11/06/2019, 8:51 AM

## 2019-11-07 LAB — IRON AND TIBC
Iron: 47 ug/dL (ref 45–182)
Saturation Ratios: 16 % — ABNORMAL LOW (ref 17.9–39.5)
TIBC: 298 ug/dL (ref 250–450)
UIBC: 251 ug/dL

## 2019-11-07 LAB — COMPREHENSIVE METABOLIC PANEL
ALT: 33 U/L (ref 0–44)
AST: 47 U/L — ABNORMAL HIGH (ref 15–41)
Albumin: 2.5 g/dL — ABNORMAL LOW (ref 3.5–5.0)
Alkaline Phosphatase: 117 U/L (ref 38–126)
Anion gap: 5 (ref 5–15)
BUN: 17 mg/dL (ref 6–20)
CO2: 28 mmol/L (ref 22–32)
Calcium: 8.5 mg/dL — ABNORMAL LOW (ref 8.9–10.3)
Chloride: 105 mmol/L (ref 98–111)
Creatinine, Ser: 0.71 mg/dL (ref 0.61–1.24)
GFR calc Af Amer: >60 mL/min (ref >60)
GFR calc non Af Amer: >60 mL/min (ref >60)
Glucose, Bld: 113 mg/dL — ABNORMAL HIGH (ref 70–99)
Potassium: 3.8 mmol/L (ref 3.5–5.1)
Sodium: 138 mmol/L (ref 135–145)
Total Bilirubin: 0.7 mg/dL (ref 0.3–1.2)
Total Protein: 6.3 g/dL — ABNORMAL LOW (ref 6.5–8.1)

## 2019-11-07 LAB — CBC
HCT: 28.9 % — ABNORMAL LOW (ref 39.0–52.0)
Hemoglobin: 9.4 g/dL — ABNORMAL LOW (ref 13.0–17.0)
MCH: 31.5 pg (ref 26.0–34.0)
MCHC: 32.5 g/dL (ref 30.0–36.0)
MCV: 97 fL (ref 80.0–100.0)
Platelets: 127 10*3/uL — ABNORMAL LOW (ref 150–400)
RBC: 2.98 MIL/uL — ABNORMAL LOW (ref 4.22–5.81)
RDW: 14.6 % (ref 11.5–15.5)
WBC: 6.9 10*3/uL (ref 4.0–10.5)
nRBC: 0 % (ref 0.0–0.2)

## 2019-11-07 LAB — RETICULOCYTES
Immature Retic Fract: 30.3 % — ABNORMAL HIGH (ref 2.3–15.9)
RBC.: 2.96 MIL/uL — ABNORMAL LOW (ref 4.22–5.81)
Retic Count, Absolute: 94.1 10*3/uL (ref 19.0–186.0)
Retic Ct Pct: 3.2 % — ABNORMAL HIGH (ref 0.4–3.1)

## 2019-11-07 LAB — FOLATE: Folate: 9.4 ng/mL (ref >5.9)

## 2019-11-07 LAB — VITAMIN B12: Vitamin B-12: 430 pg/mL (ref 180–914)

## 2019-11-07 LAB — MAGNESIUM: Magnesium: 1.9 mg/dL (ref 1.7–2.4)

## 2019-11-07 LAB — PHOSPHORUS: Phosphorus: 2.6 mg/dL (ref 2.5–4.6)

## 2019-11-07 LAB — HEMOGLOBIN A1C
Hgb A1c MFr Bld: 5.3 % (ref 4.8–5.6)
Mean Plasma Glucose: 105.41 mg/dL

## 2019-11-07 LAB — FERRITIN: Ferritin: 129 ng/mL (ref 24–336)

## 2019-11-07 MED ORDER — STERILE WATER FOR INJECTION IJ SOLN
INTRAMUSCULAR | Status: AC
  Administered 2019-11-07: 10 mL
  Filled 2019-11-07: qty 10

## 2019-11-07 MED ORDER — LORAZEPAM 2 MG/ML IJ SOLN
1.0000 mg | INTRAMUSCULAR | Status: DC | PRN
  Administered 2019-11-07 – 2019-11-09 (×10): 2 mg via INTRAVENOUS
  Administered 2019-11-09: 15:00:00 1 mg via INTRAVENOUS
  Administered 2019-11-10 (×2): 2 mg via INTRAVENOUS
  Filled 2019-11-07 (×13): qty 1

## 2019-11-07 MED ORDER — CHLORDIAZEPOXIDE HCL 25 MG PO CAPS
25.0000 mg | ORAL_CAPSULE | Freq: Three times a day (TID) | ORAL | Status: DC
  Administered 2019-11-07 – 2019-11-10 (×9): 25 mg via ORAL
  Filled 2019-11-07 (×9): qty 1

## 2019-11-07 MED ORDER — GABAPENTIN 600 MG PO TABS
600.0000 mg | ORAL_TABLET | Freq: Three times a day (TID) | ORAL | Status: DC
  Administered 2019-11-07 – 2019-11-08 (×4): 600 mg via ORAL
  Filled 2019-11-07 (×4): qty 1

## 2019-11-07 MED ORDER — LORAZEPAM 1 MG PO TABS: 1.0000 mg | ORAL_TABLET | ORAL | Status: DC | PRN

## 2019-11-07 MED ORDER — LORAZEPAM 2 MG/ML IJ SOLN: 1.0000 mg | INTRAMUSCULAR | Status: DC | PRN

## 2019-11-07 MED ORDER — AMLODIPINE BESYLATE 5 MG PO TABS
5.0000 mg | ORAL_TABLET | Freq: Every day | ORAL | Status: DC
  Administered 2019-11-07: 09:00:00 5 mg via ORAL
  Filled 2019-11-07: qty 1

## 2019-11-07 NOTE — Progress Notes (Signed)
PROGRESS NOTE    Terry Clark  ZWC:[id number] DOB: 1971-07-29 DOA: 11/01/2019  PCP: Patient, [name]    LOS - 6   Brief Narrative:  49 year old male with history of alcohol dependence and withdrawal seizures was admitted to ICU on 2/20 after presenting with altered mental status, severe hypothermia (85.1F), bradycardia with heart rate in the 40s, lactic acidosis and hypotension which developed during the rewarming process, required Neo-Synephrine. Patient had reportedly been found behind a convenience store sitting in the mud. Chest x-ray on admission showed a potential cavitary lesion in the left midlung zone. CT chest was obtained for further characterization, cavitary lesion was ruled out, but showed hazy opacity seen in the right upper lobe may represent atelectasis, repeat CT in 4 to 6 weeks was recommended. Hypothermia and hypotension did resolve and patient was transferred to hospitalist service as of 2/23.  Phenobarbital taper completed and patient with worsening agitation/withdrawal requiring frequent Ativan per CIWA protocol.  Patient complains of bilateral foot pain suspicious for alcohol-induced peripheral neuropathy.  Will add gabapentin for this, which may help his withdrawal as well.  Subjective 2/26: Seen and examined at bedside.  No acute events reported overnight.  Started low-dose gabapentin yesterday but patient reports no improvement in bilateral foot pain.  He continues to report tremor.  States he has pain all over but especially in his stomach and his feet.  Feet feels like pins-and-needles and are severely painful on standing or walking.  Said he could only go to the door of his room and back with PT yesterday due to the pain.  Assessment & Plan:   Principal Problem:   Alcohol withdrawal (HCC) Active Problems:   GI bleed   Acute metabolic encephalopathy   Acute anemia   Thrombocytopenia (HCC)   Hypomagnesemia   Hypokalemia   Hypophosphatemia  Hypothermia   Transaminitis   Pancytopenia - likely due to bone marrow suppression with EtOH toxicity vs dilutional from IV fluids.  Leukopenia resolved.  Platelets improving.  Anemia could be multifactorial, as below.  Acute anemia- with heme-positive stool and likely bone marrow suppression with alcohol. Patient had normal hemoglobin of 13.6 back on 10/22/2019. Upon admission, hemoglobin 11.4, has down trended to 8.8 >>8.2>>8.4 since admission without any evidence of active bleeding.However, FOBT positive on 2/23.  Highly suspect alcoholic gastritis. --GI consulted, recommend outpatient evaluation in future --follow up anemia panel --monitor CBC daily --transfuse if Hbg < 7.0  Acute metabolic encephalopathy secondary to EtOH intoxication Alcohol Withdrawal Syndrome - very slow to improve. Alcohol level 467 on admission. Patient with history of withdrawal seizures and alcohol dependence. UDS negative.  Required Precedex.  Completed phenobarbital taper.  Completed 5 days high dose IV thiamine.  He expresses desire to avoid alcohol, but admits everyone in his social network also drink, which will be a major challenge. --Monitor byCIWAprotocol for signs of withdrawal --decrease CIWA frequency to q4h --Librium 25 mg PO TID, started this afternoon given patient still quite symptomatic --gabapentin as below for neuropathy should also help --Continue thiamine 100 mg p.o. 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  No diagnosis of diabetes. --check A1C with AM labs --increase gabapentin to 600 mg TID (may also help withdrawal)  Generalized Weakness - secondary to above and being in bed now several days. --PT evaluation --up in chair as much as tolerated  Hypothermia secondary to cold exposure-resolved Sepsis work-up was negative,blood cultures still pending.Has been on empiric antibiotics, currently Unasyn. Patient is without respiratory symptoms, afebrile and without leukocytosis and had normal procalcitonin. --Will stop antibiotics and monitor clinically for signs of infection --Follow-up cultures(NGTD x 4 days)  Possible pulmonary cavitary lesion,seen on initial chest x-ray.Ruled out on CT chest. --Repeat CT chest in 4 to 6 weeks recommended per radiologist.  Mild transaminitis-likely secondary to alcohol abuse.  Improving. --Monitor LFTs --Continue Protonix for GI prophylaxis  Thrombocytopenia-improving.  Possibly dilutional as hemoglobin and white counts also trended downward. However concern for liver disease given chronic alcohol abuse. --Hold pharmacologic VTE prophylaxis --SCDs --CBCs daily to monitor  F / E / N: Hypokalemia/hypophosphatemia/hypomagnesemia-all likely due to alcohol abuse. --Pharmacy consulted for electrolyte replacement    DVT prophylaxis:SCDs Code Status: Full Code Family Communication:None at bedside during encounter  Disposition Plan:To be determined,expect discharge to prior home environment pending further improvement.   Coming Fromhome Exp DC Date:To be determined Barriers:Current medical condition, ongoing EtOH withdrawal syndrome  Medically Stable for Discharge?No  Consultants:  Gastroenterology  Pharmacy  Transitions of care  Procedures:  None  Antimicrobials:  Rocephin-2/20>>2/22  Unasyn-2/22>>2/23   Objective: Vitals:   11/07/19 0300 11/07/19 0400 11/07/19 0500 11/07/19 0600  BP: (!) 157/101 (!) 147/98 (!) 149/98 (!) 144/101  Pulse: 97 97 93 87  Resp: 13 14 13 13   Temp:      TempSrc:       SpO2: 98% 99% 98% 100%  Weight:      Height:        Intake/Output Summary (Last 24 hours) at 11/07/2019 0837 Last data filed at 11/07/2019 0500 Gross per 24 hour  Intake 0 ml  Output 1000 ml  Net -1000 ml   Filed Weights   11/03/19 0430 11/05/19 0500 11/07/19 0200  Weight: 63.7 kg 65.7 kg 66.6 kg    Examination:  General exam: awake, alert, no acute distress HEENT: atraumatic, clear conjunctiva, moist mucus membranes, hearing grossly normal  Respiratory system: CTAB, no wheezes, rales or rhonchi, normal respiratory effort. Cardiovascular system: normal S1/S2, RRR, no JVD, murmurs, rubs, gallops, no pedal edema.   Gastrointestinal system: soft, tender diffusely without rebound or guarding, ND, no HSM felt, +bowel sounds. Central nervous system: A&O x3. no gross focal neurologic deficits, normal speech Extremities: moves all, no edema, normal tone Skin: dry, intact, normal temperature Psychiatry: normal mood, congruent affect   Data Reviewed: I have personally reviewed following labs and imaging studies  CBC: Recent Labs  Lab 11/01/19 1906 11/02/19 0511 11/03/19 0435 11/04/19 0428 11/05/19 0453 11/06/19 0320 11/07/19 0501  WBC 5.7   < > 3.3* 3.7* 4.5 5.7 6.9  NEUTROABS 4.3  --   --   --  3.0  --   --   HGB 11.4*   < > 8.2* 8.4* 8.7* 9.4* 9.4*  HCT 34.1*   < > 24.7* 25.3* 26.5* 28.4* 28.9*  MCV 93.4   < > 94.3 94.1 95.0 95.0 97.0  PLT 189   < > 104* 90* 83* 100* 127*   < > = values in this interval not displayed.   Basic Metabolic Panel: Recent Labs  Lab 11/02/19 0511 11/02/19 0511 11/03/19 0435 11/04/19 0428 11/05/19 0453 11/06/19 0320 11/07/19 0501  NA 141   < > 137 132* 135 134* 138  K 3.6   < >  3.0* 3.3* 3.4* 3.6 3.8  CL 108   < > 102 98 100 99 105  CO2 21*   < > 27 29 29 27 28   GLUCOSE 76   < > 109* 141* 100* 154* 113*  BUN 11   < > 10 6 8 14 17   CREATININE 0.66   < > 0.57* 0.52* 0.53* 0.73 0.71  CALCIUM 7.0*   < > 7.5* 7.7* 8.1* 8.2* 8.5*  MG 1.6*   --   --  1.7 1.8 1.8 1.9  PHOS  --   --  2.1* 2.2* 3.0 3.3 2.6   < > = values in this interval not displayed.   GFR: Estimated Creatinine Clearance: 105.6 mL/min (by C-G formula based on SCr of 0.71 mg/dL). Liver Function Tests: Recent Labs  Lab 11/01/19 1906 11/02/19 0511 11/05/19 0453 11/06/19 0320 11/07/19 0501  AST 160* 145* 79* 61* 47*  ALT 61* 50* 40 36 33  ALKPHOS 159* 119 125 128* 117  BILITOT 1.1 1.0 0.8 0.7 0.7  PROT 7.5 5.6* 5.7* 6.0* 6.3*  ALBUMIN 3.2* 2.5* 2.4* 2.6* 2.5*   Recent Labs  Lab 11/01/19 1906  LIPASE 64*   Recent Labs  Lab 11/01/19 1915  AMMONIA 12   Coagulation Profile: No results for input(s): INR, PROTIME in the last 168 hours. Cardiac Enzymes: Recent Labs  Lab 11/01/19 1906  CKTOTAL 248   BNP (last 3 results) No results for input(s): PROBNP in the last 8760 hours. HbA1C: No results for input(s): HGBA1C in the last 72 hours. CBG: Recent Labs  Lab 11/01/19 2238  GLUCAP 85   Lipid Profile: No results for input(s): CHOL, HDL, LDLCALC, TRIG, CHOLHDL, LDLDIRECT in the last 72 hours. Thyroid Function Tests: No results for input(s): TSH, T4TOTAL, FREET4, T3FREE, THYROIDAB in the last 72 hours. Anemia Panel: Recent Labs    11/07/19 0501  FOLATE 9.4  FERRITIN 129  TIBC 298  IRON 47  RETICCTPCT 3.2*   Sepsis Labs: Recent Labs  Lab 11/01/19 1906 11/01/19 2057 11/02/19 0110 11/02/19 0511 11/03/19 0435  PROCALCITON 0.56  --   --  0.42 0.29  LATICACIDVEN 6.0* 4.9* 3.2* 2.3*  --     Recent Results (from the past 240 hour(s))  Culture, blood (routine x 2)     Status: None   Collection Time: 11/01/19  6:55 PM   Specimen: BLOOD  Result Value Ref Range Status   Specimen Description BLOOD RIGHT ANTECUBITAL  Final   Special Requests   Final    BOTTLES DRAWN AEROBIC AND ANAEROBIC Blood Culture results may not be optimal due to an excessive volume of blood received in culture bottles   Culture   Final    NO GROWTH 5 DAYS  Performed at [facility], [address] [place], [address] [place]    Report Status 11/06/2019 FINAL  Final  Culture, blood (routine x 2)     Status: None   Collection Time: 11/01/19  7:15 PM   Specimen: BLOOD  Result Value Ref Range Status   Specimen Description BLOOD LEFT ANTECUBITAL  Final   Special Requests   Final    BOTTLES DRAWN AEROBIC AND ANAEROBIC Blood Culture results may not be optimal due to an excessive volume of blood received in culture bottles   Culture   Final    NO GROWTH 5 DAYS Performed at [facility], [address] [place], [address] [place]    Report Status 11/06/2019 FINAL  Final  Respiratory Panel  by RT PCR (Flu A&B, Covid) - Nasopharyngeal Swab     Status: None   Collection Time: 11/01/19  7:15 PM   Specimen: Nasopharyngeal Swab  Result Value Ref Range Status   SARS Coronavirus 2 by RT PCR NEGATIVE NEGATIVE Final    Comment: (NOTE) SARS-CoV-2 target nucleic acids are NOT DETECTED. The SARS-CoV-2 RNA is generally detectable in upper respiratoy specimens during the acute phase of infection. The lowest concentration of SARS-CoV-2 viral copies this assay can detect is 131 copies/mL. A negative result does not preclude SARS-Cov-2 infection and should not be used as the sole basis for treatment or other patient management decisions. A negative result may occur with  improper specimen collection/handling, submission of specimen other than nasopharyngeal swab, presence of viral mutation(s) within the areas targeted by this assay, and inadequate number of viral copies (<131 copies/mL). A negative result must be combined with clinical observations, patient history, and epidemiological information. The expected result is Negative. Fact Sheet for Patients:  https://www.moore.com/ Fact Sheet for Healthcare Providers:  https://www.young.biz/ This test is not yet ap proved or cleared by the [place]  FDA and  has been authorized for detection and/or diagnosis of SARS-CoV-2 by FDA under an Emergency Use Authorization (EUA). This EUA will remain  in effect (meaning this test can be used) for the duration of the COVID-19 declaration under Section 564(b)(1) of the Act, 21 U.S.C. section 360bbb-3(b)(1), unless the authorization is terminated or revoked sooner.    Influenza A by PCR NEGATIVE NEGATIVE Final   Influenza B by PCR NEGATIVE NEGATIVE Final    Comment: (NOTE) The Xpert Xpress SARS-CoV-2/FLU/RSV assay is intended as an aid in  the diagnosis of influenza from Nasopharyngeal swab specimens and  should not be used as a sole basis for treatment. Nasal washings and  aspirates are unacceptable for Xpert Xpress SARS-CoV-2/FLU/RSV  testing. Fact Sheet for Patients: https://www.moore.com/ Fact Sheet for Healthcare Providers: https://www.young.biz/ This test is not yet approved or cleared by the [place] FDA and  has been authorized for detection and/or diagnosis of SARS-CoV-2 by  FDA under an Emergency Use Authorization (EUA). This EUA will remain  in effect (meaning this test can be used) for the duration of the  Covid-19 declaration under Section 564(b)(1) of the Act, 21  U.S.C. section 360bbb-3(b)(1), unless the authorization is  terminated or revoked. Performed at [facility], [address] [place], [place] [address]   MRSA PCR Screening     Status: None   Collection Time: 11/06/19  9:10 PM   Specimen: Nasal Mucosa; Nasopharyngeal  Result Value Ref Range Status   MRSA by PCR NEGATIVE NEGATIVE Final    Comment:        The GeneXpert MRSA Assay (FDA approved for NASAL specimens only), is one component of a comprehensive MRSA colonization surveillance program. It is not intended to diagnose MRSA infection nor to guide or monitor treatment for MRSA infections. Performed at [facility], [address],  [place], [place] [address]          Radiology Studies: No results found.      Scheduled Meds: . amLODipine  5 mg Oral Daily  . Chlorhexidine Gluconate Cloth  6 each Topical Daily  . folic acid  1 mg Oral Daily  . gabapentin  600 mg Oral TID  . multivitamin with minerals  1 tablet Oral Daily  . pantoprazole (PROTONIX) IV  40 mg Intravenous BID  . thiamine  100 mg Oral Daily  Continuous Infusions: . sodium chloride Stopped (11/02/19 0959)     LOS: 6 days    Time spent: 35 minutes    Ezekiel Slocumb, DO [facility]   If 7PM-7AM, please contact night-coverage [email or web address] 11/07/2019, 8:37 AM

## 2019-11-07 NOTE — TOC Progression Note (Addendum)
Transition of Care [facility]) - Progression Note    Patient Details  Name: Terry Clark MRN: [id number] Date of Birth: 1971-01-13  Transition of [facility] CM/SW Contact  Margarito Liner, LCSW Phone Number: 11/07/2019, 1:33 PM  Clinical Narrative: Unable to pursue SNF since patient does not have insurance. He is agreeable to home health. Made charity referral to [facility] but they declined, stating he is not appropriate for home health. CSW left message asking for clarity on that. Patient confirmed he lives in a home without water/electricity. CSW asked if he could stay anywhere else after discharge and he said there are several people he could call around to.    5:02 pm: [facility] said he is not appropriate for home health because they cannot send staff to a house where there is no water/electricity.  Expected Discharge Plan: Home/Self Care Barriers to Discharge: Continued Medical Work up  Expected Discharge Plan and Services Expected Discharge Plan: Home/Self Care                                               Social Determinants of Health (SDOH) Interventions    Readmission Risk Interventions Readmission Risk Prevention Plan 11/03/2019 11/03/2019  Transportation Screening Complete Complete  PCP or Specialist Appt within 3-5 Days (No Data) (No Data)  Medication Review [employer]) Complete Complete  Some recent data might be hidden

## 2019-11-08 LAB — COMPREHENSIVE METABOLIC PANEL
ALT: 30 U/L (ref 0–44)
AST: 44 U/L — ABNORMAL HIGH (ref 15–41)
Albumin: 2.6 g/dL — ABNORMAL LOW (ref 3.5–5.0)
Alkaline Phosphatase: 108 U/L (ref 38–126)
Anion gap: 6 (ref 5–15)
BUN: 13 mg/dL (ref 6–20)
CO2: 28 mmol/L (ref 22–32)
Calcium: 8.5 mg/dL — ABNORMAL LOW (ref 8.9–10.3)
Chloride: 106 mmol/L (ref 98–111)
Creatinine, Ser: 0.53 mg/dL — ABNORMAL LOW (ref 0.61–1.24)
GFR calc Af Amer: >60 mL/min (ref >60)
GFR calc non Af Amer: >60 mL/min (ref >60)
Glucose, Bld: 93 mg/dL (ref 70–99)
Potassium: 3.6 mmol/L (ref 3.5–5.1)
Sodium: 140 mmol/L (ref 135–145)
Total Bilirubin: 0.7 mg/dL (ref 0.3–1.2)
Total Protein: 6.2 g/dL — ABNORMAL LOW (ref 6.5–8.1)

## 2019-11-08 LAB — CBC
HCT: 27.2 % — ABNORMAL LOW (ref 39.0–52.0)
Hemoglobin: 9 g/dL — ABNORMAL LOW (ref 13.0–17.0)
MCH: 31.9 pg (ref 26.0–34.0)
MCHC: 33.1 g/dL (ref 30.0–36.0)
MCV: 96.5 fL (ref 80.0–100.0)
Platelets: 135 10*3/uL — ABNORMAL LOW (ref 150–400)
RBC: 2.82 MIL/uL — ABNORMAL LOW (ref 4.22–5.81)
RDW: 14.8 % (ref 11.5–15.5)
WBC: 5.6 10*3/uL (ref 4.0–10.5)
nRBC: 0.5 % — ABNORMAL HIGH (ref 0.0–0.2)

## 2019-11-08 LAB — PHOSPHORUS: Phosphorus: 3.4 mg/dL (ref 2.5–4.6)

## 2019-11-08 LAB — MAGNESIUM: Magnesium: 1.7 mg/dL (ref 1.7–2.4)

## 2019-11-08 MED ORDER — GABAPENTIN 300 MG PO CAPS
300.0000 mg | ORAL_CAPSULE | Freq: Once | ORAL | Status: AC
  Administered 2019-11-08: 09:00:00 300 mg via ORAL
  Filled 2019-11-08: qty 1

## 2019-11-08 MED ORDER — POTASSIUM CHLORIDE CRYS ER 20 MEQ PO TBCR
40.0000 meq | EXTENDED_RELEASE_TABLET | Freq: Once | ORAL | Status: AC
  Administered 2019-11-08: 09:00:00 40 meq via ORAL
  Filled 2019-11-08: qty 2

## 2019-11-08 MED ORDER — CLONIDINE HCL 0.1 MG PO TABS
0.1000 mg | ORAL_TABLET | Freq: Three times a day (TID) | ORAL | Status: DC
  Administered 2019-11-08 – 2019-11-10 (×7): 0.1 mg via ORAL
  Filled 2019-11-08 (×7): qty 1

## 2019-11-08 MED ORDER — AMLODIPINE BESYLATE 10 MG PO TABS: 10.0000 mg | ORAL_TABLET | Freq: Every day | ORAL | Status: DC

## 2019-11-08 MED ORDER — AMLODIPINE BESYLATE 5 MG PO TABS
5.0000 mg | ORAL_TABLET | Freq: Every day | ORAL | Status: DC
  Administered 2019-11-08 – 2019-11-10 (×3): 5 mg via ORAL
  Filled 2019-11-08 (×3): qty 1

## 2019-11-08 MED ORDER — STERILE WATER FOR INJECTION IJ SOLN
INTRAMUSCULAR | Status: AC
  Administered 2019-11-08: 09:00:00 10 mL
  Filled 2019-11-08: qty 10

## 2019-11-08 MED ORDER — MAGNESIUM SULFATE 2 GM/50ML IV SOLN
2.0000 g | Freq: Once | INTRAVENOUS | Status: AC
  Administered 2019-11-08: 09:00:00 2 g via INTRAVENOUS
  Filled 2019-11-08: qty 50

## 2019-11-08 MED ORDER — GABAPENTIN 300 MG PO CAPS
900.0000 mg | ORAL_CAPSULE | Freq: Three times a day (TID) | ORAL | Status: DC
  Administered 2019-11-08 – 2019-11-10 (×6): 900 mg via ORAL
  Filled 2019-11-08 (×6): qty 3

## 2019-11-08 NOTE — Progress Notes (Signed)
PROGRESS NOTE    Terry Clark  QIH:[id number] DOB: 04-26-1971 DOA: 11/01/2019  PCP: Patient, [name]    LOS - 7   Brief Narrative:  49 year old male with history of alcohol dependence and withdrawal seizures was admitted to ICU on 2/20 after presenting with altered mental status, severe hypothermia (85.5F), bradycardia with heart rate in the 40s, lactic acidosis and hypotension which developed during the rewarming process, required Neo-Synephrine. Patient had reportedly been found behind a convenience store sitting in the mud. Chest x-ray on admission showed a potential cavitary lesion in the left midlung zone. CT chest was obtained for further characterization, cavitary lesion was ruled out, but showed hazy opacity seen in the right upper lobe may represent atelectasis, repeat CT in 4 to 6 weeks was recommended. Hypothermia and hypotension did resolve and patient was transferred to hospitalist service as of 2/23.  Phenobarbital taper completed and patient with worsening agitation/withdrawal requiring frequent Ativan per CIWA protocol.Patient complains of bilateral foot pain suspicious for alcohol-induced peripheral neuropathy. Will add gabapentin for this, which may help his withdrawal as well.  Patient has had elevated blood pressures throughout admission as well, possibly withdrawal vs chronic untreated hypertension.  Now on amlodipine and clonidine.  Will not plan to d/c on clonidine to avoid rebound hypertension in this patient, as not clear he will be able/willing to continue taking meds regularly.   Subjective 2/27: Patient seen this AM.  No acute events reported.  He reports only 1/2% improvement in the pain in his feet with gabapentin so far.  Still feels weak.  Is able to eat but very slowly or he gets nauseous and abdominal pain worsens.    Assessment & Plan:   Principal Problem:   Alcohol withdrawal (HCC) Active Problems:   GI bleed   Acute metabolic  encephalopathy   Acute anemia   Thrombocytopenia (HCC)   Hypomagnesemia   Hypokalemia   Hypophosphatemia   Hypothermia   Transaminitis   Pancytopenia- likely due tobone marrow suppressionwithEtOH toxicity vs dilutional from IV fluids. Leukopenia resolved. Platelets improving. Anemia could be multifactorial, as below.  Acute anemia-with heme-positive stool and likely bone marrow suppression with alcohol. Patient had normal hemoglobin of 13.6 back on 10/22/2019. Upon admission, hemoglobin 11.4, has down trended to 8.8 >>8.2>>8.4 since admission without any evidence of active bleeding.However, FOBT positive on 2/23.  Highly suspect alcoholic gastritis.  Hbg improving and no transfusions were required. --GI consulted, recommend outpatient evaluation in future --follow up anemia panel --monitor CBC daily --transfuse if Hbg < 7.0  Acute metabolic encephalopathy secondary to EtOH intoxication Alcohol Withdrawal Syndrome- very slow to improve. Alcohol level 467 on admission. Patient with history of withdrawal seizures and alcohol dependence. UDS negative.Required Precedex. Completed phenobarbital taper.  Completed 5 days high dose IV thiamine. He expresses desire to avoid alcohol, but admits everyone in his social network also drink, which will be a major challenge. --Monitor [id number] protocol for signs of withdrawal --Librium 25 mg PO TID, decrease to BID tomorrow --gabapentin as below for neuropathy should also help --added clonidine for better BP control as below --Continue thiamine 100 mg p.o. daily --can d/c neurochecks  --Monitorfor seizures --provide information on local resources for maintaining sobriety on discharge (AA, etc.) --patient counseled extensively on adverse effects of alcohol abuse  Hypertension - unknown if chronic vs due to withdrawal.  Improved some on 5mg  amlodipine. --continue amlodipine 5mg  daily --start clonidine 0.1 mg PO TID (for BP  and withdrawal), but will avoid on  discharge as patient likely will not be compliant and high risk of rebound hypertension.  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 No diagnosis of diabetes. --check A1C with AM labs --increase gabapentin to 900 mg TID (may also help withdrawal)  Generalized Weakness - secondary to above and being in bed now several days. --PT evaluation --up in chair as much as tolerated  Hypothermia secondary to cold exposure-resolved Sepsis work-up was negative,blood cultures still pending.Has been on empiric antibiotics, currently Unasyn. Patient is without respiratory symptoms, afebrile and without leukocytosis and had normal procalcitonin. --Will stop antibiotics and monitor clinically for signs of infection --Follow-up cultures(NGTD x 4 days)  Possible pulmonary cavitary lesion,seen on initial chest x-ray.Ruled out on CT chest. --Repeat CT chest in 4 to 6 weeks recommended per radiologist.  Mild transaminitis-likely secondary to alcohol abuse. Improving. --Monitor LFTs --Continue Protonix for GI prophylaxis  Thrombocytopenia-improving.  Possibly dilutional as hemoglobin and white counts also trended downward. However concern for liver disease given chronic alcohol abuse. --Hold pharmacologic VTE prophylaxis --SCDs --CBCs daily to monitor  F / E / N: Hypokalemia/hypophosphatemia/hypomagnesemia-all likely due to alcohol abuse. --Pharmacy consulted for electrolyte replacement    DVT prophylaxis:SCDs (anemia, thrombocytopenia, occult+ stool) Code Status: Full Code  Family Communication:Spoke with patient's mother, Murrel Bertram, this afternoon.  She lives in [place].  She described many difficulties with patient's alcohol and substance abuse since he was 49 years old.  She has paid for rehab several times, but he  relapses quickly.  She confirms patient lives in place without water or electricity and has no place else he could stay, says he has "burned all his bridges".  She does request, if we are able, to assist patient in applying for Medicaid.  She indicates she will pay for any of his prescriptions at time of discharge, as patient will not be able to afford.      Disposition Plan:To be determined,expect discharge to prior home environment pending further improvement.  Coming Fromhome Exp DC Date:To be determined Barriers:Current medical condition, ongoing EtOH withdrawal syndrome Medically Stable for Discharge?No  Consultants:  Gastroenterology  Pharmacy  Transitions of care  Procedures:  None  Antimicrobials:  Rocephin-2/20>>2/22  Unasyn-2/22>>2/23      Objective: Vitals:   11/08/19 0800 11/08/19 0855 11/08/19 0900 11/08/19 1000  BP: (!) 158/99  (!) 150/101 135/83  Pulse: 86 88 88 81  Resp: 11 11 10 12   Temp:  97.7 F (36.5 C)    TempSrc:  Oral    SpO2: 98% 98% 99% 99%  Weight:      Height:        Intake/Output Summary (Last 24 hours) at 11/08/2019 1350 Last data filed at 11/08/2019 1308 Gross per 24 hour  Intake 340 ml  Output 1145 ml  Net -805 ml   Filed Weights   11/05/19 0500 11/07/19 0200 11/08/19 0500  Weight: 65.7 kg 66.6 kg 64.2 kg    Examination:  General exam: awake, alert, no acute distress Respiratory system: CTAB, no wheezes, rales or rhonchi, normal respiratory effort Cardiovascular system: normal S1/S2, RRR, no JVD, murmurs, rubs, gallops, no pedal edema  Gastrointestinal system: soft, NT, ND, +bowel sounds Central nervous system: A&O x3. no gross focal neurologic deficits, normal speech Extremities: moves all, no edema, normal tone Skin: dry, intact, normal temperature, normal color Psychiatry: normal mood, congruent affect    Data Reviewed: I have personally reviewed following labs and  imaging  studies  CBC: Recent Labs  Lab 11/01/19 1906 11/02/19 0511 11/04/19 0428 11/05/19 0453 11/06/19 0320 11/07/19 0501 11/08/19 0429  WBC 5.7   < > 3.7* 4.5 5.7 6.9 5.6  NEUTROABS 4.3  --   --  3.0  --   --   --   HGB 11.4*   < > 8.4* 8.7* 9.4* 9.4* 9.0*  HCT 34.1*   < > 25.3* 26.5* 28.4* 28.9* 27.2*  MCV 93.4   < > 94.1 95.0 95.0 97.0 96.5  PLT 189   < > 90* 83* 100* 127* 135*   < > = values in this interval not displayed.   Basic Metabolic Panel: Recent Labs  Lab 11/04/19 0428 11/05/19 0453 11/06/19 0320 11/07/19 0501 11/08/19 0429  NA 132* 135 134* 138 140  K 3.3* 3.4* 3.6 3.8 3.6  CL 98 100 99 105 106  CO2 29 29 27 28 28   GLUCOSE 141* 100* 154* 113* 93  BUN 6 8 14 17 13   CREATININE 0.52* 0.53* 0.73 0.71 0.53*  CALCIUM 7.7* 8.1* 8.2* 8.5* 8.5*  MG 1.7 1.8 1.8 1.9 1.7  PHOS 2.2* 3.0 3.3 2.6 3.4   GFR: Estimated Creatinine Clearance: 102.5 mL/min (A) (by C-G formula based on SCr of 0.53 mg/dL (L)). Liver Function Tests: Recent Labs  Lab 11/02/19 0511 11/05/19 0453 11/06/19 0320 11/07/19 0501 11/08/19 0429  AST 145* 79* 61* 47* 44*  ALT 50* 40 36 33 30  ALKPHOS 119 125 128* 117 108  BILITOT 1.0 0.8 0.7 0.7 0.7  PROT 5.6* 5.7* 6.0* 6.3* 6.2*  ALBUMIN 2.5* 2.4* 2.6* 2.5* 2.6*   Recent Labs  Lab 11/01/19 1906  LIPASE 64*   Recent Labs  Lab 11/01/19 1915  AMMONIA 12   Coagulation Profile: No results for input(s): INR, PROTIME in the last 168 hours. Cardiac Enzymes: Recent Labs  Lab 11/01/19 1906  CKTOTAL 248   BNP (last 3 results) No results for input(s): PROBNP in the last 8760 hours. HbA1C: Recent Labs    11/07/19 0501  HGBA1C 5.3   CBG: Recent Labs  Lab 11/01/19 2238  GLUCAP 85   Lipid Profile: No results for input(s): CHOL, HDL, LDLCALC, TRIG, CHOLHDL, LDLDIRECT in the last 72 hours. Thyroid Function Tests: No results for input(s): TSH, T4TOTAL, FREET4, T3FREE, THYROIDAB in the last 72 hours. Anemia Panel: Recent Labs     11/07/19 0501  VITAMINB12 430  FOLATE 9.4  FERRITIN 129  TIBC 298  IRON 47  RETICCTPCT 3.2*   Sepsis Labs: Recent Labs  Lab 11/01/19 1906 11/01/19 2057 11/02/19 0110 11/02/19 0511 11/03/19 0435  PROCALCITON 0.56  --   --  0.42 0.29  LATICACIDVEN 6.0* 4.9* 3.2* 2.3*  --     Recent Results (from the past 240 hour(s))  Culture, blood (routine x 2)     Status: None   Collection Time: 11/01/19  6:55 PM   Specimen: BLOOD  Result Value Ref Range Status   Specimen Description BLOOD RIGHT ANTECUBITAL  Final   Special Requests   Final    BOTTLES DRAWN AEROBIC AND ANAEROBIC Blood Culture results may not be optimal due to an excessive volume of blood received in culture bottles   Culture   Final    NO GROWTH 5 DAYS Performed at [facility], [address] [place], [address] [place]    Report Status 11/06/2019 FINAL  Final  Culture, blood (routine x 2)     Status: None   Collection  Time: 11/01/19  7:15 PM   Specimen: BLOOD  Result Value Ref Range Status   Specimen Description BLOOD LEFT ANTECUBITAL  Final   Special Requests   Final    BOTTLES DRAWN AEROBIC AND ANAEROBIC Blood Culture results may not be optimal due to an excessive volume of blood received in culture bottles   Culture   Final    NO GROWTH 5 DAYS Performed at [facility], [address] [place], [place] [address]    Report Status 11/06/2019 FINAL  Final  Respiratory Panel by RT PCR (Flu A&B, Covid) - Nasopharyngeal Swab     Status: None   Collection Time: 11/01/19  7:15 PM   Specimen: Nasopharyngeal Swab  Result Value Ref Range Status   SARS Coronavirus 2 by RT PCR NEGATIVE NEGATIVE Final    Comment: (NOTE) SARS-CoV-2 target nucleic acids are NOT DETECTED. The SARS-CoV-2 RNA is generally detectable in upper respiratoy specimens during the acute phase of infection. The lowest concentration of SARS-CoV-2 viral copies this assay can detect is 131 copies/mL. A negative result does not  preclude SARS-Cov-2 infection and should not be used as the sole basis for treatment or other patient management decisions. A negative result may occur with  improper specimen collection/handling, submission of specimen other than nasopharyngeal swab, presence of viral mutation(s) within the areas targeted by this assay, and inadequate number of viral copies (<131 copies/mL). A negative result must be combined with clinical observations, patient history, and epidemiological information. The expected result is Negative. Fact Sheet for Patients:  https://www.moore.com/ Fact Sheet for Healthcare Providers:  https://www.young.biz/ This test is not yet ap proved or cleared by the [place] FDA and  has been authorized for detection and/or diagnosis of SARS-CoV-2 by FDA under an Emergency Use Authorization (EUA). This EUA will remain  in effect (meaning this test can be used) for the duration of the COVID-19 declaration under Section 564(b)(1) of the Act, 21 U.S.C. section 360bbb-3(b)(1), unless the authorization is terminated or revoked sooner.    Influenza A by PCR NEGATIVE NEGATIVE Final   Influenza B by PCR NEGATIVE NEGATIVE Final    Comment: (NOTE) The Xpert Xpress SARS-CoV-2/FLU/RSV assay is intended as an aid in  the diagnosis of influenza from Nasopharyngeal swab specimens and  should not be used as a sole basis for treatment. Nasal washings and  aspirates are unacceptable for Xpert Xpress SARS-CoV-2/FLU/RSV  testing. Fact Sheet for Patients: https://www.moore.com/ Fact Sheet for Healthcare Providers: https://www.young.biz/ This test is not yet approved or cleared by the [place] FDA and  has been authorized for detection and/or diagnosis of SARS-CoV-2 by  FDA under an Emergency Use Authorization (EUA). This EUA will remain  in effect (meaning this test can be used) for the duration of the   Covid-19 declaration under Section 564(b)(1) of the Act, 21  U.S.C. section 360bbb-3(b)(1), unless the authorization is  terminated or revoked. Performed at [facility], [address] [place], [place] [address]   MRSA PCR Screening     Status: None   Collection Time: 11/06/19  9:10 PM   Specimen: Nasal Mucosa; Nasopharyngeal  Result Value Ref Range Status   MRSA by PCR NEGATIVE NEGATIVE Final    Comment:        The GeneXpert MRSA Assay (FDA approved for NASAL specimens only), is one component of a comprehensive MRSA colonization surveillance program. It is not intended to diagnose MRSA infection nor to guide or monitor treatment for MRSA infections. Performed at  [facility], [address], [place], [place] [address]          Radiology Studies: No results found.      Scheduled Meds: . amLODipine  5 mg Oral Daily  . chlordiazePOXIDE  25 mg Oral TID  . Chlorhexidine Gluconate Cloth  6 each Topical Daily  . cloNIDine  0.1 mg Oral TID  . folic acid  1 mg Oral Daily  . gabapentin  900 mg Oral TID  . multivitamin with minerals  1 tablet Oral Daily  . pantoprazole (PROTONIX) IV  40 mg Intravenous BID  . thiamine  100 mg Oral Daily   Continuous Infusions: . sodium chloride Stopped (11/02/19 0959)     LOS: 7 days    Time spent: 45 minutes    Pennie Banter, DO [facility]   If 7PM-7AM, please contact night-coverage [email or web address] 11/08/2019, 1:50 PM

## 2019-11-08 NOTE — Consult Note (Signed)
PHARMACY CONSULT NOTE  Pharmacy Consult for Electrolyte Monitoring and Replacement   Recent Labs: Potassium (mmol/L)  Date Value  11/08/2019 3.6   Magnesium (mg/dL)  Date Value  66/59/9357 1.7   Calcium (mg/dL)  Date Value  01/77/9390 8.5 (L)   Albumin (g/dL)  Date Value  30/05/2329 2.6 (L)   Phosphorus (mg/dL)  Date Value  07/62/2633 3.4   Sodium (mmol/L)  Date Value  11/08/2019 140     Assessment: Terry Clark is a 50 y.o. male admitted on 11/01/2019 with hypothermia and alcohol intoxication. Patient continues to require lorazepam coverage secondary to elevated CIWA in setting of alcohol withdrawal. Pharmacy was consulted for electrolyte management.   Goal of Therapy:  Electrolytes WNL   Plan:  Will order potassium PO x 1 and magnesium 2g IV x 1.   Will continue to replace for goal potassium ~ 4 and goal magnesium ~ 2.   Pharmacy will continue to monitor and adjust per consult.   Pricilla Riffle, PharmD  11/08/2019 7:25 AM.

## 2019-11-09 ENCOUNTER — Encounter: Payer: Self-pay | Admitting: Internal Medicine

## 2019-11-09 DIAGNOSIS — R509 Fever, unspecified: Secondary | ICD-10-CM | POA: Diagnosis not present

## 2019-11-09 LAB — MAGNESIUM: Magnesium: 1.8 mg/dL (ref 1.7–2.4)

## 2019-11-09 LAB — BASIC METABOLIC PANEL
Anion gap: 6 (ref 5–15)
BUN: 14 mg/dL (ref 6–20)
CO2: 27 mmol/L (ref 22–32)
Calcium: 8.6 mg/dL — ABNORMAL LOW (ref 8.9–10.3)
Chloride: 103 mmol/L (ref 98–111)
Creatinine, Ser: 0.58 mg/dL — ABNORMAL LOW (ref 0.61–1.24)
GFR calc Af Amer: >60 mL/min (ref >60)
GFR calc non Af Amer: >60 mL/min (ref >60)
Glucose, Bld: 126 mg/dL — ABNORMAL HIGH (ref 70–99)
Potassium: 3.6 mmol/L (ref 3.5–5.1)
Sodium: 136 mmol/L (ref 135–145)

## 2019-11-09 MED ORDER — MAGNESIUM SULFATE 2 GM/50ML IV SOLN
2.0000 g | Freq: Once | INTRAVENOUS | Status: AC
  Administered 2019-11-09: 2 g via INTRAVENOUS
  Filled 2019-11-09: qty 50

## 2019-11-09 MED ORDER — POTASSIUM CHLORIDE CRYS ER 20 MEQ PO TBCR
40.0000 meq | EXTENDED_RELEASE_TABLET | Freq: Once | ORAL | Status: AC
  Administered 2019-11-09: 09:00:00 40 meq via ORAL
  Filled 2019-11-09: qty 2

## 2019-11-09 NOTE — Progress Notes (Addendum)
Progress Note    MARQUIN PATINO  OAC:[id number] DOB: 06/15/1971  DOA: 11/01/2019 PCP: Patient, [name]      Brief Narrative:    Medical records reviewed and are as summarized below:  AVERILL PONS is an 49 y.o. male 49 year old male with history of alcohol dependence and withdrawal seizures was admitted to ICU on 2/20 after presenting with altered mental status, severe hypothermia (85.48F), bradycardia with heart rate in the 40s, lactic acidosis and hypotension which developed during the rewarming process, required Neo-Synephrine. Patient had reportedly been found behind a convenience store sitting in the mud. Chest x-ray on admission showed a potential cavitary lesion in the left midlung zone. CT chest was obtained for further characterization, cavitary lesion was ruled out, but showed hazy opacity seen in the right upper lobe may represent atelectasis, repeat CT in 4 to 6 weeks was recommended. Hypothermia and hypotension did resolve and patient was transferred to hospitalist service as of 2/23.  Phenobarbital taper completed and patient with worsening agitation/withdrawal requiring frequent Ativan per CIWA protocol.Patient complained of bilateral foot pain suspicious for alcohol-induced peripheral neuropathy.  He has been started on gabapentin.  Patient has had elevated blood pressures throughout admission as well, possibly withdrawal vs chronic untreated hypertension.  Now on amlodipine and clonidine.  Will not plan to d/c on clonidine to avoid rebound hypertension in this patient, as not clear he will be able/willing to continue taking meds regularly.        Assessment/Plan:   Principal Problem:   Alcohol withdrawal (HCC) Active Problems:   GI bleed   Hypothermia   Acute metabolic encephalopathy   Acute anemia   Thrombocytopenia (HCC)   Hypomagnesemia   Hypokalemia   Hypophosphatemia   Transaminitis   Fever   Fever: Etiology is not clear.   Monitor closely.  Obtain blood cultures if fever recurs.  Pancytopenia- likely due tobone marrow suppressionwithEtOH toxicity vs dilutional from IV fluids. Leukopenia resolved. Platelets improving.  Hemoglobin has improved and is stable.  Anemia could be multifactorial, as below.  Acute anemia-with heme-positive stool and likely bone marrow suppression with alcohol. Patient had normal hemoglobin of 13.6 back on 10/22/2019. Upon admission, hemoglobin 11.4, has down trended to 8.8 >>8.2>>8.4 since admission without any evidence of active bleeding.However, FOBT positive on 2/23.Highly suspect alcoholic gastritis.  Hbg improving and no transfusions were required. --GI consulted, recommend outpatient evaluation in future --follow up anemia panel --monitor CBC daily --transfuse if Hbg < 7.0  Acute metabolic encephalopathy secondary to EtOH intoxication Alcohol Withdrawal Syndrome- very slow to improve. Alcohol level 467 on admission. Patient with history of withdrawal seizures and alcohol dependence. UDS negative.Required Precedex. Completed phenobarbital taper.Completed 5 days high dose IV thiamine.He expresses desire to avoid alcohol, but admits everyone in his social network also drink, which will be a major challenge. --Monitor [id number] protocol for signs of withdrawal --Librium 25 mg PO TID, decrease to BID tomorrow --gabapentin as below for neuropathy should also help --added clonidine for better BP control as below --Continue thiamine 100 mg p.o. daily --can d/c neurochecks  --Monitorfor seizures --provide information on local resources for maintaining sobriety on discharge (AA, etc.) --patient counseled extensively on adverse effects of alcohol abuse  Hypertension - unknown if chronic vs due to withdrawal.  Improved some on 5mg  amlodipine. --continue amlodipine 5mg  daily --start clonidine 0.1 mg PO TID (for BP and withdrawal), but will avoid on discharge as  patient likely will not be compliant and high risk  of rebound hypertension.  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 No diagnosis of diabetes. --Patient has no diabetes.  Hemoglobin A1c is 5.3. --Continue gabapentin (may also help withdrawal)  Generalized Weakness - secondary to above and being in bed now several days. --PT evaluation --up in chair as much as tolerated  Hypothermia secondary to cold exposure-resolved Sepsis work-up was negative. Patient has been on Unasyn but this was stopped on 11/04/2019.  Continue to monitor off of antibiotics.   No growth on blood cultures.  Possible pulmonary cavitary lesion,seen on initial chest x-ray.Ruled out on CT chest. --Repeat CT chest in 4 to 6 weeks recommended per radiologist.  Mild transaminitis-likely secondary to alcohol abuse. Improving. --Monitor LFTs --Continue Protonix for GI prophylaxis   Hypokalemia/hypophosphatemia/hypomagnesemia-improved. --Pharmacy consulted for electrolyte replacement   Body mass index is 22.48 kg/m.   Family Communication/Anticipated D/C date and plan/Code Status   DVT prophylaxis: SCDs Code Status: Full code Family Communication: Plan discussed with patient Disposition Plan: Possible discharge in 1 to 2 days.  Patient is from home plan is to discharge him home.  He may need some assistance at home but this has not been figured out yet since home health set they cannot go to his house because there is no running water or electricity.      Subjective:   C/o pain and numbness in bilateral feet.  He also complains of pain on the left side.  He did not complain of fever although his temperature was 101.3 earlier this morning.  Objective:    Vitals:   11/08/19 1617 11/09/19 0500 11/09/19 0819 11/09/19 1247  BP: 122/80  123/86   Pulse: 85  (!) 107   Resp: 19   18   Temp: 98.6 F (37 C)  (!) 101.3 F (38.5 C) 99 F (37.2 C)  TempSrc: Oral  Oral Oral  SpO2: 100%  99%   Weight:  65.1 kg    Height:        Intake/Output Summary (Last 24 hours) at 11/09/2019 1317 Last data filed at 11/09/2019 1223 Gross per 24 hour  Intake 1820.59 ml  Output 1750 ml  Net 70.59 ml   Filed Weights   11/07/19 0200 11/08/19 0500 11/09/19 0500  Weight: 66.6 kg 64.2 kg 65.1 kg    Exam:  GEN: NAD SKIN: No rash EYES: EOMI ENT: MMM CV: RRR PULM: CTA B ABD: soft, ND, NT, +BS CNS: AAO x 3, non focal EXT: No edema or tenderness    Data Reviewed:   I have personally reviewed following labs and imaging studies:  Labs: Labs show the following:   Basic Metabolic Panel: Recent Labs  Lab 11/04/19 0428 11/04/19 0428 11/05/19 0453 11/05/19 0453 11/06/19 0320 11/06/19 0320 11/07/19 0501 11/07/19 0501 11/08/19 0429 11/09/19 0524  NA 132*   < > 135  --  134*  --  138  --  140 136  K 3.3*   < > 3.4*   < > 3.6   < > 3.8   < > 3.6 3.6  CL 98   < > 100  --  99  --  105  --  106 103  CO2 29   < > 29  --  27  --  28  --  28 27  GLUCOSE 141*   < > 100*  --  154*  --  113*  --  93 126*  BUN  6   < > 8  --  14  --  17  --  13 14  CREATININE 0.52*   < > 0.53*  --  0.73  --  0.71  --  0.53* 0.58*  CALCIUM 7.7*   < > 8.1*  --  8.2*  --  8.5*  --  8.5* 8.6*  MG 1.7   < > 1.8  --  1.8  --  1.9  --  1.7 1.8  PHOS 2.2*  --  3.0  --  3.3  --  2.6  --  3.4  --    < > = values in this interval not displayed.   GFR Estimated Creatinine Clearance: 104 mL/min (A) (by C-G formula based on SCr of 0.58 mg/dL (L)). Liver Function Tests: Recent Labs  Lab 11/05/19 0453 11/06/19 0320 11/07/19 0501 11/08/19 0429  AST 79* 61* 47* 44*  ALT 40 36 33 30  ALKPHOS 125 128* 117 108  BILITOT 0.8 0.7 0.7 0.7  PROT 5.7* 6.0* 6.3* 6.2*  ALBUMIN 2.4* 2.6* 2.5* 2.6*   No results for input(s): LIPASE, AMYLASE in the last 168 hours. No results for input(s): AMMONIA in the last 168  hours. Coagulation profile No results for input(s): INR, PROTIME in the last 168 hours.  CBC: Recent Labs  Lab 11/04/19 0428 11/05/19 0453 11/06/19 0320 11/07/19 0501 11/08/19 0429  WBC 3.7* 4.5 5.7 6.9 5.6  NEUTROABS  --  3.0  --   --   --   HGB 8.4* 8.7* 9.4* 9.4* 9.0*  HCT 25.3* 26.5* 28.4* 28.9* 27.2*  MCV 94.1 95.0 95.0 97.0 96.5  PLT 90* 83* 100* 127* 135*   Cardiac Enzymes: No results for input(s): CKTOTAL, CKMB, CKMBINDEX, TROPONINI in the last 168 hours. BNP (last 3 results) No results for input(s): PROBNP in the last 8760 hours. CBG: No results for input(s): GLUCAP in the last 168 hours. D-Dimer: No results for input(s): DDIMER in the last 72 hours. Hgb A1c: Recent Labs    11/07/19 0501  HGBA1C 5.3   Lipid Profile: No results for input(s): CHOL, HDL, LDLCALC, TRIG, CHOLHDL, LDLDIRECT in the last 72 hours. Thyroid function studies: No results for input(s): TSH, T4TOTAL, T3FREE, THYROIDAB in the last 72 hours.  Invalid input(s): FREET3 Anemia work up: Recent Labs    11/07/19 0501  VITAMINB12 430  FOLATE 9.4  FERRITIN 129  TIBC 298  IRON 47  RETICCTPCT 3.2*   Sepsis Labs: Recent Labs  Lab 11/03/19 0435 11/04/19 0428 11/05/19 0453 11/06/19 0320 11/07/19 0501 11/08/19 0429  PROCALCITON 0.29  --   --   --   --   --   WBC 3.3*   < > 4.5 5.7 6.9 5.6   < > = values in this interval not displayed.    Microbiology Recent Results (from the past 240 hour(s))  Culture, blood (routine x 2)     Status: None   Collection Time: 11/01/19  6:55 PM   Specimen: BLOOD  Result Value Ref Range Status   Specimen Description BLOOD RIGHT ANTECUBITAL  Final   Special Requests   Final    BOTTLES DRAWN AEROBIC AND ANAEROBIC Blood Culture results may not be optimal due to an excessive volume of blood received in culture bottles   Culture   Final    NO GROWTH 5 DAYS Performed at [facility], [address], [place], [place] [address]    Report Status  11/06/2019 FINAL  Final  Culture,  blood (routine x 2)     Status: None   Collection Time: 11/01/19  7:15 PM   Specimen: BLOOD  Result Value Ref Range Status   Specimen Description BLOOD LEFT ANTECUBITAL  Final   Special Requests   Final    BOTTLES DRAWN AEROBIC AND ANAEROBIC Blood Culture results may not be optimal due to an excessive volume of blood received in culture bottles   Culture   Final    NO GROWTH 5 DAYS Performed at [facility], [address] [place], [place] [address]    Report Status 11/06/2019 FINAL  Final  Respiratory Panel by RT PCR (Flu A&B, Covid) - Nasopharyngeal Swab     Status: None   Collection Time: 11/01/19  7:15 PM   Specimen: Nasopharyngeal Swab  Result Value Ref Range Status   SARS Coronavirus 2 by RT PCR NEGATIVE NEGATIVE Final    Comment: (NOTE) SARS-CoV-2 target nucleic acids are NOT DETECTED. The SARS-CoV-2 RNA is generally detectable in upper respiratoy specimens during the acute phase of infection. The lowest concentration of SARS-CoV-2 viral copies this assay can detect is 131 copies/mL. A negative result does not preclude SARS-Cov-2 infection and should not be used as the sole basis for treatment or other patient management decisions. A negative result may occur with  improper specimen collection/handling, submission of specimen other than nasopharyngeal swab, presence of viral mutation(s) within the areas targeted by this assay, and inadequate number of viral copies (<131 copies/mL). A negative result must be combined with clinical observations, patient history, and epidemiological information. The expected result is Negative. Fact Sheet for Patients:  PinkCheek.be Fact Sheet for Healthcare Providers:  GravelBags.it This test is not yet ap proved or cleared by the [place] FDA and  has been authorized for detection and/or diagnosis of SARS-CoV-2 by FDA under an  Emergency Use Authorization (EUA). This EUA will remain  in effect (meaning this test can be used) for the duration of the COVID-19 declaration under Section 564(b)(1) of the Act, 21 U.S.C. section 360bbb-3(b)(1), unless the authorization is terminated or revoked sooner.    Influenza A by PCR NEGATIVE NEGATIVE Final   Influenza B by PCR NEGATIVE NEGATIVE Final    Comment: (NOTE) The Xpert Xpress SARS-CoV-2/FLU/RSV assay is intended as an aid in  the diagnosis of influenza from Nasopharyngeal swab specimens and  should not be used as a sole basis for treatment. Nasal washings and  aspirates are unacceptable for Xpert Xpress SARS-CoV-2/FLU/RSV  testing. Fact Sheet for Patients: PinkCheek.be Fact Sheet for Healthcare Providers: GravelBags.it This test is not yet approved or cleared by the [place] FDA and  has been authorized for detection and/or diagnosis of SARS-CoV-2 by  FDA under an Emergency Use Authorization (EUA). This EUA will remain  in effect (meaning this test can be used) for the duration of the  Covid-19 declaration under Section 564(b)(1) of the Act, 21  U.S.C. section 360bbb-3(b)(1), unless the authorization is  terminated or revoked. Performed at [facility], [address] [place], [place] [address]   MRSA PCR Screening     Status: None   Collection Time: 11/06/19  9:10 PM   Specimen: Nasal Mucosa; Nasopharyngeal  Result Value Ref Range Status   MRSA by PCR NEGATIVE NEGATIVE Final    Comment:        The GeneXpert MRSA Assay (FDA approved for NASAL specimens only), is one component of a comprehensive MRSA colonization surveillance program. It is not intended to diagnose MRSA  infection nor to guide or monitor treatment for MRSA infections. Performed at [facility], [address] [place], [place] [address]     Procedures and diagnostic studies:  No results  found.  Medications:   . amLODipine  5 mg Oral Daily  . chlordiazePOXIDE  25 mg Oral TID  . cloNIDine  0.1 mg Oral TID  . folic acid  1 mg Oral Daily  . gabapentin  900 mg Oral TID  . multivitamin with minerals  1 tablet Oral Daily  . pantoprazole (PROTONIX) IV  40 mg Intravenous BID  . thiamine  100 mg Oral Daily   Continuous Infusions: . sodium chloride Stopped (11/09/19 1012)     LOS: 8 days   Laylia Mui  [facility]     11/09/2019, 1:17 PM

## 2019-11-09 NOTE — Consult Note (Signed)
PHARMACY CONSULT NOTE  Pharmacy Consult for Electrolyte Monitoring and Replacement   Recent Labs: Potassium (mmol/L)  Date Value  11/09/2019 3.6   Magnesium (mg/dL)  Date Value  28/24/1753 1.8   Calcium (mg/dL)  Date Value  09/14/457 8.6 (L)   Albumin (g/dL)  Date Value  13/68/5992 2.6 (L)   Phosphorus (mg/dL)  Date Value  34/14/4360 3.4   Sodium (mmol/L)  Date Value  11/09/2019 136     Assessment: Terry Clark is a 49 y.o. male admitted on 11/01/2019 with hypothermia and alcohol intoxication. Patient continues to require lorazepam coverage secondary to elevated CIWA in setting of alcohol withdrawal. Pharmacy was consulted for electrolyte management.   Goal of Therapy:  Electrolytes WNL   Plan:  Will repeat potassium PO x 1 and magnesium 2g IV x 1 again today.   Will continue to replace for goal potassium ~ 4 and goal magnesium ~ 2.   Pharmacy will continue to monitor and adjust per consult.   Pricilla Riffle, PharmD  11/09/2019 7:34 AM.

## 2019-11-09 NOTE — Progress Notes (Signed)
PT Cancellation Note  Patient Details Name: Terry Clark MRN: [id number] DOB: [birth year]   Cancelled Treatment:    Reason Eval/Treat Not Completed: Other (comment);Patient's level of consciousness. Patient in room asleep, PT turned on lights and made several verbal attempts to rouse patient. He continued to snore, no change in affect. PT will re-attempt at a later time/date.    Alva Garnet PT, DPT, CSCS    11/09/2019, 1:27 PM

## 2019-11-10 LAB — BASIC METABOLIC PANEL
Anion gap: 9 (ref 5–15)
BUN: 14 mg/dL (ref 6–20)
CO2: 24 mmol/L (ref 22–32)
Calcium: 8.6 mg/dL — ABNORMAL LOW (ref 8.9–10.3)
Chloride: 107 mmol/L (ref 98–111)
Creatinine, Ser: 0.69 mg/dL (ref 0.61–1.24)
GFR calc Af Amer: >60 mL/min (ref >60)
GFR calc non Af Amer: >60 mL/min (ref >60)
Glucose, Bld: 180 mg/dL — ABNORMAL HIGH (ref 70–99)
Potassium: 3.6 mmol/L (ref 3.5–5.1)
Sodium: 140 mmol/L (ref 135–145)

## 2019-11-10 LAB — CBC WITH DIFFERENTIAL/PLATELET
Abs Immature Granulocytes: 0.07 10*3/uL (ref 0.00–0.07)
Basophils Absolute: 0 10*3/uL (ref 0.0–0.1)
Basophils Relative: 1 %
Eosinophils Absolute: 0.1 10*3/uL (ref 0.0–0.5)
Eosinophils Relative: 2 %
HCT: 28 % — ABNORMAL LOW (ref 39.0–52.0)
Hemoglobin: 8.7 g/dL — ABNORMAL LOW (ref 13.0–17.0)
Immature Granulocytes: 1 %
Lymphocytes Relative: 17 %
Lymphs Abs: 0.9 10*3/uL (ref 0.7–4.0)
MCH: 31.6 pg (ref 26.0–34.0)
MCHC: 31.1 g/dL (ref 30.0–36.0)
MCV: 101.8 fL — ABNORMAL HIGH (ref 80.0–100.0)
Monocytes Absolute: 1.2 10*3/uL — ABNORMAL HIGH (ref 0.1–1.0)
Monocytes Relative: 22 %
Neutro Abs: 3.1 10*3/uL (ref 1.7–7.7)
Neutrophils Relative %: 57 %
Platelets: 174 10*3/uL (ref 150–400)
RBC: 2.75 MIL/uL — ABNORMAL LOW (ref 4.22–5.81)
RDW: 15.3 % (ref 11.5–15.5)
WBC: 5.4 10*3/uL (ref 4.0–10.5)
nRBC: 0 % (ref 0.0–0.2)

## 2019-11-10 MED ORDER — THIAMINE HCL 100 MG PO TABS: 100.0000 mg | ORAL_TABLET | Freq: Every day | ORAL | 3 refills | Status: AC

## 2019-11-10 MED ORDER — AMLODIPINE BESYLATE 5 MG PO TABS: 10.0000 mg | ORAL_TABLET | Freq: Every day | ORAL | 3 refills | Status: AC

## 2019-11-10 MED ORDER — PANTOPRAZOLE SODIUM 40 MG PO TBEC: 40.0000 mg | DELAYED_RELEASE_TABLET | Freq: Every day | ORAL | 3 refills | Status: AC

## 2019-11-10 MED ORDER — GABAPENTIN 300 MG PO CAPS: 900.0000 mg | ORAL_CAPSULE | Freq: Three times a day (TID) | ORAL | 3 refills | Status: AC

## 2019-11-10 MED ORDER — POTASSIUM CHLORIDE CRYS ER 20 MEQ PO TBCR
40.0000 meq | EXTENDED_RELEASE_TABLET | Freq: Once | ORAL | Status: AC
  Administered 2019-11-10: 40 meq via ORAL
  Filled 2019-11-10: qty 2

## 2019-11-10 NOTE — TOC Progression Note (Addendum)
Transition of Care [facility]) - Progression Note    Patient Details  Name: ELMO RIO MRN: [id number] Date of Birth: 04-14-1971  [facility]) CM/SW Contact  Barrie Dunker, RN Phone Number: 11/10/2019, 8:59 AM  Clinical Narrative:   Patient wants to go home and will get a ride he does not have Electricity and running water, he is aware that [facility] will not come to his n=home due to no electricity and running water and states that is fine, He will get a WC and BSC and will need to pick up the [facility] from the [employer] retail store as there is none in stock here at the hospital, Brad with [employer] is aware and the order will be placed. The patient will pick up his medications from [facility] [place], The patient was provided an [facility] and requested to call them for an appointment and fill out the application, Also to obtain medications on ongoing bases for [facility].    Expected Discharge Plan: Home/Self Care Barriers to Discharge: Continued Medical Work up  Expected Discharge Plan and Services Expected Discharge Plan: Home/Self Care                                               Social Determinants of Health (SDOH) Interventions    Readmission Risk Interventions Readmission Risk Prevention Plan 11/03/2019 11/03/2019  Transportation Screening Complete Complete  PCP or Specialist Appt within 3-5 Days (No Data) (No Data)  Medication Review Oceanographer) Complete Complete  Some recent data might be hidden

## 2019-11-10 NOTE — Progress Notes (Signed)
Discharge summary reviewed with verbal understanding. Taxi called at 1:22pm for transportation.

## 2019-11-10 NOTE — Progress Notes (Signed)
Physical Therapy Treatment Patient Details Name: Terry Clark MRN: [id number] DOB: [birth year] Today's Date: 11/10/2019    History of Present Illness Pt is a 49 y.o. male presenting to hospital 11/01/19 with hypothermia (pt found confused and sitting in cold; core temp 86.2 prior to arrival to ED).  Per chart review, pt with h/o multiple admissions d/t alcohol intoxication.  Pt admitted with hypothermia secondary cold exposure, hypotension, AMS secondary ETOH intoxication, and possible small patch PNA.  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    PT Comments    Pt agrees to session.  Bed mobility without assist.  Stood with min a x 1.  Initially declines use of walker and takes several steps with bed and counter for support.  He is given and uses walker to get to bathroom.  Slow step to gait with pan B balls of feet.  Upon return, he leaves walker to the side and uses bed and counter unsafely to get to bed despite education and cues.  Once sitting, he removed socks to look at bottom of feet.  Both feet have dry areas with yellowing of skin but no open wounds.  L toenail has fallen off.  R is loose and pt proceeds to try to remove toenail himself despite request and cues to stop.  It remains attached at the base.  Discussed with MD.   Follow Up Recommendations  SNF     Equipment Recommendations       Recommendations for Other Services       Precautions / Restrictions Precautions Precautions: Fall Restrictions Weight Bearing Restrictions: No    Mobility  Bed Mobility Overal bed mobility: Needs Assistance Bed Mobility: Supine to Sit;Sit to Supine     Supine to sit: Modified independent (Device/Increase time) Sit to supine: Modified independent (Device/Increase time)      Transfers Overall transfer level: Needs  assistance Equipment used: Rolling walker (2 wheeled) Transfers: Sit to/from Stand Sit to Stand: Min assist            Ambulation/Gait Ambulation/Gait assistance: Herbalist (Feet): 15 Feet Assistive device: Rolling walker (2 wheeled) Gait Pattern/deviations: Step-to pattern Gait velocity: decreased   General Gait Details: slow steady gait, highly dependant on RW for support due to pain balls of B feet   Stairs             Wheelchair Mobility    Modified Rankin (Stroke Patients Only)       Balance Overall balance assessment: Needs assistance Sitting-balance support: Feet supported Sitting balance-Leahy Scale: Good     Standing balance support: Bilateral upper extremity supported Standing balance-Leahy Scale: Fair                              Cognition Arousal/Alertness: Awake/alert Behavior During Therapy: WFL for tasks assessed/performed Overall Cognitive Status: Within Functional Limits for tasks assessed                                        Exercises      General Comments        Pertinent Vitals/Pain Pain Assessment: Faces Faces Pain Scale: Hurts even more Pain Location: B balls of feet, R toenail Pain Descriptors / Indicators: Constant Pain Intervention(s):  Limited activity within patient's tolerance;Monitored during session    Home Living                      Prior Function            PT Goals (current goals can now be found in the care plan section) Progress towards PT goals: Progressing toward goals    Frequency    Min 2X/week      PT Plan Current plan remains appropriate    Co-evaluation              AM-PAC PT "6 Clicks" Mobility   Outcome Measure  Help needed turning from your back to your side while in a flat bed without using bedrails?: A Little Help needed moving from lying on your back to sitting on the side of a flat bed without using bedrails?: A  Little Help needed moving to and from a bed to a chair (including a wheelchair)?: A Little Help needed standing up from a chair using your arms (e.g., wheelchair or bedside chair)?: A Little Help needed to walk in hospital room?: A Little Help needed climbing 3-5 steps with a railing? : A Lot 6 Click Score: 17    End of Session Equipment Utilized During Treatment: Gait belt Activity Tolerance: Patient tolerated treatment well;Patient limited by pain Patient left: in bed;with call bell/phone within reach;with bed alarm set         Time: 4174-0814 PT Time Calculation (min) (ACUTE ONLY): 20 min  Charges:  $Gait Training: 8-22 mins                    Danielle Dess, PTA 11/10/19, 9:42 AM '

## 2019-11-10 NOTE — Progress Notes (Cosign Needed)
Patient suffers from [place] which impairs their ability to perform daily activities like ADLs in the home.  A walking aid will not resolve issue with performing activities of daily living. A wheelchair will allow patient to safely perform daily activities. Patient is not able to propel themselves in the home using a standard weight wheelchair due to weakness. Patient can self propel in the lightweight wheelchair. Length of need 99 months. Accessories: elevating leg rests ELRs, wheel locks, extensions and anti-tippers and Back Cushion.

## 2019-11-10 NOTE — TOC Progression Note (Signed)
Transition of Care [facility]) - Progression Note    Patient Details  Name: Terry Clark MRN: [id number] Date of Birth: 13-May-1971  [facility] of [facility]) CM/SW Contact  Barrie Dunker, RN Phone Number: 11/10/2019, 12:03 PM  Clinical Narrative:     Provided the patient with a Txi Voucher he is unable to get a ride home  Expected Discharge Plan: Home/Self Care Barriers to Discharge: Barriers Resolved  Expected Discharge Plan and Services Expected Discharge Plan: Home/Self Care       Living arrangements for the past 2 months: Single Family Home Expected Discharge Date: 11/10/19               DME Arranged: 3-N-1, Wheelchair manual DME Agency: [facility] Date DME Agency Contacted: 11/10/19 Time DME Agency Contacted: [phone] Representative spoke with at DME Agency: Nida Boatman HH Arranged: NA           Social Determinants of Health (SDOH) Interventions    Readmission Risk Interventions Readmission Risk Prevention Plan 11/03/2019 11/03/2019  Transportation Screening Complete Complete  PCP or Specialist Appt within 3-5 Days (No Data) (No Data)  Medication Review Oceanographer) Complete Complete  Some recent data might be hidden

## 2019-11-10 NOTE — Consult Note (Signed)
PHARMACY CONSULT NOTE  Pharmacy Consult for Electrolyte Monitoring and Replacement   Recent Labs: Potassium (mmol/L)  Date Value  11/10/2019 3.6   Magnesium (mg/dL)  Date Value  99/71/8209 1.8   Calcium (mg/dL)  Date Value  90/68/9340 8.6 (L)   Albumin (g/dL)  Date Value  68/40/3353 2.6 (L)   Phosphorus (mg/dL)  Date Value  31/74/0992 3.4   Sodium (mmol/L)  Date Value  11/10/2019 140     Assessment: Terry Clark is a 49 y.o. male admitted on 11/01/2019 with hypothermia and alcohol intoxication. Patient continues to require lorazepam coverage secondary to elevated CIWA in setting of alcohol withdrawal. Pharmacy was consulted for electrolyte management.   Goal of Therapy:  Electrolytes WNL   Plan:  Will repeat potassium PO x 1 again today. F/u labs in AM.   Will continue to replace for goal potassium ~ 4 and goal magnesium ~ 2.   Pharmacy will continue to monitor and adjust per consult.   Marty Heck, PharmD, BCPS 11/10/2019 8:51 AM.

## 2019-11-10 NOTE — Discharge Summary (Addendum)
Physician Discharge Summary  Terry GarbeJohnathan T Clark ZOX:[id number] DOB: 07-07-1971 DOA: 11/01/2019  PCP: Patient, [name]  Admit date: 11/01/2019 Discharge date: 11/10/2019  Admitted From: Home Disposition:  Home  Recommendations for Outpatient Follow-up:  1. Follow up with PCP in 1-2 weeks 2. Please obtain BMP/CBC in one week 3. Please follow up with AA and other local resources to assist with efforts at staying sober  Home Health: No  Equipment/Devices: bedside commode, rolling walker, wheelchair   Discharge Condition: Stable  CODE STATUS: Full  Diet recommendation: Regular  Brief/Interim Summary:  Terry GarbeJohnathan T Terry Clark is an 49 y.o. male 49 year old male with history of alcohol dependence and withdrawal seizures was admitted to ICU on 2/20 after presenting with altered mental status, severe hypothermia (85.29F), bradycardia with heart rate in the 40s, lactic acidosis and hypotension which developed during the rewarming process, required Neo-Synephrine. Patient had reportedly been found behind a convenience store sitting in the mud. Chest x-ray on admission showed a potential cavitary lesion in the left midlung zone. CT chest was obtained for further characterization, cavitary lesion was ruled out, but showed hazy opacity seen in the right upper lobe may represent atelectasis, repeat CT in 4 to 6 weeks was recommended. Hypothermia and hypotension did resolve and patient was transferred to hospitalist service as of 2/23.  Phenobarbital taper completed and patient with worsening agitation/withdrawal requiring frequent Ativan per CIWA protocol.Patient complained of bilateral foot pain suspicious for alcohol-induced peripheral neuropathy.  He has been started on gabapentin.  Patient had elevated blood pressures throughout admission as well, possibly withdrawal vs chronic untreated hypertension. Discharged on 10 mg amlodipine daily.    PT recommend SNF but patient uninsured.  Unable to secure  home health services as patient lives in home without running water or electricity, so agencies will not send staff.  He was provided with necessary DME.  On day of discharge, patient able to ambulate from bed to restroom without issue.    Patient had loose right great toenail, discussed with podiatry.  Recommended outpatient follow up.   Discharge Diagnoses: Principal Problem:   Alcohol withdrawal (HCC) Active Problems:   GI bleed   Acute metabolic encephalopathy   Acute anemia   Thrombocytopenia (HCC)   Hypomagnesemia   Hypokalemia   Hypophosphatemia   Hypothermia   Transaminitis   Fever    Discharge Instructions   Discharge Instructions    Call MD for:  temperature >100.4   Complete by: As directed    Diet - low sodium heart healthy   Complete by: As directed    Discharge instructions   Complete by: As directed    Take medications as prescribed. Amlodipine is for high blood pressure. Gabapentin is for the nerve pain in your feet and legs. Pantoprazole is for your stomach irritation.  Please AVOID ALL ALCOHOL use.  Reach out to local community resources to help you in staying sober. This is THE SINGLE BEST THING YOU CAN DO FOR YOUR HEALTH AND YOUR LIFE.   Increase activity slowly   Complete by: As directed      Allergies as of 11/10/2019   No Known Allergies     Medication List    STOP taking these medications   chlordiazePOXIDE 25 MG capsule Commonly known as: LIBRIUM   diazepam 2 MG tablet Commonly known as: Valium   famotidine 20 MG tablet Commonly known as: Pepcid     TAKE these medications   amLODipine 5 MG tablet Commonly known as: NORVASC Take  2 tablets (10 mg total) by mouth daily. What changed: medication strength   folic acid 1 MG tablet Commonly known as: FOLVITE Take 1 tablet (1 mg total) by mouth daily.   gabapentin 300 MG capsule Commonly known as: NEURONTIN Take 3 capsules (900 mg total) by mouth 3 (three) times daily.    multivitamin with minerals Tabs tablet Take 1 tablet by mouth daily.   pantoprazole 40 MG tablet Commonly known as: Protonix Take 1 tablet (40 mg total) by mouth daily.   thiamine 100 MG tablet Take 1 tablet (100 mg total) by mouth daily. What changed: Another medication with the same name was added. Make sure you understand how and when to take each.   thiamine 100 MG tablet Take 1 tablet (100 mg total) by mouth daily. What changed: You were already taking a medication with the same name, and this prescription was added. Make sure you understand how and when to take each.            Durable Medical Equipment  (From admission, onward)         Start     Ordered   11/10/19 0904  For home use only DME 3 n 1  Once     11/10/19 0904   11/10/19 0903  For home use only DME standard manual wheelchair with seat cushion  Once    Comments: Patient suffers from generalized weakness and peripheral neuropathy which impairs their ability to perform daily activities like bathing, dressing, grooming and toileting in the home.  A walker will not resolve issue with performing activities of daily living. A wheelchair will allow patient to safely perform daily activities. Patient can safely propel the wheelchair in the home or has a caregiver who can provide assistance. Length of need 6 months . Accessories: elevating leg rests (ELRs), wheel locks, extensions and anti-tippers.  Also include back cushion.   11/10/19 0904          No Known Allergies  Consultations:  None    Procedures/Studies: CT CHEST W CONTRAST  Result Date: 11/02/2019 CLINICAL DATA:  Possible left lung cavitary lesion on recent chest x-ray. EXAM: CT CHEST WITH CONTRAST TECHNIQUE: Multidetector CT imaging of the chest was performed during intravenous contrast administration. IV mechanical malfunction during contrast injection is only a small amount of intravenous contrast was administered for the exam. Contrast  injection was not repeated. CONTRAST:  72mL OMNIPAQUE IOHEXOL 300 MG/ML  SOLN COMPARISON:  07/27/2018 FINDINGS: Cardiovascular: Heart is normal size. Mild calcified plaque over the 3 vessel coronary arteries. Thoracic aorta is normal in caliber. Remaining vascular structures are unremarkable. Mediastinum/Nodes: No evidence of mediastinal or hilar adenopathy. Remaining mediastinal structures are normal. Lungs/Pleura: Lungs are adequately inflated with minimal dependent bibasilar atelectasis. No evidence of nodule or cavitary process within the left lung. There is a focal somewhat linear hazy focus of airspace opacification over the periphery of the anterior right upper lobe which may be due to atelectasis or infection. No effusion. Airways are normal. Upper Abdomen: No acute findings. Musculoskeletal: Stable mild compression deformities of T5 through T7. Mild degenerative changes of the spine. IMPRESSION: 1. No evidence of cavitary lesion over the left lung. Hazy peripheral linear area of opacification over the anterior right upper lobe which may be due to atelectasis or infection. Recommend follow-up CT 4-6 weeks. 2.  Three stable mild compression deformities involving T5-T7. 3.  Atherosclerotic coronary artery disease. Electronically Signed   By: Elberta Fortis M.D.   On:  11/02/2019 11:43   DG Chest Portable 1 View  Result Date: 11/01/2019 CLINICAL DATA:  Altered mental status. EXAM: PORTABLE CHEST 1 VIEW COMPARISON:  August 20, 2018 FINDINGS: There is no pneumothorax or large pleural effusion. The heart size is normal. There is suggestion of a potentially cavitary 1.5 cm lesion in the left mid lung zone. This may represent artifact from overlapping osseous structures, however a true pulmonary lesion is difficult to entirely exclude. There is no acute osseous abnormality. IMPRESSION: Possible cavitary 1.5 cm lesion in the left mid lung zone. Consider further evaluation with chest CT, preferably with contrast.  No pneumothorax or large pleural effusion. Electronically Signed   By: Katherine Mantle M.D.   On: 11/01/2019 19:37       Subjective: Patient seen this AM. No acute events reported.  He reports ongoing foot pain, stomach pain but without nausea or vomiting.  No fever/chills, chest pain or SOB.   Discharge Exam: Vitals:   11/10/19 0607 11/10/19 0721  BP: 115/79 (!) 165/95  Pulse: 78 73  Resp:  17  Temp: 98.8 F (37.1 C) 98.7 F (37.1 C)  SpO2: 100% 99%   Vitals:   11/10/19 0226 11/10/19 0500 11/10/19 0607 11/10/19 0721  BP: 137/89  115/79 (!) 165/95  Pulse: 82  78 73  Resp:    17  Temp: 98.2 F (36.8 C)  98.8 F (37.1 C) 98.7 F (37.1 C)  TempSrc: Oral   Oral  SpO2: 99%  100% 99%  Weight:  63.2 kg    Height:        General: Pt is alert, awake, not in acute distress Cardiovascular: RRR, S1/S2 +, no rubs, no gallops Respiratory: CTA bilaterally, no wheezing, no rhonchi Abdominal: Soft, NT, ND, bowel sounds + Extremities: no edema, no cyanosis    The results of significant diagnostics from this hospitalization (including imaging, microbiology, ancillary and laboratory) are listed below for reference.     Microbiology: Recent Results (from the past 240 hour(s))  Culture, blood (routine x 2)     Status: None   Collection Time: 11/01/19  6:55 PM   Specimen: BLOOD  Result Value Ref Range Status   Specimen Description BLOOD RIGHT ANTECUBITAL  Final   Special Requests   Final    BOTTLES DRAWN AEROBIC AND ANAEROBIC Blood Culture results may not be optimal due to an excessive volume of blood received in culture bottles   Culture   Final    NO GROWTH 5 DAYS Performed at [facility], [address] [place], [place] [address]    Report Status 11/06/2019 FINAL  Final  Culture, blood (routine x 2)     Status: None   Collection Time: 11/01/19  7:15 PM   Specimen: BLOOD  Result Value Ref Range Status   Specimen Description BLOOD LEFT ANTECUBITAL  Final    Special Requests   Final    BOTTLES DRAWN AEROBIC AND ANAEROBIC Blood Culture results may not be optimal due to an excessive volume of blood received in culture bottles   Culture   Final    NO GROWTH 5 DAYS Performed at [facility], [address] [place], [place] [address]    Report Status 11/06/2019 FINAL  Final  Respiratory Panel by RT PCR (Flu A&B, Covid) - Nasopharyngeal Swab     Status: None   Collection Time: 11/01/19  7:15 PM   Specimen: Nasopharyngeal Swab  Result Value Ref Range Status   SARS Coronavirus 2 by RT  PCR NEGATIVE NEGATIVE Final    Comment: (NOTE) SARS-CoV-2 target nucleic acids are NOT DETECTED. The SARS-CoV-2 RNA is generally detectable in upper respiratoy specimens during the acute phase of infection. The lowest concentration of SARS-CoV-2 viral copies this assay can detect is 131 copies/mL. A negative result does not preclude SARS-Cov-2 infection and should not be used as the sole basis for treatment or other patient management decisions. A negative result may occur with  improper specimen collection/handling, submission of specimen other than nasopharyngeal swab, presence of viral mutation(s) within the areas targeted by this assay, and inadequate number of viral copies (<131 copies/mL). A negative result must be combined with clinical observations, patient history, and epidemiological information. The expected result is Negative. Fact Sheet for Patients:  PinkCheek.be Fact Sheet for Healthcare Providers:  GravelBags.it This test is not yet ap proved or cleared by the [place] FDA and  has been authorized for detection and/or diagnosis of SARS-CoV-2 by FDA under an Emergency Use Authorization (EUA). This EUA will remain  in effect (meaning this test can be used) for the duration of the COVID-19 declaration under Section 564(b)(1) of the Act, 21 U.S.C. section 360bbb-3(b)(1), unless  the authorization is terminated or revoked sooner.    Influenza A by PCR NEGATIVE NEGATIVE Final   Influenza B by PCR NEGATIVE NEGATIVE Final    Comment: (NOTE) The Xpert Xpress SARS-CoV-2/FLU/RSV assay is intended as an aid in  the diagnosis of influenza from Nasopharyngeal swab specimens and  should not be used as a sole basis for treatment. Nasal washings and  aspirates are unacceptable for Xpert Xpress SARS-CoV-2/FLU/RSV  testing. Fact Sheet for Patients: PinkCheek.be Fact Sheet for Healthcare Providers: GravelBags.it This test is not yet approved or cleared by the [place] FDA and  has been authorized for detection and/or diagnosis of SARS-CoV-2 by  FDA under an Emergency Use Authorization (EUA). This EUA will remain  in effect (meaning this test can be used) for the duration of the  Covid-19 declaration under Section 564(b)(1) of the Act, 21  U.S.C. section 360bbb-3(b)(1), unless the authorization is  terminated or revoked. Performed at [facility], [address] [place], [place] [address]   MRSA PCR Screening     Status: None   Collection Time: 11/06/19  9:10 PM   Specimen: Nasal Mucosa; Nasopharyngeal  Result Value Ref Range Status   MRSA by PCR NEGATIVE NEGATIVE Final    Comment:        The GeneXpert MRSA Assay (FDA approved for NASAL specimens only), is one component of a comprehensive MRSA colonization surveillance program. It is not intended to diagnose MRSA infection nor to guide or monitor treatment for MRSA infections. Performed at [facility], [address] [place], [place] [address]   CULTURE, BLOOD (ROUTINE X 2) w Reflex to ID Panel     Status: None (Preliminary result)   Collection Time: 11/09/19  7:13 PM   Specimen: BLOOD  Result Value Ref Range Status   Specimen Description BLOOD LEFT ANTECUBITAL  Final   Special Requests   Final    BOTTLES DRAWN AEROBIC AND  ANAEROBIC Blood Culture adequate volume   Culture   Final    NO GROWTH < 12 HOURS Performed at [facility], [address] [place], [place] [address]    Report Status PENDING  Incomplete  CULTURE, BLOOD (ROUTINE X 2) w Reflex to ID Panel     Status: None (Preliminary result)   Collection Time: 11/09/19  7:19 PM   Specimen: BLOOD  Result Value Ref Range Status   Specimen Description BLOOD BLOOD LEFT HAND  Final   Special Requests   Final    BOTTLES DRAWN AEROBIC AND ANAEROBIC Blood Culture adequate volume   Culture   Final    NO GROWTH < 12 HOURS Performed at [facility], [address], [place], [place] [address]    Report Status PENDING  Incomplete     Labs: BNP (last 3 results) No results for input(s): BNP in the last 8760 hours. Basic Metabolic Panel: Recent Labs  Lab 11/04/19 0428 11/04/19 0428 11/05/19 0453 11/05/19 0453 11/06/19 0320 11/07/19 0501 11/08/19 0429 11/09/19 0524 11/10/19 0403  NA 132*   < > 135   < > 134* 138 140 136 140  K 3.3*   < > 3.4*   < > 3.6 3.8 3.6 3.6 3.6  CL 98   < > 100   < > 99 105 106 103 107  CO2 29   < > 29   < > 27 28 28 27 24   GLUCOSE 141*   < > 100*   < > 154* 113* 93 126* 180*  BUN 6   < > 8   < > 14 17 13 14 14   CREATININE 0.52*   < > 0.53*   < > 0.73 0.71 0.53* 0.58* 0.69  CALCIUM 7.7*   < > 8.1*   < > 8.2* 8.5* 8.5* 8.6* 8.6*  MG 1.7   < > 1.8  --  1.8 1.9 1.7 1.8  --   PHOS 2.2*  --  3.0  --  3.3 2.6 3.4  --   --    < > = values in this interval not displayed.   Liver Function Tests: Recent Labs  Lab 11/05/19 0453 11/06/19 0320 11/07/19 0501 11/08/19 0429  AST 79* 61* 47* 44*  ALT 40 36 33 30  ALKPHOS 125 128* 117 108  BILITOT 0.8 0.7 0.7 0.7  PROT 5.7* 6.0* 6.3* 6.2*  ALBUMIN 2.4* 2.6* 2.5* 2.6*   No results for input(s): LIPASE, AMYLASE in the last 168 hours. No results for input(s): AMMONIA in the last 168 hours. CBC: Recent Labs  Lab 11/05/19 0453 11/06/19 0320 11/07/19 0501  11/08/19 0429 11/10/19 0404  WBC 4.5 5.7 6.9 5.6 5.4  NEUTROABS 3.0  --   --   --  3.1  HGB 8.7* 9.4* 9.4* 9.0* 8.7*  HCT 26.5* 28.4* 28.9* 27.2* 28.0*  MCV 95.0 95.0 97.0 96.5 101.8*  PLT 83* 100* 127* 135* 174   Cardiac Enzymes: No results for input(s): CKTOTAL, CKMB, CKMBINDEX, TROPONINI in the last 168 hours. BNP: Invalid input(s): POCBNP CBG: No results for input(s): GLUCAP in the last 168 hours. D-Dimer No results for input(s): DDIMER in the last 72 hours. Hgb A1c No results for input(s): HGBA1C in the last 72 hours. Lipid Profile No results for input(s): CHOL, HDL, LDLCALC, TRIG, CHOLHDL, LDLDIRECT in the last 72 hours. Thyroid function studies No results for input(s): TSH, T4TOTAL, T3FREE, THYROIDAB in the last 72 hours.  Invalid input(s): FREET3 Anemia work up No results for input(s): VITAMINB12, FOLATE, FERRITIN, TIBC, IRON, RETICCTPCT in the last 72 hours. Urinalysis    Component Value Date/Time   COLORURINE STRAW (A) 11/01/2019 1915   APPEARANCEUR CLEAR (A) 11/01/2019 1915   LABSPEC 1.002 (L) 11/01/2019 1915   PHURINE 5.0 11/01/2019 1915   GLUCOSEU NEGATIVE 11/01/2019 1915   HGBUR NEGATIVE 11/01/2019 1915  BILIRUBINUR NEGATIVE 11/01/2019 1915   KETONESUR NEGATIVE 11/01/2019 1915   PROTEINUR NEGATIVE 11/01/2019 1915   NITRITE NEGATIVE 11/01/2019 1915   LEUKOCYTESUR NEGATIVE 11/01/2019 1915   Sepsis Labs Invalid input(s): PROCALCITONIN,  WBC,  LACTICIDVEN Microbiology Recent Results (from the past 240 hour(s))  Culture, blood (routine x 2)     Status: None   Collection Time: 11/01/19  6:55 PM   Specimen: BLOOD  Result Value Ref Range Status   Specimen Description BLOOD RIGHT ANTECUBITAL  Final   Special Requests   Final    BOTTLES DRAWN AEROBIC AND ANAEROBIC Blood Culture results may not be optimal due to an excessive volume of blood received in culture bottles   Culture   Final    NO GROWTH 5 DAYS Performed at [facility], [address] [place], [place] [address]    Report Status 11/06/2019 FINAL  Final  Culture, blood (routine x 2)     Status: None   Collection Time: 11/01/19  7:15 PM   Specimen: BLOOD  Result Value Ref Range Status   Specimen Description BLOOD LEFT ANTECUBITAL  Final   Special Requests   Final    BOTTLES DRAWN AEROBIC AND ANAEROBIC Blood Culture results may not be optimal due to an excessive volume of blood received in culture bottles   Culture   Final    NO GROWTH 5 DAYS Performed at [facility], [address] [place], [place] [address]    Report Status 11/06/2019 FINAL  Final  Respiratory Panel by RT PCR (Flu A&B, Covid) - Nasopharyngeal Swab     Status: None   Collection Time: 11/01/19  7:15 PM   Specimen: Nasopharyngeal Swab  Result Value Ref Range Status   SARS Coronavirus 2 by RT PCR NEGATIVE NEGATIVE Final    Comment: (NOTE) SARS-CoV-2 target nucleic acids are NOT DETECTED. The SARS-CoV-2 RNA is generally detectable in upper respiratoy specimens during the acute phase of infection. The lowest concentration of SARS-CoV-2 viral copies this assay can detect is 131 copies/mL. A negative result does not preclude SARS-Cov-2 infection and should not be used as the sole basis for treatment or other patient management decisions. A negative result may occur with  improper specimen collection/handling, submission of specimen other than nasopharyngeal swab, presence of viral mutation(s) within the areas targeted by this assay, and inadequate number of viral copies (<131 copies/mL). A negative result must be combined with clinical observations, patient history, and epidemiological information. The expected result is Negative. Fact Sheet for Patients:  https://www.moore.com/https://www.fda.gov/media/142436/download Fact Sheet for Healthcare Providers:  https://www.young.biz/https://www.fda.gov/media/142435/download This test is not yet ap proved or cleared by the [place] FDA and  has been authorized for detection  and/or diagnosis of SARS-CoV-2 by FDA under an Emergency Use Authorization (EUA). This EUA will remain  in effect (meaning this test can be used) for the duration of the COVID-19 declaration under Section 564(b)(1) of the Act, 21 U.S.C. section 360bbb-3(b)(1), unless the authorization is terminated or revoked sooner.    Influenza A by PCR NEGATIVE NEGATIVE Final   Influenza B by PCR NEGATIVE NEGATIVE Final    Comment: (NOTE) The Xpert Xpress SARS-CoV-2/FLU/RSV assay is intended as an aid in  the diagnosis of influenza from Nasopharyngeal swab specimens and  should not be used as a sole basis for treatment. Nasal washings and  aspirates are unacceptable for Xpert Xpress SARS-CoV-2/FLU/RSV  testing. Fact Sheet for Patients: https://www.moore.com/https://www.fda.gov/media/142436/download Fact Sheet for Healthcare Providers: https://www.young.biz/https://www.fda.gov/media/142435/download This test is not yet approved  or cleared by the [place] and  has been authorized for detection and/or diagnosis of SARS-CoV-2 by  FDA under an Emergency Use Authorization (EUA). This EUA will remain  in effect (meaning this test can be used) for the duration of the  Covid-19 declaration under Section 564(b)(1) of the Act, 21  U.S.C. section 360bbb-3(b)(1), unless the authorization is  terminated or revoked. Performed at [facility], [address] [place], [place] [address]   MRSA PCR Screening     Status: None   Collection Time: 11/06/19  9:10 PM   Specimen: Nasal Mucosa; Nasopharyngeal  Result Value Ref Range Status   MRSA by PCR NEGATIVE NEGATIVE Final    Comment:        The GeneXpert MRSA Assay (FDA approved for NASAL specimens only), is one component of a comprehensive MRSA colonization surveillance program. It is not intended to diagnose MRSA infection nor to guide or monitor treatment for MRSA infections. Performed at [facility], [address] [place], [place] [address]   CULTURE, BLOOD  (ROUTINE X 2) w Reflex to ID Panel     Status: None (Preliminary result)   Collection Time: 11/09/19  7:13 PM   Specimen: BLOOD  Result Value Ref Range Status   Specimen Description BLOOD LEFT ANTECUBITAL  Final   Special Requests   Final    BOTTLES DRAWN AEROBIC AND ANAEROBIC Blood Culture adequate volume   Culture   Final    NO GROWTH < 12 HOURS Performed at [facility], [address], [place], [place] [address]    Report Status PENDING  Incomplete  CULTURE, BLOOD (ROUTINE X 2) w Reflex to ID Panel     Status: None (Preliminary result)   Collection Time: 11/09/19  7:19 PM   Specimen: BLOOD  Result Value Ref Range Status   Specimen Description BLOOD BLOOD LEFT HAND  Final   Special Requests   Final    BOTTLES DRAWN AEROBIC AND ANAEROBIC Blood Culture adequate volume   Culture   Final    NO GROWTH < 12 HOURS Performed at [facility], [address], [place], [place] [address]    Report Status PENDING  Incomplete     Time coordinating discharge: Over 30 minutes  SIGNED:   Pennie Banter, DO [facility] 11/10/2019, 9:13 AM   If 7PM-7AM, please contact night-coverage [email or web address]

## 2019-11-10 NOTE — Progress Notes (Signed)
BP elevated. NP notified. Will continue to monitor.

## 2019-11-11 ENCOUNTER — Telehealth: Payer: Self-pay | Admitting: General Practice

## 2019-11-11 NOTE — Telephone Encounter (Signed)
I called Mr. Sawnn at 9:52 on 3/2 to go over the application process and answer any questions he may have. Phone call went straight to voicemail and I was able to leave a message

## 2019-11-14 LAB — CULTURE, BLOOD (ROUTINE X 2)
Culture: NO GROWTH
Culture: NO GROWTH
Special Requests: ADEQUATE
Special Requests: ADEQUATE

## 2019-12-17 ENCOUNTER — Other Ambulatory Visit: Payer: Self-pay

## 2019-12-17 ENCOUNTER — Emergency Department
Admission: EM | Admit: 2019-12-17 | Discharge: 2019-12-18 | Disposition: A | Discharge status: Home / Self Care | Payer: Self-pay | Attending: Emergency Medicine | Admitting: Emergency Medicine

## 2019-12-17 ENCOUNTER — Emergency Department: Payer: Self-pay

## 2019-12-17 DIAGNOSIS — F17210 Nicotine dependence, cigarettes, uncomplicated: Secondary | ICD-10-CM | POA: Insufficient documentation

## 2019-12-17 DIAGNOSIS — Y908 Blood alcohol level of 240 mg/100 ml or more: Secondary | ICD-10-CM | POA: Insufficient documentation

## 2019-12-17 DIAGNOSIS — Z79899 Other long term (current) drug therapy: Secondary | ICD-10-CM | POA: Insufficient documentation

## 2019-12-17 DIAGNOSIS — R519 Headache, unspecified: Secondary | ICD-10-CM | POA: Insufficient documentation

## 2019-12-17 DIAGNOSIS — F1010 Alcohol abuse, uncomplicated: Secondary | ICD-10-CM | POA: Insufficient documentation

## 2019-12-17 LAB — BASIC METABOLIC PANEL
Anion gap: 13 (ref 5–15)
BUN: 14 mg/dL (ref 6–20)
CO2: 24 mmol/L (ref 22–32)
Calcium: 8 mg/dL — ABNORMAL LOW (ref 8.9–10.3)
Chloride: 99 mmol/L (ref 98–111)
Creatinine, Ser: 0.81 mg/dL (ref 0.61–1.24)
GFR calc Af Amer: >60 mL/min (ref >60)
GFR calc non Af Amer: >60 mL/min (ref >60)
Glucose, Bld: 189 mg/dL — ABNORMAL HIGH (ref 70–99)
Potassium: 2.9 mmol/L — ABNORMAL LOW (ref 3.5–5.1)
Sodium: 136 mmol/L (ref 135–145)

## 2019-12-17 LAB — CBC
HCT: 29.1 % — ABNORMAL LOW (ref 39.0–52.0)
Hemoglobin: 9.8 g/dL — ABNORMAL LOW (ref 13.0–17.0)
MCH: 28.6 pg (ref 26.0–34.0)
MCHC: 33.7 g/dL (ref 30.0–36.0)
MCV: 84.8 fL (ref 80.0–100.0)
Platelets: 144 10*3/uL — ABNORMAL LOW (ref 150–400)
RBC: 3.43 MIL/uL — ABNORMAL LOW (ref 4.22–5.81)
RDW: 16.5 % — ABNORMAL HIGH (ref 11.5–15.5)
WBC: 4.1 10*3/uL (ref 4.0–10.5)
nRBC: 0 % (ref 0.0–0.2)

## 2019-12-17 LAB — ETHANOL: Alcohol, Ethyl (B): 434 mg/dL (ref <10)

## 2019-12-17 MED ORDER — LORAZEPAM 0.5 MG PO TABS
0.5000 mg | ORAL_TABLET | Freq: Once | ORAL | Status: DC
  Filled 2019-12-17: qty 1

## 2019-12-17 MED ORDER — THIAMINE HCL 100 MG PO TABS
100.0000 mg | ORAL_TABLET | Freq: Once | ORAL | Status: AC
  Administered 2019-12-17: 100 mg via ORAL
  Filled 2019-12-17: qty 1

## 2019-12-17 NOTE — ED Provider Notes (Signed)
[facility] Emergency Department Provider Note    First MD Initiated Contact with Patient 12/17/19 1859     (approximate)  I have reviewed the triage vital signs and the nursing notes.   HISTORY  Chief Complaint Fall  Level v Caveat:  intoxication  HPI Terry Clark is a 49 y.o. male extensive history of alcohol abuse presents the ER for possible seizure-like activity.  Patient arrives stating that he needs Ativan.  Would not provide any details as to how much he is been drinking today but smells heavily of alcohol.  States he does have a headache.  Denies any other complaints.    Past Medical History:  Diagnosis Date  . Alcohol abuse   . Alcohol withdrawal seizure (HCC)    Family History  Problem Relation Age of Onset  . Kidney failure Mother   . Cancer Other    Past Surgical History:  Procedure Laterality Date  . ESOPHAGOGASTRODUODENOSCOPY N/A 09/18/2018   Procedure: ESOPHAGOGASTRODUODENOSCOPY (EGD);  Surgeon: Toney Reil, MD;  Location: [facility] ENDOSCOPY;  Service: Gastroenterology;  Laterality: N/A;  . TONSILLECTOMY     Patient Active Problem List   Diagnosis Date Noted  . Fever 11/09/2019  . Acute metabolic encephalopathy 11/05/2019  . Acute anemia 11/05/2019  . Thrombocytopenia (HCC) 11/05/2019  . Hypomagnesemia 11/05/2019  . Hypokalemia 11/05/2019  . Hypophosphatemia 11/05/2019  . Transaminitis 11/05/2019  . Hypothermia 11/01/2019  . Perceptual disturbances and seizures concurrent with and due to alcohol withdrawal (HCC) 05/26/2019  . Alcohol withdrawal (HCC) 02/26/2019  . Hematemesis   . GI bleed 09/17/2018  . Alcoholic intoxication with complication (HCC)   . Anger   . Subconjunctival hemorrhage of both eyes   . Alcohol withdrawal seizure (HCC) 06/13/2014  . 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 Authorizing Provider  amLODipine (NORVASC) 5  MG tablet Take 2 tablets (10 mg total) by mouth daily. 11/10/19   Pennie Banter, DO  folic acid (FOLVITE) 1 MG tablet Take 1 tablet (1 mg total) by mouth daily. 06/02/19   Auburn Bilberry, MD  gabapentin (NEURONTIN) 300 MG capsule Take 3 capsules (900 mg total) by mouth 3 (three) times daily. 11/10/19   Pennie Banter, DO  Multiple Vitamin (MULTIVITAMIN WITH MINERALS) TABS tablet Take 1 tablet by mouth daily. 06/02/19   Auburn Bilberry, MD  pantoprazole (PROTONIX) 40 MG tablet Take 1 tablet (40 mg total) by mouth daily. 11/10/19 03/09/20  [name] A, DO  thiamine 100 MG tablet Take 1 tablet (100 mg total) by mouth daily. 06/02/19   Auburn Bilberry, MD  thiamine 100 MG tablet Take 1 tablet (100 mg total) by mouth daily. 11/10/19   Pennie Banter, DO    Allergies Patient has no known allergies.    Social History Social History   Tobacco Use  . Smoking status: Current Every Day Smoker    Packs/day: 1.00  . Smokeless tobacco: Never Used  Substance Use Topics  . Alcohol use: Yes    Comment: case of beer and fifth of liquor daily  . Drug use: Not Currently    Review of Systems Patient denies headaches, rhinorrhea, blurry vision, numbness, shortness of breath, chest pain, edema, cough, abdominal pain, nausea, vomiting, diarrhea, dysuria, fevers, rashes or hallucinations unless otherwise stated above in HPI. ____________________________________________   PHYSICAL EXAM:  VITAL SIGNS: Vitals:   12/17/19 1834 12/17/19 2000  BP: 119/72  115/89  Pulse: 88 81  Resp: 20 16  Temp: 98.5 F (36.9 C)   SpO2: 98% 99%    Constitutional: intoxicated appearing, protecting his airway Eyes: Conjunctivae are normal.  Head: Atraumatic. Nose: No congestion/rhinnorhea. Mouth/Throat: Mucous membranes are moist.   Neck: No stridor. Painless ROM.  Cardiovascular: Normal rate, regular rhythm. Grossly normal heart sounds.  Good peripheral circulation. Respiratory: Normal respiratory effort.  No  retractions. Lungs CTAB. Gastrointestinal: Soft and nontender. No distention. No abdominal bruits. No CVA tenderness. Genitourinary:  Musculoskeletal: No lower extremity tenderness nor edema.  No joint effusions. Neurologic:  slurred speech and language. MAE spontaneously.  No gross focal neurologic deficits are appreciated. No facial droop Skin:  Skin is warm, dry and intact. No rash noted. Psychiatric: calm and cooperative  ____________________________________________   LABS (all labs ordered are listed, but only abnormal results are displayed)  Results for orders placed or performed during the hospital encounter of 12/17/19 (from the past 24 hour(s))  Basic metabolic panel - if new onset seizures     Status: Abnormal   Collection Time: 12/17/19  6:44 PM  Result Value Ref Range   Sodium 136 135 - 145 mmol/L   Potassium 2.9 (L) 3.5 - 5.1 mmol/L   Chloride 99 98 - 111 mmol/L   CO2 24 22 - 32 mmol/L   Glucose, Bld 189 (H) 70 - 99 mg/dL   BUN 14 6 - 20 mg/dL   Creatinine, Ser 4.85 0.61 - 1.24 mg/dL   Calcium 8.0 (L) 8.9 - 10.3 mg/dL   GFR calc non Af Amer >60 >60 mL/min   GFR calc Af Amer >60 >60 mL/min   Anion gap 13 5 - 15  CBC - if new onset seizures     Status: Abnormal   Collection Time: 12/17/19  6:44 PM  Result Value Ref Range   WBC 4.1 4.0 - 10.5 K/uL   RBC 3.43 (L) 4.22 - 5.81 MIL/uL   Hemoglobin 9.8 (L) 13.0 - 17.0 g/dL   HCT 46.2 (L) 70.3 - 50.0 %   MCV 84.8 80.0 - 100.0 fL   MCH 28.6 26.0 - 34.0 pg   MCHC 33.7 30.0 - 36.0 g/dL   RDW 93.8 (H) 18.2 - 99.3 %   Platelets 144 (L) 150 - 400 K/uL   nRBC 0.0 0.0 - 0.2 %  Ethanol     Status: Abnormal   Collection Time: 12/17/19  6:44 PM  Result Value Ref Range   Alcohol, Ethyl (B) 434 (HH) <10 mg/dL   ____________________________________________ ____________________________________________  RADIOLOGY  I personally reviewed all radiographic images ordered to evaluate for the above acute complaints and reviewed  radiology reports and findings.  These findings were personally discussed with the patient.  Please see medical record for radiology report.  ____________________________________________   PROCEDURES  Procedure(s) performed:  Procedures    Critical Care performed: no ____________________________________________   INITIAL IMPRESSION / ASSESSMENT AND PLAN / ED COURSE  Pertinent labs & imaging results that were available during my care of the patient were reviewed by me and considered in my medical decision making (see chart for details).   DDX: etoh abuse, sdh, sah, iph, concussion, seizure  Jestin T Tout is a 49 y.o. who presents to the ED with alcohol intoxication with possible seizure-like activity.  On review of records patient has had withdrawal seizures but he appears clinically intoxicated.  Somewhat of a poor historian given his acute intoxication.  He is currently calm and cooperative.  Blood will be checked as well as ordering CT imaging to exclude bleed or acute intracranial abnormality.  Will be observed in the ER on monitor.     The patient was evaluated in Emergency Department today for the symptoms described in the history of present illness. He/she was evaluated in the context of the global COVID-19 pandemic, which necessitated consideration that the patient might be at risk for infection with the SARS-CoV-2 virus that causes COVID-19. Institutional protocols and algorithms that pertain to the evaluation of patients at risk for COVID-19 are in a state of rapid change based on information released by regulatory bodies including the CDC and federal and state organizations. These policies and algorithms were followed during the patient's care in the ED.  As part of my medical decision making, I reviewed the following data within the [facility] notes reviewed and incorporated, Labs reviewed, notes from prior ED visits and [place] Controlled Substance  Database   ____________________________________________   FINAL CLINICAL IMPRESSION(S) / ED DIAGNOSES  Final diagnoses:  Alcohol abuse      NEW MEDICATIONS STARTED DURING THIS VISIT:  New Prescriptions   No medications on file     Note:  This document was prepared using Dragon voice recognition software and may include unintentional dictation errors.    Merlyn Lot, MD 12/17/19 2118

## 2019-12-17 NOTE — ED Notes (Signed)
Pt transported to CT ?

## 2019-12-17 NOTE — ED Provider Notes (Signed)
-----------------------------------------   11:03 PM on 12/17/2019 -----------------------------------------  Assuming care from Dr. Roxan Hockey.  In short, Terry Clark is a 50 y.o. male with a chief complaint of intoxication.  Refer to the original H&P for additional details.  The current plan of care is to allow to safely sober up and reassess.  Anticipate discharge.    ----------------------------------------- 6:52 AM on 12/18/2019 -----------------------------------------  Tried to discharge the patient overnight once he was clinically sober but he had no one to pick him up.  May be safely discharged this morning assuming he has no signs of alcohol withdrawal and with a sober adult.  Vitals remain stable with a HR of 75 and he is sleeping peacefully.   Loleta Rose, MD 12/18/19 [phone]

## 2019-12-17 NOTE — ED Notes (Signed)
Pt stated to this tech "I am going to need some ativan as soon as possible because I am a severe alcoholic. I am going to have a fucking seizure. Pt keeps closing his eyes like he is going to sleep."

## 2019-12-17 NOTE — ED Triage Notes (Addendum)
Pt comes via [facility] from [place] of [address] with c/o seizure. Pt states he is a heavy drinker. Pt states he needs ativan right now.  Pt states EMS was called because he had a seizure and went unresponsive.  Pt states pain in feet.  Pt states earlier today he had 1/2 a fifth of wine. VSS. Pt seems to be intoxicated and slurring words.

## 2019-12-18 MED ORDER — POTASSIUM CHLORIDE CRYS ER 20 MEQ PO TBCR
40.0000 meq | EXTENDED_RELEASE_TABLET | Freq: Once | ORAL | Status: AC
  Administered 2019-12-18: 40 meq via ORAL
  Filled 2019-12-18: qty 2

## 2019-12-18 NOTE — Discharge Instructions (Signed)
You were seen in the emergency department for alcohol intoxication.  Please seek help from the recommended resources for assistance with your alcohol dependence.  If you have any thoughts of hurting herself or others, please call 911 or return to the emergency department.  Please avoid drug and alcohol use.  Never drive a vehicle or operate machinery while intoxicated.  

## 2019-12-18 NOTE — ED Notes (Signed)
Pt called for ride but unable to obtain one. Pt states he lives in [place] and feels comfortable taking the bus home.  Pt a&o x 4, ambulatory with a steady gait upon d/c, and given a bus pass to obtain transportation home

## 2020-01-16 ENCOUNTER — Telehealth: Payer: Self-pay | Admitting: Pharmacist

## 2020-01-16 NOTE — Telephone Encounter (Signed)
Patient failed to provide requested 2021 financial documentation. No additional medication assistance will be provided by [facility] without the required proof of income documentation. Patient notified by letter Debra Cheek Administrative Assistant [facility] 

## 2020-03-25 ENCOUNTER — Telehealth: Payer: Self-pay | Admitting: General Practice

## 2020-03-25 NOTE — Telephone Encounter (Signed)
Individual now resides in [place] and is ineligible to become a pt in our clinic.

## 2020-04-11 DEATH — deceased

## 2020-08-29 IMAGING — DX DG CHEST 1V PORT
2 series · 2 of 2 positions shown · non-contrast
Comparison: CT chest, abdomen and pelvis July 27, 2018

CLINICAL DATA: Found down, unresponsive.  History of alcohol abuse.

EXAM:
PORTABLE CHEST 1 VIEW

[chest ap (1 of 2)]
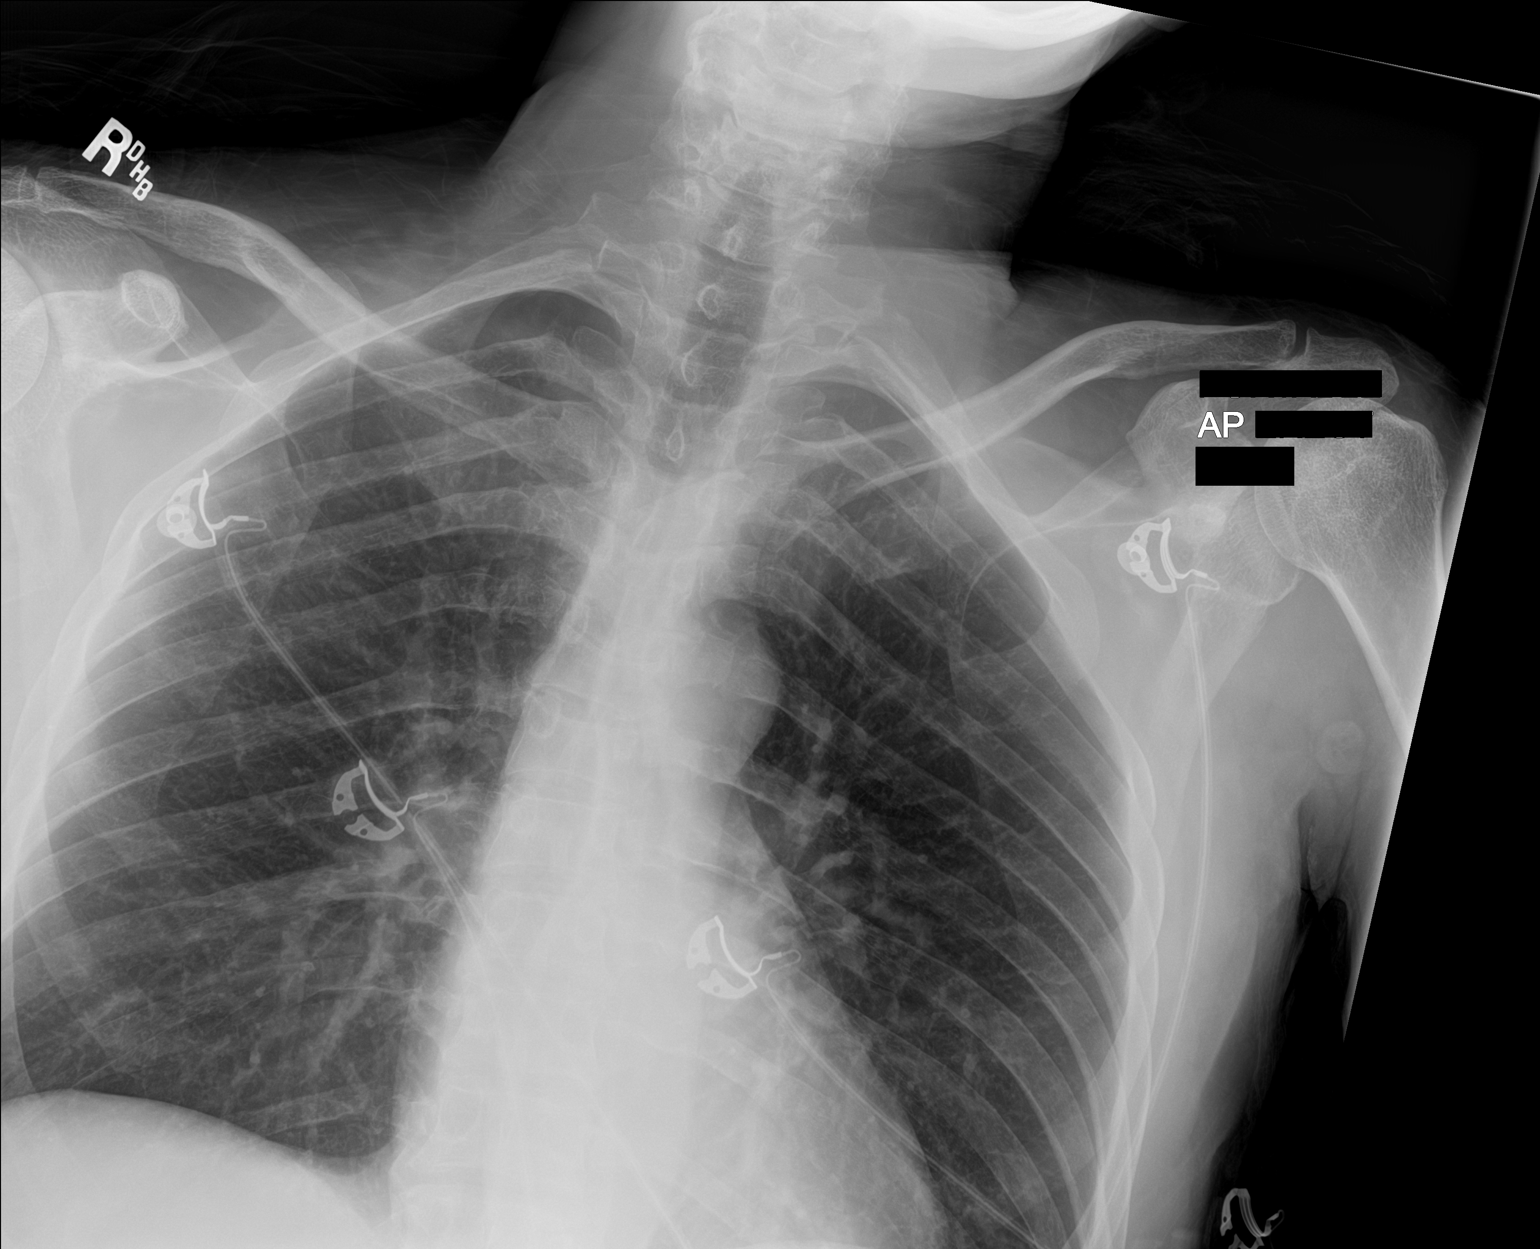

[chest ap (2 of 2)]
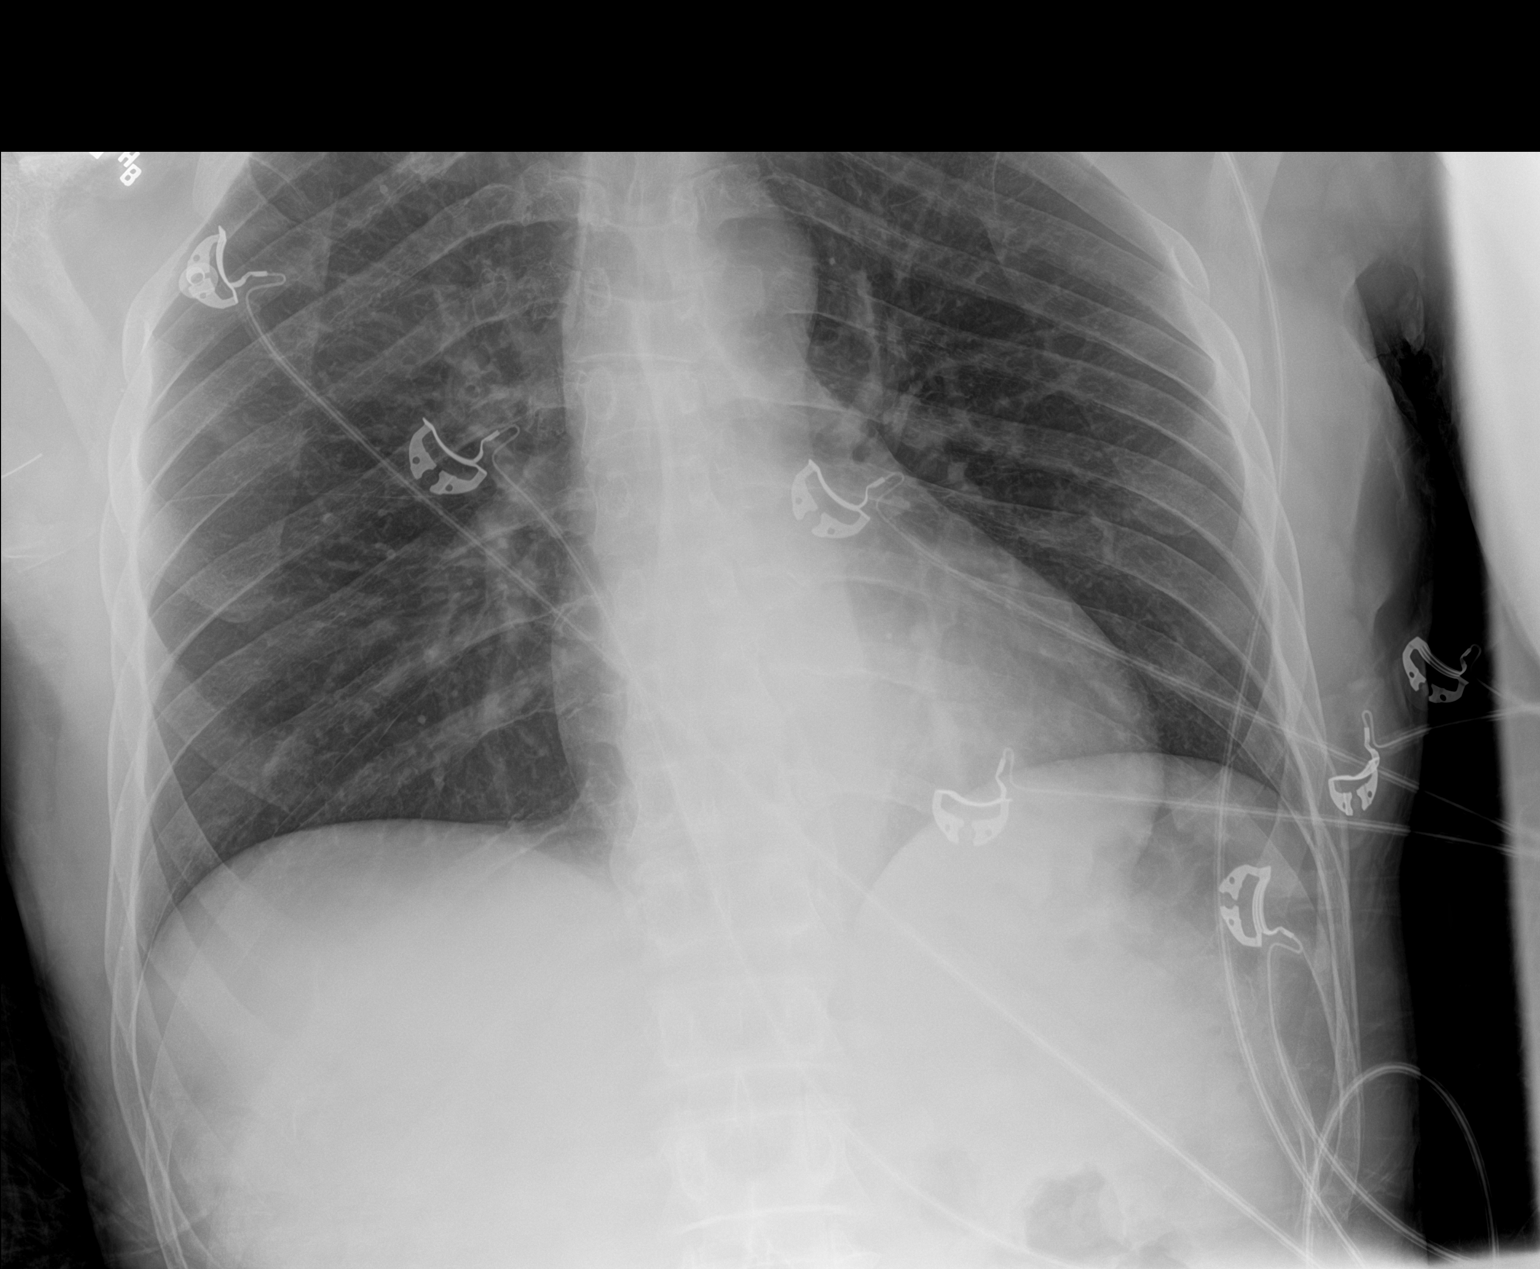

[2 of 2 positions shown; findings below may reference images not displayed]

FINDINGS: Cardiomediastinal silhouette is normal. No pleural effusions or
focal consolidations. Trachea projects midline and there is no
pneumothorax. Soft tissue planes and included osseous structures are
non-suspicious. Multiple EKG lines overlie the patient and may
obscure subtle underlying pathology. Linear radiopaque foreign body
projecting RIGHT axilla.
IMPRESSION: 1. No acute cardiopulmonary process.
2. Linear radiopaque foreign body projecting RIGHT axilla may be
external to the patient, recommend direct inspection.

## 2020-10-01 IMAGING — CR DG ABDOMEN ACUTE W/ 1V CHEST
3 series · 3 of 3 positions shown · non-contrast
Comparison: Chest radiograph on 08/20/2018

CLINICAL DATA: Right-sided chest and abdominal pain.

EXAM:
DG ABDOMEN ACUTE W/ 1V CHEST

[chest pa]
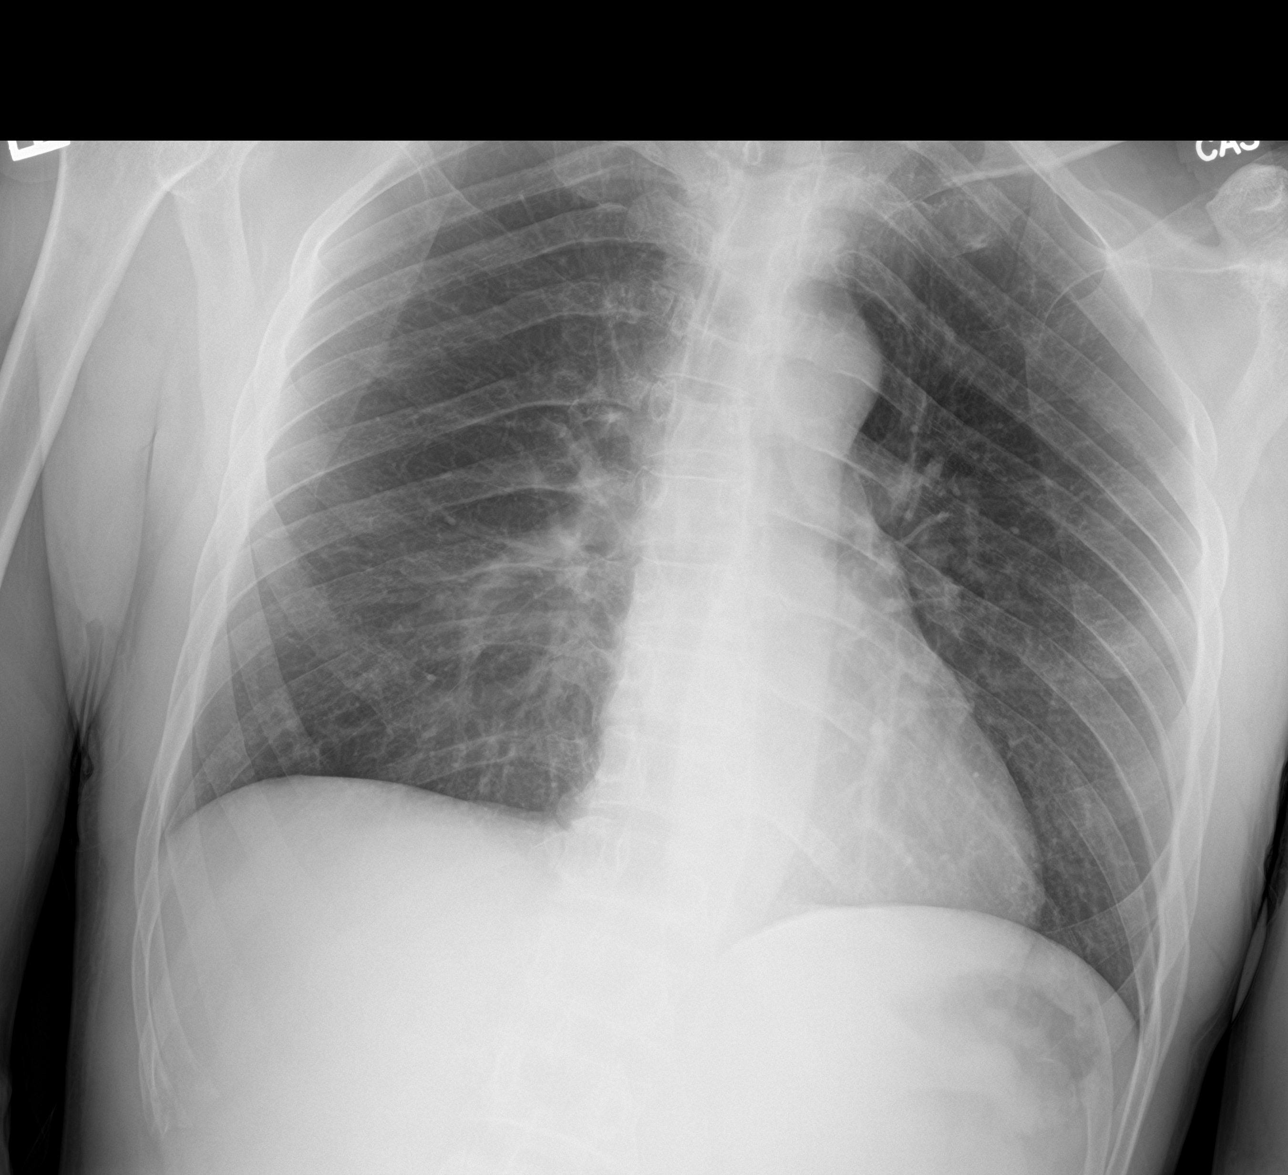

[abdomen erect]
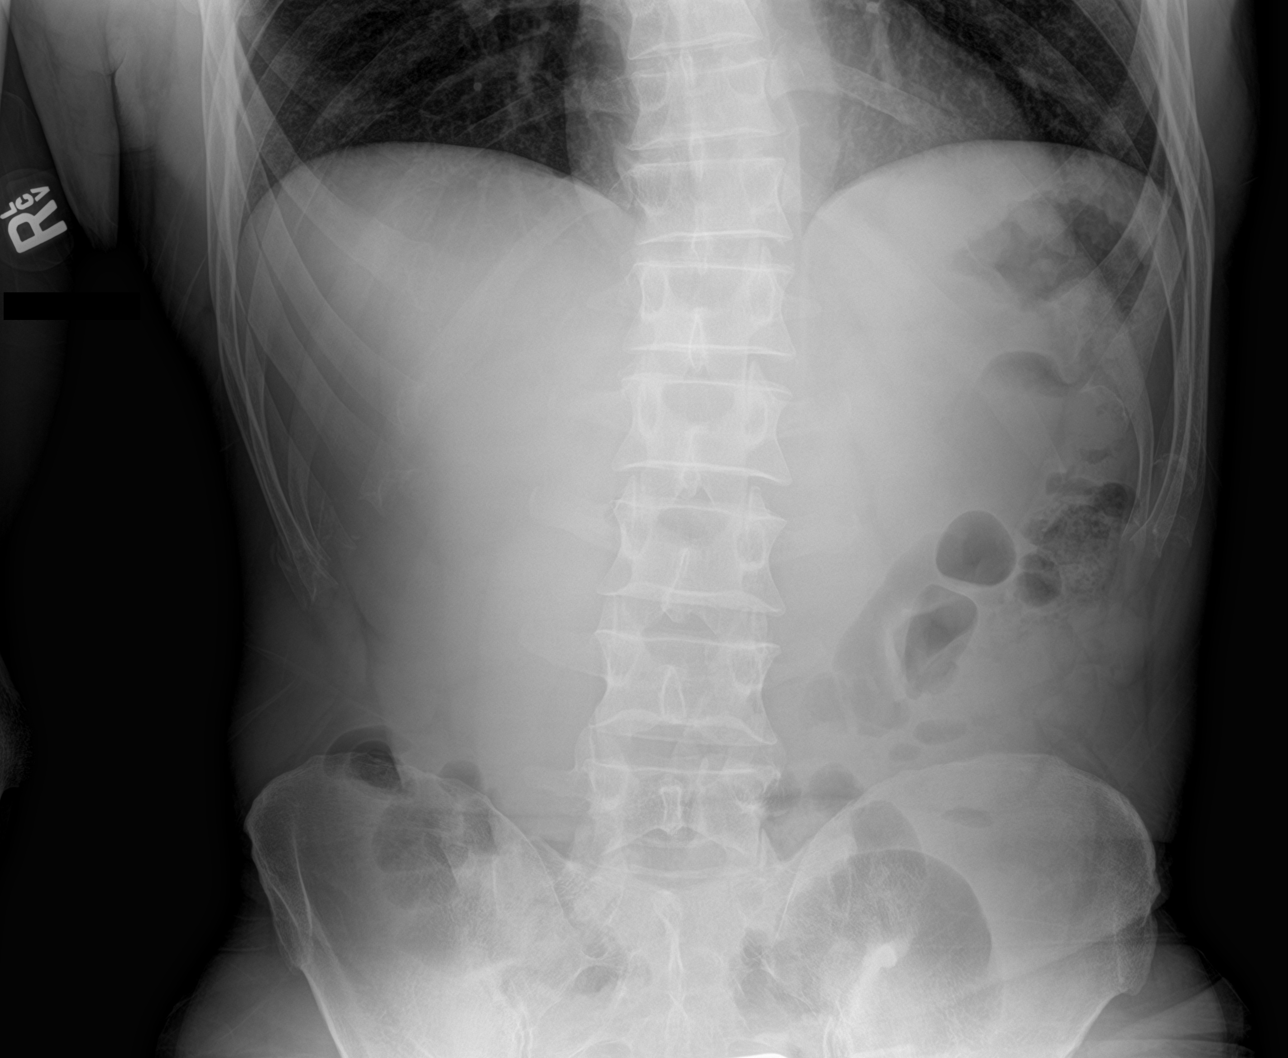

[abdomen supine]
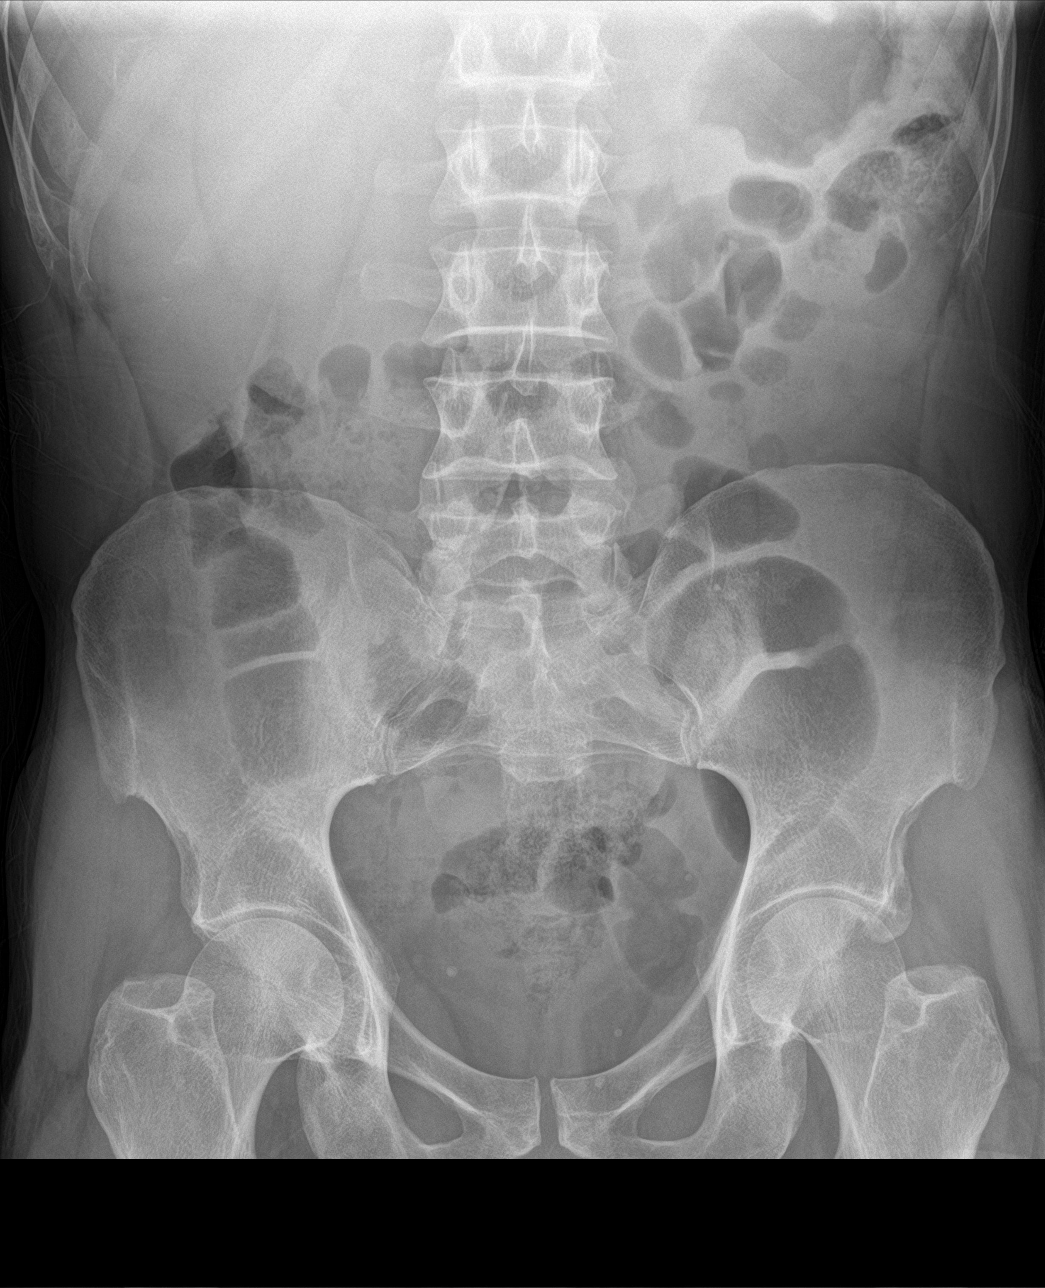

[3 of 3 positions shown; findings below may reference images not displayed]

FINDINGS: There is no evidence of dilated bowel loops or free intraperitoneal
air. No radiopaque calculi or other significant radiographic
abnormality is seen. A few pelvic phleboliths are incidentally
noted.

Heart size and mediastinal contours are within normal limits. Both
lungs are clear. No evidence of pneumothorax or pleural effusion.
IMPRESSION: Negative abdominal radiographs.  No active cardiopulmonary disease.
